# Patient Record
Sex: Male | Born: 1955
Health system: Southern US, Community
[De-identification: ages and names within clinical notes are randomized; demographics above are authoritative.]

## PROBLEM LIST (undated history)

## (undated) DIAGNOSIS — I10 Essential (primary) hypertension: Secondary | ICD-10-CM

## (undated) DIAGNOSIS — IMO0002 Reserved for concepts with insufficient information to code with codable children: Secondary | ICD-10-CM

## (undated) DIAGNOSIS — G4730 Sleep apnea, unspecified: Secondary | ICD-10-CM

## (undated) DIAGNOSIS — J449 Chronic obstructive pulmonary disease, unspecified: Secondary | ICD-10-CM

## (undated) DIAGNOSIS — Z9989 Dependence on other enabling machines and devices: Secondary | ICD-10-CM

## (undated) DIAGNOSIS — E119 Type 2 diabetes mellitus without complications: Secondary | ICD-10-CM

## (undated) DIAGNOSIS — E785 Hyperlipidemia, unspecified: Secondary | ICD-10-CM

## (undated) DIAGNOSIS — R42 Dizziness and giddiness: Secondary | ICD-10-CM

## (undated) HISTORY — DX: Hyperlipidemia, unspecified: E78.5

## (undated) HISTORY — DX: Reserved for concepts with insufficient information to code with codable children: IMO0002

## (undated) HISTORY — DX: Chronic obstructive pulmonary disease, unspecified: J44.9

---

## 2007-09-30 DIAGNOSIS — I152 Hypertension secondary to endocrine disorders: Secondary | ICD-10-CM | POA: Insufficient documentation

## 2007-10-10 DIAGNOSIS — R7301 Impaired fasting glucose: Secondary | ICD-10-CM | POA: Insufficient documentation

## 2008-01-15 ENCOUNTER — Inpatient Hospital Stay: Payer: Self-pay | Admitting: Internal Medicine

## 2008-01-30 ENCOUNTER — Ambulatory Visit: Payer: Self-pay | Admitting: Internal Medicine

## 2008-03-26 HISTORY — PX: SPINAL CORD STIMULATOR REMOVAL: SHX2423

## 2008-04-06 DIAGNOSIS — E1169 Type 2 diabetes mellitus with other specified complication: Secondary | ICD-10-CM | POA: Insufficient documentation

## 2011-09-25 ENCOUNTER — Ambulatory Visit: Payer: Self-pay | Admitting: Family Medicine

## 2011-09-25 DIAGNOSIS — E782 Mixed hyperlipidemia: Secondary | ICD-10-CM | POA: Diagnosis not present

## 2011-09-25 DIAGNOSIS — I10 Essential (primary) hypertension: Secondary | ICD-10-CM | POA: Diagnosis not present

## 2011-09-25 DIAGNOSIS — M5030 Other cervical disc degeneration, unspecified cervical region: Secondary | ICD-10-CM | POA: Diagnosis not present

## 2011-09-25 DIAGNOSIS — M542 Cervicalgia: Secondary | ICD-10-CM | POA: Diagnosis not present

## 2011-09-25 DIAGNOSIS — M47812 Spondylosis without myelopathy or radiculopathy, cervical region: Secondary | ICD-10-CM | POA: Diagnosis not present

## 2011-09-26 DIAGNOSIS — E782 Mixed hyperlipidemia: Secondary | ICD-10-CM | POA: Diagnosis not present

## 2011-09-26 DIAGNOSIS — E119 Type 2 diabetes mellitus without complications: Secondary | ICD-10-CM | POA: Diagnosis not present

## 2013-09-04 DIAGNOSIS — M6281 Muscle weakness (generalized): Secondary | ICD-10-CM | POA: Diagnosis not present

## 2013-09-04 DIAGNOSIS — I10 Essential (primary) hypertension: Secondary | ICD-10-CM | POA: Diagnosis not present

## 2013-09-04 DIAGNOSIS — G90529 Complex regional pain syndrome I of unspecified lower limb: Secondary | ICD-10-CM | POA: Diagnosis not present

## 2013-09-04 LAB — HEPATIC FUNCTION PANEL
ALT: 25 U/L (ref 10–40)
AST: 19 U/L (ref 14–40)
Alkaline Phosphatase: 109 U/L (ref 25–125)
Bilirubin, Total: 0.3 mg/dL

## 2013-09-04 LAB — TSH: TSH: 1.14 u[IU]/mL (ref 0.41–5.90)

## 2013-09-04 LAB — CBC AND DIFFERENTIAL
HCT: 49 % (ref 41–53)
Hemoglobin: 17.1 g/dL (ref 13.5–17.5)
NEUTROS ABS: 8 /uL
Platelets: 345 10*3/uL (ref 150–399)
WBC: 12.2 10*3/mL

## 2013-09-04 LAB — BASIC METABOLIC PANEL
BUN: 0 mg/dL — AB (ref 4–21)
Creatinine: 1 mg/dL (ref 0.6–1.3)
Glucose: 175 mg/dL
Potassium: 4.9 mmol/L (ref 3.4–5.3)
Sodium: 136 mmol/L — AB (ref 137–147)

## 2013-09-08 ENCOUNTER — Ambulatory Visit: Payer: Self-pay | Admitting: Family Medicine

## 2013-09-08 DIAGNOSIS — R51 Headache: Secondary | ICD-10-CM | POA: Diagnosis not present

## 2013-09-08 DIAGNOSIS — R209 Unspecified disturbances of skin sensation: Secondary | ICD-10-CM | POA: Diagnosis not present

## 2013-09-08 DIAGNOSIS — R930 Abnormal findings on diagnostic imaging of skull and head, not elsewhere classified: Secondary | ICD-10-CM | POA: Diagnosis not present

## 2013-09-08 DIAGNOSIS — R29898 Other symptoms and signs involving the musculoskeletal system: Secondary | ICD-10-CM | POA: Diagnosis not present

## 2013-09-17 DIAGNOSIS — M6281 Muscle weakness (generalized): Secondary | ICD-10-CM | POA: Diagnosis not present

## 2013-09-17 DIAGNOSIS — E119 Type 2 diabetes mellitus without complications: Secondary | ICD-10-CM | POA: Diagnosis not present

## 2013-09-17 DIAGNOSIS — I10 Essential (primary) hypertension: Secondary | ICD-10-CM | POA: Diagnosis not present

## 2013-10-02 ENCOUNTER — Ambulatory Visit: Payer: Self-pay | Admitting: Family Medicine

## 2013-10-09 DIAGNOSIS — M5412 Radiculopathy, cervical region: Secondary | ICD-10-CM | POA: Diagnosis not present

## 2013-10-09 DIAGNOSIS — E119 Type 2 diabetes mellitus without complications: Secondary | ICD-10-CM | POA: Diagnosis not present

## 2013-10-09 DIAGNOSIS — I10 Essential (primary) hypertension: Secondary | ICD-10-CM | POA: Diagnosis not present

## 2013-10-09 DIAGNOSIS — M6281 Muscle weakness (generalized): Secondary | ICD-10-CM | POA: Diagnosis not present

## 2014-01-01 DIAGNOSIS — Z23 Encounter for immunization: Secondary | ICD-10-CM | POA: Diagnosis not present

## 2014-01-01 DIAGNOSIS — G9050 Complex regional pain syndrome I, unspecified: Secondary | ICD-10-CM | POA: Diagnosis not present

## 2014-01-01 DIAGNOSIS — E782 Mixed hyperlipidemia: Secondary | ICD-10-CM | POA: Diagnosis not present

## 2014-01-01 DIAGNOSIS — M5412 Radiculopathy, cervical region: Secondary | ICD-10-CM | POA: Diagnosis not present

## 2014-01-01 DIAGNOSIS — I10 Essential (primary) hypertension: Secondary | ICD-10-CM | POA: Diagnosis not present

## 2014-01-01 DIAGNOSIS — E119 Type 2 diabetes mellitus without complications: Secondary | ICD-10-CM | POA: Diagnosis not present

## 2014-01-08 ENCOUNTER — Ambulatory Visit: Payer: Self-pay | Admitting: Family Medicine

## 2014-01-08 DIAGNOSIS — E782 Mixed hyperlipidemia: Secondary | ICD-10-CM | POA: Diagnosis not present

## 2014-01-08 DIAGNOSIS — I10 Essential (primary) hypertension: Secondary | ICD-10-CM | POA: Diagnosis not present

## 2014-01-08 DIAGNOSIS — M542 Cervicalgia: Secondary | ICD-10-CM | POA: Diagnosis not present

## 2014-01-08 DIAGNOSIS — E119 Type 2 diabetes mellitus without complications: Secondary | ICD-10-CM | POA: Diagnosis not present

## 2014-01-08 LAB — HEMOGLOBIN A1C: Hgb A1c MFr Bld: 7.7 % — AB (ref 4.0–6.0)

## 2014-01-08 LAB — LIPID PANEL
CHOLESTEROL: 206 mg/dL — AB (ref 0–200)
HDL: 33 mg/dL — AB (ref 35–70)
LDL CALC: 134 mg/dL
TRIGLYCERIDES: 194 mg/dL — AB (ref 40–160)

## 2014-09-07 ENCOUNTER — Other Ambulatory Visit: Payer: Self-pay | Admitting: Family Medicine

## 2014-09-07 DIAGNOSIS — E119 Type 2 diabetes mellitus without complications: Secondary | ICD-10-CM

## 2014-11-02 ENCOUNTER — Other Ambulatory Visit: Payer: Self-pay | Admitting: Family Medicine

## 2014-12-03 ENCOUNTER — Other Ambulatory Visit: Payer: Self-pay | Admitting: Family Medicine

## 2015-01-08 ENCOUNTER — Other Ambulatory Visit: Payer: Self-pay | Admitting: Family Medicine

## 2015-02-05 ENCOUNTER — Ambulatory Visit (INDEPENDENT_AMBULATORY_CARE_PROVIDER_SITE_OTHER): Payer: Medicare Other

## 2015-02-05 DIAGNOSIS — Z23 Encounter for immunization: Secondary | ICD-10-CM

## 2015-02-08 DIAGNOSIS — M542 Cervicalgia: Secondary | ICD-10-CM | POA: Insufficient documentation

## 2015-02-08 DIAGNOSIS — R040 Epistaxis: Secondary | ICD-10-CM | POA: Insufficient documentation

## 2015-02-08 DIAGNOSIS — E119 Type 2 diabetes mellitus without complications: Secondary | ICD-10-CM | POA: Insufficient documentation

## 2015-02-08 DIAGNOSIS — G90529 Complex regional pain syndrome I of unspecified lower limb: Secondary | ICD-10-CM | POA: Insufficient documentation

## 2015-02-08 DIAGNOSIS — M5412 Radiculopathy, cervical region: Secondary | ICD-10-CM | POA: Insufficient documentation

## 2015-02-10 ENCOUNTER — Other Ambulatory Visit: Payer: Self-pay | Admitting: Family Medicine

## 2015-02-11 ENCOUNTER — Ambulatory Visit (INDEPENDENT_AMBULATORY_CARE_PROVIDER_SITE_OTHER): Payer: Medicare Other | Admitting: Family Medicine

## 2015-02-11 ENCOUNTER — Encounter: Payer: Self-pay | Admitting: Family Medicine

## 2015-02-11 VITALS — BP 148/92 | HR 108 | Temp 97.6°F | Resp 20 | Wt 214.2 lb

## 2015-02-11 DIAGNOSIS — G9050 Complex regional pain syndrome I, unspecified: Secondary | ICD-10-CM

## 2015-02-11 DIAGNOSIS — E782 Mixed hyperlipidemia: Secondary | ICD-10-CM

## 2015-02-11 DIAGNOSIS — I10 Essential (primary) hypertension: Secondary | ICD-10-CM

## 2015-02-11 DIAGNOSIS — IMO0002 Reserved for concepts with insufficient information to code with codable children: Secondary | ICD-10-CM

## 2015-02-11 DIAGNOSIS — E1100 Type 2 diabetes mellitus with hyperosmolarity without nonketotic hyperglycemic-hyperosmolar coma (NKHHC): Secondary | ICD-10-CM | POA: Diagnosis not present

## 2015-02-11 NOTE — Progress Notes (Signed)
Patient ID: Patrick Grant, male   DOB: June 30, 1955, 59 y.o.   MRN: [id number]    Subjective:  Diabetes He presents for his follow-up diabetic visit. He has type 2 diabetes mellitus. His disease course has been stable. Pertinent negatives for hypoglycemia include no speech difficulty, sweats or tremors. Associated symptoms include visual change. Pertinent negatives for diabetes include no polydipsia, no polyuria and no weight loss. (Left eye poor vision after hospitalization for ketoacidosis. Has a history of reflex sympathetic dystrophy following an accident injuring the left foot.) Pertinent negatives for hypoglycemia complications include no blackouts. Symptoms are stable. His weight is decreasing steadily. He is following a diabetic diet. His breakfast blood glucose is taken between 7-8 am. His breakfast blood glucose range is generally 110-130 mg/dl. Eye exam is current.  Hypertension This is a chronic problem. The current episode started more than 1 year ago. The problem has been waxing and waning since onset. The problem is controlled. Pertinent negatives include no palpitations, shortness of breath or sweats. Risk factors for coronary artery disease include diabetes mellitus and male gender.     Prior to Admission medications   Medication Sig Start Date End Date Taking? Authorizing Provider  lisinopril (PRINIVIL,ZESTRIL) 10 MG tablet TAKE ONE TABLET BY MOUTH ONCE DAILY* DUE FOR BLOOD PRESSURE RECHECK IN THE NEXT 4-6 WEEKS* 02/10/15  Yes Vickki Muff Chrismon, PA  metFORMIN (GLUCOPHAGE) 500 MG tablet TAKE ONE TABLET BY MOUTH TWICE DAILY* NEEDS APPOINTMENT* 02/10/15  Yes Margo Common, PA   Past Surgical History  Procedure Laterality Date  . Spinal cord stimulator removal  2010    DUE TO LOSS OF MUSCLE USE WHEN ACTIVATED   Patient Active Problem List   Diagnosis Date Noted  . Cervical pain 02/08/2015  . Cervical nerve root disorder 02/08/2015  . Complex regional pain syndrome  02/08/2015  . Bleeding from the nose 02/08/2015  . Reflex sympathetic dystrophy of lower extremity 02/08/2015  . Type 2 diabetes mellitus ([place]) 02/08/2015  . Combined fat and carbohydrate induced hyperlipemia 04/06/2008  . Elevated fasting blood sugar 10/10/2007  . Essential (primary) hypertension 09/30/2007    History reviewed. No pertinent past medical history.  Social History   Social History  . Marital Status: Single    Spouse Name: N/A  . Number of Children: N/A  . Years of Education: N/A   Occupational History  . Not on file.   Social History Main Topics  . Smoking status: Current Every Day Smoker -- 25 years    Types: Cigars  . Smokeless tobacco: Never Used  . Alcohol Use: No  . Drug Use: No  . Sexual Activity: Not on file   Other Topics Concern  . Not on file   Social History Narrative    Allergies  Allergen Reactions  . Fentanyl     Other reaction(s): Other (See Comments) Other Reaction: Other reaction    Review of Systems  Constitutional: Negative.  Negative for weight loss.  HENT: Negative.   Eyes: Negative.   Respiratory: Negative.  Negative for shortness of breath.   Cardiovascular: Negative.  Negative for palpitations.  Gastrointestinal: Negative.   Genitourinary: Negative.   Musculoskeletal: Negative.   Skin: Negative.   Neurological: Negative for tremors and speech difficulty.       Continued chronic pain in left leg, foot and arm. Increased in pain to try to reach up to shoulder with the left arm or flex left knee. All the left side of body has hypersensitivity and  reacts to barometric changes in weather. Has been on multiple narcotic and pain medications in the past but decided to stop all of it due to side effects. Pain disturbing sleep now unless he uses a heating blanket.  Endo/Heme/Allergies: Negative.  Negative for polydipsia.  Psychiatric/Behavioral: Negative.     Immunization History  Administered Date(s) Administered  .  Influenza,inj,Quad PF,36+ Mos 02/05/2015   Objective:  BP 148/92 mmHg  Pulse 108  Temp(Src) 97.6 F (36.4 C) (Oral)  Resp 20  Wt 214 lb 3.2 oz (97.16 kg)  SpO2 96%  Physical Exam  Constitutional: He is oriented to person, place, and time and well-developed, well-nourished, and in no distress.  HENT:  Head: Normocephalic.  Eyes: Conjunctivae and EOM are normal.  Very little light and dark distinction in the vision of the left eye.  Neck:  Stiffness and decrease range of motion.  Cardiovascular: Normal rate and regular rhythm.   Pulmonary/Chest: Effort normal and breath sounds normal.  Abdominal: Bowel sounds are normal.  Musculoskeletal: He exhibits tenderness.  Hypersensitivity to skin of left side of body. Decreased ROM of the left arm and leg due to severe pain.  Neurological: He is alert and oriented to person, place, and time.  Skin:  Warm and palms sweat a great deal with history of RSD.  Psychiatric: Affect and judgment normal.    Lab Results  Component Value Date   WBC 12.2 09/04/2013   HGB 17.1 09/04/2013   HCT 49 09/04/2013   PLT 345 09/04/2013   CHOL 206* 01/08/2014   TRIG 194* 01/08/2014   HDL 33* 01/08/2014   LDLCALC 134 01/08/2014   TSH 1.14 09/04/2013   HGBA1C 7.7* 01/08/2014    CMP     Component Value Date/Time   NA 136* 09/04/2013   K 4.9 09/04/2013   BUN 0* 09/04/2013   CREATININE 1.0 09/04/2013   AST 19 09/04/2013   ALT 25 09/04/2013   ALKPHOS 109 09/04/2013    Assessment and Plan :  1. Type 2 diabetes mellitus with hyperosmolarity without coma, without long-term current use of insulin (HCC) Essentially good control on the Metformin 500 mg BID with diet. Unable to exercise any due to complex regional pain syndrome. Will get routine labs and follow up pending reports. - CBC with Differential/Platelet - COMPLETE METABOLIC PANEL WITH GFR - Hemoglobin A1c  2. Essential (primary) hypertension BP slightly elevated today with flare of  regional pain syndrome. Tolerating Lisinopril 10 mg qd without side effects. Recheck labs and follow up pending reports.  - TSH  3. Combined fat and carbohydrate induced hyperlipemia Tolerating Pravastatin without side effects. Trying to watch fats in diet. Recheck lipid panel and CMP. - Lipid panel  4. Complex regional pain syndrome Continues to have chronic pain in the left foot, leg and arm and hypersensitivity in the left side of his body that worsens with barometric changes in weather. Has been on multiple narcotic and pain medications in the past but stopped all of them due to side effect issues and feeling "drugged". Continues to use crutches for ambulation and is disabled from gainful employment.   [facility] 02/11/2015 11:08 AM

## 2015-02-13 ENCOUNTER — Other Ambulatory Visit: Payer: Self-pay | Admitting: Family Medicine

## 2015-02-21 ENCOUNTER — Other Ambulatory Visit: Payer: Self-pay | Admitting: Family Medicine

## 2015-02-21 DIAGNOSIS — I10 Essential (primary) hypertension: Secondary | ICD-10-CM | POA: Diagnosis not present

## 2015-02-21 DIAGNOSIS — E1100 Type 2 diabetes mellitus with hyperosmolarity without nonketotic hyperglycemic-hyperosmolar coma (NKHHC): Secondary | ICD-10-CM | POA: Diagnosis not present

## 2015-02-21 DIAGNOSIS — E782 Mixed hyperlipidemia: Secondary | ICD-10-CM | POA: Diagnosis not present

## 2015-02-21 NOTE — Telephone Encounter (Signed)
Patient needs refill on ONE TOUCH ULTRA TEST test strip   [facility] [address]

## 2015-02-21 NOTE — Telephone Encounter (Signed)
Requested received from [facility] requesting a new RX for One touch ultra blue test strip with ICD-10 code on it.

## 2015-02-22 LAB — LIPID PANEL
CHOL/HDL RATIO: 6.1 ratio — AB (ref 0.0–5.0)
CHOLESTEROL TOTAL: 212 mg/dL — AB (ref 100–199)
HDL: 35 mg/dL — ABNORMAL LOW (ref >39)
LDL Calculated: 148 mg/dL — ABNORMAL HIGH (ref 0–99)
TRIGLYCERIDES: 144 mg/dL (ref 0–149)
VLDL Cholesterol Cal: 29 mg/dL (ref 5–40)

## 2015-02-22 LAB — COMPREHENSIVE METABOLIC PANEL
ALT: 17 IU/L (ref 0–44)
AST: 18 IU/L (ref 0–40)
Albumin/Globulin Ratio: 1.8 (ref 1.1–2.5)
Albumin: 4.2 g/dL (ref 3.5–5.5)
Alkaline Phosphatase: 87 IU/L (ref 39–117)
BILIRUBIN TOTAL: 0.5 mg/dL (ref 0.0–1.2)
BUN/Creatinine Ratio: 12 (ref 9–20)
BUN: 12 mg/dL (ref 6–24)
CALCIUM: 9.7 mg/dL (ref 8.7–10.2)
CHLORIDE: 99 mmol/L (ref 97–106)
CO2: 24 mmol/L (ref 18–29)
Creatinine, Ser: 1 mg/dL (ref 0.76–1.27)
GFR calc non Af Amer: 83 mL/min/{1.73_m2} (ref >59)
GFR, EST AFRICAN AMERICAN: 96 mL/min/{1.73_m2} (ref >59)
GLUCOSE: 134 mg/dL — AB (ref 65–99)
Globulin, Total: 2.4 g/dL (ref 1.5–4.5)
POTASSIUM: 5.6 mmol/L — AB (ref 3.5–5.2)
Sodium: 138 mmol/L (ref 136–144)
TOTAL PROTEIN: 6.6 g/dL (ref 6.0–8.5)

## 2015-02-22 LAB — TSH: TSH: 1.31 u[IU]/mL (ref 0.450–4.500)

## 2015-02-22 LAB — CBC WITH DIFFERENTIAL/PLATELET
BASOS ABS: 0 10*3/uL (ref 0.0–0.2)
Basos: 0 %
EOS (ABSOLUTE): 0.1 10*3/uL (ref 0.0–0.4)
Eos: 1 %
Hematocrit: 46.4 % (ref 37.5–51.0)
Hemoglobin: 15.9 g/dL (ref 12.6–17.7)
Immature Grans (Abs): 0 10*3/uL (ref 0.0–0.1)
Immature Granulocytes: 0 %
LYMPHS ABS: 3 10*3/uL (ref 0.7–3.1)
Lymphs: 26 %
MCH: 31 pg (ref 26.6–33.0)
MCHC: 34.3 g/dL (ref 31.5–35.7)
MCV: 90 fL (ref 79–97)
MONOS ABS: 0.8 10*3/uL (ref 0.1–0.9)
Monocytes: 7 %
NEUTROS PCT: 66 %
Neutrophils Absolute: 7.7 10*3/uL — ABNORMAL HIGH (ref 1.4–7.0)
Platelets: 365 10*3/uL (ref 150–379)
RBC: 5.13 x10E6/uL (ref 4.14–5.80)
RDW: 13.3 % (ref 12.3–15.4)
WBC: 11.7 10*3/uL — ABNORMAL HIGH (ref 3.4–10.8)

## 2015-02-22 LAB — HEMOGLOBIN A1C
ESTIMATED AVERAGE GLUCOSE: 160 mg/dL
HEMOGLOBIN A1C: 7.2 % — AB (ref 4.8–5.6)

## 2015-02-22 MED ORDER — GLUCOSE BLOOD VI STRP: ORAL_STRIP | Status: DC

## 2015-02-22 NOTE — Telephone Encounter (Signed)
Copy printed with ICD-10 code on it. Please fax to the pharmacy.

## 2015-02-23 NOTE — Telephone Encounter (Signed)
RX faxed to [place] [facility] at [phone].

## 2015-02-25 ENCOUNTER — Telehealth: Payer: Self-pay

## 2015-02-25 DIAGNOSIS — E785 Hyperlipidemia, unspecified: Secondary | ICD-10-CM

## 2015-02-25 MED ORDER — PRAVASTATIN SODIUM 20 MG PO TABS: 20.0000 mg | ORAL_TABLET | Freq: Every day | ORAL | Status: DC

## 2015-02-25 NOTE — Telephone Encounter (Signed)
Patient advised as directed below. Patient verbalized understanding and agrees with plan of care. RX sent to [facility]. Patient will call back to schedule a follow up appointment.

## 2015-02-25 NOTE — Telephone Encounter (Signed)
-----   Message from [employer], [place] sent at 02/25/2015  9:04 AM EST ----- Hgb A1C slightly better than last year. Goal is to get below 7.0 for best control. Continue diabetic diet and Metformin. Normal liver and kidney tests. Potassium slightly up - should drink extra fluids/water. Total and LDL cholesterol high and need Pravastatin 20 mg qd #30 & 3 RF. Recent guidelines encourages use of statins in all diabetics to prevent heart attacks (even without elevation of cholesterol). Recheck for improvement in 3 months.

## 2015-03-10 ENCOUNTER — Other Ambulatory Visit: Payer: Self-pay | Admitting: Family Medicine

## 2015-07-05 ENCOUNTER — Other Ambulatory Visit: Payer: Self-pay | Admitting: Family Medicine

## 2015-08-06 ENCOUNTER — Other Ambulatory Visit: Payer: Self-pay | Admitting: Family Medicine

## 2015-09-08 ENCOUNTER — Other Ambulatory Visit: Payer: Self-pay | Admitting: Family Medicine

## 2015-11-08 ENCOUNTER — Other Ambulatory Visit: Payer: Self-pay | Admitting: Family Medicine

## 2015-12-08 ENCOUNTER — Other Ambulatory Visit: Payer: Self-pay | Admitting: Family Medicine

## 2016-01-08 ENCOUNTER — Other Ambulatory Visit: Payer: Self-pay | Admitting: Family Medicine

## 2016-01-31 ENCOUNTER — Ambulatory Visit (INDEPENDENT_AMBULATORY_CARE_PROVIDER_SITE_OTHER): Payer: Medicare Other | Admitting: Family Medicine

## 2016-01-31 ENCOUNTER — Encounter: Payer: Medicare Other | Admitting: Family Medicine

## 2016-01-31 VITALS — BP 131/86 | HR 84 | Temp 98.0°F | Ht 72.0 in | Wt 218.0 lb

## 2016-01-31 DIAGNOSIS — Z0000 Encounter for general adult medical examination without abnormal findings: Secondary | ICD-10-CM

## 2016-01-31 DIAGNOSIS — Z23 Encounter for immunization: Secondary | ICD-10-CM | POA: Diagnosis not present

## 2016-01-31 NOTE — Progress Notes (Signed)
Subjective:   Patrick Grant is a 60 y.o. male who presents for Medicare Annual/Subsequent preventive examination.  Review of Systems:  N/A Cardiac Risk Factors include: advanced age (>43men, >55 women);diabetes mellitus;dyslipidemia;hypertension;male gender;obesity (BMI >30kg/m2)     Objective:    Vitals: BP 122/84 (BP Location: Right Arm)   Pulse 84   Temp 98 F (36.7 C) (Oral)   Ht 6' (1.829 m)   Wt 218 lb (98.9 kg)   BMI 29.57 kg/m   Body mass index is 29.57 kg/m.  Tobacco History  Smoking Status  . Current Every Day Smoker  . Years: 25.00  . Types: Cigars  Smokeless Tobacco  . Never Used    Comment: 6-7 cigars/day     Ready to quit: Not Answered Counseling given: Not Answered   History reviewed. No pertinent past medical history. Past Surgical History:  Procedure Laterality Date  . SPINAL CORD STIMULATOR REMOVAL  2010   DUE TO LOSS OF MUSCLE USE WHEN ACTIVATED   Family History  Problem Relation Age of Onset  . CVA Mother   . Kidney failure Mother   . CVA Father   . Kidney failure Father   . Colon cancer Father   . Ovarian cancer Sister   . Diabetes Brother   . Healthy Sister    History  Sexual Activity  . Sexual activity: Not on file    Outpatient Encounter Prescriptions as of 01/31/2016  Medication Sig  . glucose blood (ONE TOUCH ULTRA TEST) test strip USE ONE STRIP TO CHECK FASTING BLOOD SUGAR ONCE DAILY  . lisinopril (PRINIVIL,ZESTRIL) 10 MG tablet TAKE ONE TABLET BY MOUTH ONCE DAILY  . metFORMIN (GLUCOPHAGE) 500 MG tablet TAKE ONE TABLET BY MOUTH TWICE DAILY WITH MEALS *DUE  FOR  FOLLOW  UP  APPOINTMENT*  . pravastatin (PRAVACHOL) 20 MG tablet TAKE ONE TABLET BY MOUTH ONCE DAILY   No facility-administered encounter medications on file as of 01/31/2016.     Activities of Daily Living In your present state of health, do you have any difficulty performing the following activities: 01/31/2016  Hearing? Y  Vision? Y  Difficulty  concentrating or making decisions? Y  Walking or climbing stairs? Y  Dressing or bathing? N  Doing errands, shopping? Y  Preparing Food and eating ? N  Using the Toilet? N  In the past six months, have you accidently leaked urine? N  Do you have problems with loss of bowel control? N  Managing your Medications? Y  Managing your Finances? Y  Housekeeping or managing your Housekeeping? Y  Some recent data might be hidden    Patient Care Team: Margo Common, PA as PCP - General (Family Medicine)   Assessment:     Exercise Activities and Dietary recommendations Current Exercise Habits: The patient does not participate in regular exercise at present, Exercise limited by: orthopedic condition(s)  Goals    . Increase water intake          Starting 01/31/16, I will continue to drink 6-8 glasses a day.       Fall Risk Fall Risk  01/31/2016  Falls in the past year? Yes  Number falls in past yr: 2 or more  Injury with Fall? No   Depression Screen PHQ 2/9 Scores 01/31/2016  PHQ - 2 Score 4  PHQ- 9 Score 12    Cognitive Function     6CIT Screen 01/31/2016  What Year? 0 points  What month? 3 points  What time? 0  points  Count back from 20 0 points  Months in reverse 4 points  Repeat phrase 6 points  Total Score 13    Immunization History  Administered Date(s) Administered  . Influenza,inj,Quad PF,36+ Mos 02/05/2015   Screening Tests Health Maintenance  Topic Date Due  . Hepatitis C Screening  07-May-1955  . PNEUMOCOCCAL POLYSACCHARIDE VACCINE (1) 11/30/1959  . FOOT EXAM  11/30/1967  . OPHTHALMOLOGY EXAM  11/30/1967  . HEMOGLOBIN A1C  08/21/2015  . COLONOSCOPY  01/30/2017 (Originally 11/30/2007)  . TETANUS/TDAP  01/30/2017 (Originally 11/29/1976)  . HIV Screening  01/30/2017 (Originally 11/29/1972)  . INFLUENZA VACCINE  Completed      Plan:  I have personally reviewed and addressed the Medicare Annual Wellness questionnaire and have noted the following in the  patient's chart:  A. Medical and social history B. Use of alcohol, tobacco or illicit drugs  C. Current medications and supplements D. Functional ability and status E.  Nutritional status F.  Physical activity G. Advance directives H. List of other physicians I.  Hospitalizations, surgeries, and ER visits in previous 12 months J.  [place] such as hearing and vision if needed, cognitive and depression L. Referrals and appointments - none  In addition, I have reviewed and discussed with patient certain preventive protocols, quality metrics, and best practice recommendations. A written personalized care plan for preventive services as well as general preventive health recommendations were provided to patient.  See attached scanned questionnaire for additional information.   Signed,  Fabio Neighbors, LPN Nurse Health Advisor  Dr Recommendations: follow up on depression screening, foot exam and blood work needed.  I have reviewed the health advisor's note, was available for consultation and agree with documentation and plan.

## 2016-01-31 NOTE — Patient Instructions (Signed)
Patrick Grant , Thank you for taking time to come for your Medicare Wellness Visit. I appreciate your ongoing commitment to your health goals. Please review the following plan we discussed and let me know if I can assist you in the future.   These are the goals we discussed: Goals    . Increase water intake          Starting 01/31/16, I will continue to drink 6-8 glasses a day.        This is a list of the screening recommended for you and due dates:  Health Maintenance  Topic Date Due  .  Hepatitis C: One time screening is recommended by Center for Disease Control  (CDC) for  adults born from [birth year] through 1963.   November 02, 1955  . Pneumococcal vaccine (1) 11/30/1959  . Complete foot exam   11/30/1967  . Eye exam for diabetics  11/30/1967  . Hemoglobin A1C  08/21/2015  . Colon Cancer Screening  01/30/2017*  . Tetanus Vaccine  01/30/2017*  . HIV Screening  01/30/2017*  . Flu Shot  Completed  *Topic was postponed. The date shown is not the original due date.   Preventive Care for Adults  A healthy lifestyle and preventive care can promote health and wellness. Preventive health guidelines for adults include the following key practices.  . A routine yearly physical is a good way to check with your health care provider about your health and preventive screening. It is a chance to share any concerns and updates on your health and to receive a thorough exam.  . Visit your dentist for a routine exam and preventive care every 6 months. Brush your teeth twice a day and floss once a day. Good oral hygiene prevents tooth decay and gum disease.  . The frequency of eye exams is based on your age, health, family medical history, use  of contact lenses, and other factors. Follow your health care provider's ecommendations for frequency of eye exams.  . Eat a healthy diet. Foods like vegetables, fruits, whole grains, low-fat dairy products, and lean protein foods contain the nutrients you need without  too many calories. Decrease your intake of foods high in solid fats, added sugars, and salt. Eat the right amount of calories for you. Get information about a proper diet from your health care provider, if necessary.  . Regular physical exercise is one of the most important things you can do for your health. Most adults should get at least 150 minutes of moderate-intensity exercise (any activity that increases your heart rate and causes you to sweat) each week. In addition, most adults need muscle-strengthening exercises on 2 or more days a week.  Silver Sneakers may be a benefit available to you. To determine eligibility, you may visit the website: www.silversneakers.com or contact program at [phone] Mon-Fri between 8AM-8PM.   . Maintain a healthy weight. The body mass index (BMI) is a screening tool to identify possible weight problems. It provides an estimate of body fat based on height and weight. Your health care provider can find your BMI and can help you achieve or maintain a healthy weight.   For adults 20 years and older: ? A BMI below 18.5 is considered underweight. ? A BMI of 18.5 to 24.9 is normal. ? A BMI of 25 to 29.9 is considered overweight. ? A BMI of 30 and above is considered obese.   . Maintain normal blood lipids and cholesterol levels by exercising and minimizing your intake of  saturated fat. Eat a balanced diet with plenty of fruit and vegetables. Blood tests for lipids and cholesterol should begin at age 65 and be repeated every 5 years. If your lipid or cholesterol levels are high, you are over 50, or you are at high risk for heart disease, you may need your cholesterol levels checked more frequently. Ongoing high lipid and cholesterol levels should be treated with medicines if diet and exercise are not working.  . If you smoke, find out from your health care provider how to quit. If you do not use tobacco, please do not start.  . If you choose to drink alcohol,  please do not consume more than 2 drinks per day. One drink is considered to be 12 ounces (355 mL) of beer, 5 ounces (148 mL) of wine, or 1.5 ounces (44 mL) of liquor.  . If you are 43-32 years old, ask your health care provider if you should take aspirin to prevent strokes.  . Use sunscreen. Apply sunscreen liberally and repeatedly throughout the day. You should seek shade when your shadow is shorter than you. Protect yourself by wearing long sleeves, pants, a wide-brimmed hat, and sunglasses year round, whenever you are outdoors.  . Once a month, do a whole body skin exam, using a mirror to look at the skin on your back. Tell your health care provider of new moles, moles that have irregular borders, moles that are larger than a pencil eraser, or moles that have changed in shape or color.

## 2016-01-31 NOTE — Progress Notes (Deleted)
   Patient: Patrick Grant Male    DOB: Sep 02, 1955   60 y.o.   MRN: [id number] Visit Date: 01/31/2016  Today's Provider: Vernie Murders, PA   Chief Complaint  Patient presents with  . Hyperlipidemia  . Hypertension  . Diabetes  . Follow-up   Subjective:    HPI  Diabetes Mellitus Type II, Follow-up:   Lab Results  Component Value Date   HGBA1C 7.2 (H) 02/21/2015   HGBA1C 7.7 (A) 01/08/2014   Last seen for diabetes 1 years ago.  Management since then includes continue Metformin. He reports excellent compliance with treatment. He is not having side effects.  Current symptoms include none and have been stable. Home blood sugar records: {diabetes glucometry results:16657}  Episodes of hypoglycemia? {yes***/no:17258}   Current Insulin Regimen: none Weight trend: stable Current diet: {diet habits:16563} Current exercise: {exercise types:16438}  ------------------------------------------------------------------------   Hypertension, follow-up:  BP Readings from Last 3 Encounters:  01/31/16 122/84  02/11/15 (!) 148/92  01/01/14 (!) 150/92    He was last seen for hypertension 1 years ago.  BP at that visit was 148/92. Management since that visit includes continue Lisinopril.He reports excellent compliance with treatment. He is not having side effects.  He {is/is not:9024} exercising. He {is/is not:9024} adherent to low salt diet.   Outside blood pressures are ***. He is experiencing none.  Patient denies chest pain, irregular heart beat and palpitations.   Cardiovascular risk factors include advanced age (older than 76 for men, 25 for women), diabetes mellitus, dyslipidemia, hypertension and male gender.  Use of agents associated with hypertension: none.   ------------------------------------------------------------------------    Lipid/Cholesterol, Follow-up:   Last seen for this 1 years ago.  Management since that visit includes continue  Pravastatin.  Last Lipid Panel:    Component Value Date/Time   CHOL 212 (H) 02/21/2015 1000   TRIG 144 02/21/2015 1000   HDL 35 (L) 02/21/2015 1000   CHOLHDL 6.1 (H) 02/21/2015 1000   LDLCALC 148 (H) 02/21/2015 1000    He reports excellent compliance with treatment. He is not having side effects.   Wt Readings from Last 3 Encounters:  01/31/16 218 lb (98.9 kg)  02/11/15 214 lb 3.2 oz (97.2 kg)  01/01/14 224 lb (101.6 kg)    ------------------------------------------------------------------------    Previous Medications   GLUCOSE BLOOD (ONE TOUCH ULTRA TEST) TEST STRIP    USE ONE STRIP TO CHECK FASTING BLOOD SUGAR ONCE DAILY   LISINOPRIL (PRINIVIL,ZESTRIL) 10 MG TABLET    TAKE ONE TABLET BY MOUTH ONCE DAILY   METFORMIN (GLUCOPHAGE) 500 MG TABLET    TAKE ONE TABLET BY MOUTH TWICE DAILY WITH MEALS *DUE  FOR  FOLLOW  UP  APPOINTMENT*   PRAVASTATIN (PRAVACHOL) 20 MG TABLET    TAKE ONE TABLET BY MOUTH ONCE DAILY    Review of Systems  Constitutional: Negative.   Respiratory: Negative.   Cardiovascular: Negative.   Gastrointestinal: Negative.   Endocrine: Negative.   Musculoskeletal: Negative.     Social History  Substance Use Topics  . Smoking status: Current Every Day Smoker    Years: 25.00    Types: Cigars  . Smokeless tobacco: Never Used     Comment: 6-7 cigars/day  . Alcohol use No   Objective:   There were no vitals taken for this visit.  Physical Exam      Assessment & Plan:       Follow up: No Follow-up on file.

## 2016-02-08 ENCOUNTER — Other Ambulatory Visit: Payer: Self-pay | Admitting: Family Medicine

## 2016-02-09 ENCOUNTER — Encounter: Payer: Self-pay | Admitting: Family Medicine

## 2016-02-09 ENCOUNTER — Ambulatory Visit (INDEPENDENT_AMBULATORY_CARE_PROVIDER_SITE_OTHER): Payer: Medicare Other | Admitting: Family Medicine

## 2016-02-09 VITALS — BP 132/98 | HR 94 | Temp 98.2°F | Resp 16 | Wt 218.2 lb

## 2016-02-09 DIAGNOSIS — G90522 Complex regional pain syndrome I of left lower limb: Secondary | ICD-10-CM

## 2016-02-09 DIAGNOSIS — E1100 Type 2 diabetes mellitus with hyperosmolarity without nonketotic hyperglycemic-hyperosmolar coma (NKHHC): Secondary | ICD-10-CM

## 2016-02-09 DIAGNOSIS — I10 Essential (primary) hypertension: Secondary | ICD-10-CM

## 2016-02-09 DIAGNOSIS — E782 Mixed hyperlipidemia: Secondary | ICD-10-CM | POA: Diagnosis not present

## 2016-02-09 LAB — POCT GLYCOSYLATED HEMOGLOBIN (HGB A1C): Hemoglobin A1C: 7.1

## 2016-02-09 NOTE — Progress Notes (Signed)
Patient: Patrick Grant Male    DOB: Dec 09, 1955   60 y.o.   MRN: [id number] Visit Date: 02/09/2016  Today's Provider: Vernie Murders, PA   Chief Complaint  Patient presents with  . Hyperlipidemia  . Hypertension  . Diabetes  . Follow-up   Subjective:    HPI  Diabetes Mellitus Type II, Follow-up:   Lab Results  Component Value Date   HGBA1C 7.2 (H) 02/21/2015   HGBA1C 7.7 (A) 01/08/2014   Last seen for diabetes 1 years ago.  Management since then includes continue medication and watch fats in diet. He reports good compliance with treatment. He is not having side effects.  Current symptoms include none and have been stable. Home blood sugar records: not being checked  Episodes of hypoglycemia? Unknown   Current Insulin Regimen: none Weight trend: stable Current diet: in general, a "healthy" diet   Current exercise: none  ------------------------------------------------------------------------   Hypertension, follow-up:  BP Readings from Last 3 Encounters:  02/09/16 (!) 132/98  01/31/16 131/86  02/11/15 (!) 148/92    He was last seen for hypertension 1 years ago.  BP at that visit was 148/92. Management since that visit includes continue medication.He reports good compliance with treatment. He is not having side effects.  He is not exercising. He is not adherent to low salt diet.   Outside blood pressures are not being checked. He is experiencing none.  Patient denies chest pain, irregular heart beat and palpitations.   Cardiovascular risk factors include advanced age (older than 57 for men, 59 for women), diabetes mellitus, dyslipidemia, male gender, obesity (BMI >= 30 kg/m2) and smoking/ tobacco exposure.  Use of agents associated with hypertension: none.   ------------------------------------------------------------------------    Lipid/Cholesterol, Follow-up:   Last seen for this 1 years ago.  Management since that visit includes continue  medication.  Last Lipid Panel:    Component Value Date/Time   CHOL 212 (H) 02/21/2015 1000   TRIG 144 02/21/2015 1000   HDL 35 (L) 02/21/2015 1000   CHOLHDL 6.1 (H) 02/21/2015 1000   LDLCALC 148 (H) 02/21/2015 1000    He reports good compliance with treatment. He is not having side effects.   Wt Readings from Last 3 Encounters:  02/09/16 218 lb 3.2 oz (99 kg)  01/31/16 218 lb (98.9 kg)  02/11/15 214 lb 3.2 oz (97.2 kg)    ------------------------------------------------------------------------  Patient Active Problem List   Diagnosis Date Noted  . Cervical pain 02/08/2015  . Cervical nerve root disorder 02/08/2015  . Complex regional pain syndrome 02/08/2015  . Bleeding from the nose 02/08/2015  . Reflex sympathetic dystrophy of lower extremity 02/08/2015  . Type 2 diabetes mellitus ([place]) 02/08/2015  . Combined fat and carbohydrate induced hyperlipemia 04/06/2008  . Elevated fasting blood sugar 10/10/2007  . Essential (primary) hypertension 09/30/2007   Past Surgical History:  Procedure Laterality Date  . SPINAL CORD STIMULATOR REMOVAL  2010   DUE TO LOSS OF MUSCLE USE WHEN ACTIVATED   Family History  Problem Relation Age of Onset  . CVA Mother   . Kidney failure Mother   . CVA Father   . Kidney failure Father   . Colon cancer Father   . Ovarian cancer Sister   . Diabetes Brother   . Healthy Sister    Allergies  Allergen Reactions  . Fentanyl     Other reaction(s): Other (See Comments) Other Reaction: Other reaction     Previous Medications   GLUCOSE BLOOD (ONE TOUCH  ULTRA TEST) TEST STRIP    USE ONE STRIP TO CHECK FASTING BLOOD SUGAR ONCE DAILY   LISINOPRIL (PRINIVIL,ZESTRIL) 10 MG TABLET    TAKE ONE TABLET BY MOUTH ONCE DAILY   METFORMIN (GLUCOPHAGE) 500 MG TABLET    TAKE ONE TABLET BY MOUTH TWICE DAILY WITH MEALS *DUE  FOR  FOLLOW  UP  APPOINTMENT*   PRAVASTATIN (PRAVACHOL) 20 MG TABLET    TAKE ONE TABLET BY MOUTH ONCE DAILY    Review of Systems    Constitutional: Negative.   Respiratory: Negative.   Cardiovascular: Negative.   Endocrine: Negative.   Musculoskeletal: Positive for arthralgias, gait problem and myalgias.    Social History  Substance Use Topics  . Smoking status: Current Every Day Smoker    Years: 25.00    Types: Cigars  . Smokeless tobacco: Never Used     Comment: 6-7 cigars/day  . Alcohol use No   Objective:   BP (!) 132/98 (BP Location: Right Arm, Patient Position: Sitting, Cuff Size: Normal)   Pulse 94   Temp 98.2 F (36.8 C) (Oral)   Resp 16   Wt 218 lb 3.2 oz (99 kg)   BMI 29.59 kg/m   Physical Exam  Constitutional: He is oriented to person, place, and time. He appears well-developed and well-nourished. No distress.  HENT:  Head: Normocephalic and atraumatic.  Right Ear: Hearing normal.  Left Ear: Hearing normal.  Nose: Nose normal.  Eyes: Conjunctivae and lids are normal. Right eye exhibits no discharge. Left eye exhibits no discharge. No scleral icterus.  Neck: Neck supple.  Cardiovascular: Normal rate and regular rhythm.   Pulmonary/Chest: Effort normal and breath sounds normal. No respiratory distress.  Abdominal: Bowel sounds are normal.  Musculoskeletal: Normal range of motion. He exhibits tenderness.  Severe pain in the left foot from complex regional pain syndrome for years. Foot will sweat easily and over react to minor stimuli.  Neurological: He is alert and oriented to person, place, and time.  Skin: Skin is intact. No lesion and no rash noted.  Psychiatric: He has a normal mood and affect. His speech is normal and behavior is normal. Thought content normal.      Assessment & Plan:     1. Type 2 diabetes mellitus with hyperosmolarity without coma, without long-term current use of insulin ([place]) Diagnosed in 2009 with one incident of ketoacidosis hospitalization. Presently Hgb A1C 7.1% and tolerating Metformin 500 mg BID with low fat diabetic diet. Will recheck labs and follow up  pending reports. - POCT HgB A1C - CBC with Differential/Platelet; Future - Comprehensive metabolic panel; Future - Lipid panel; Future  2. Essential (primary) hypertension Diagnosed in 2009 and fairly well controlled with Lisinopril 10 mg qd. Recheck labs and follow up pending reports. - CBC with Differential/Platelet; Future - Comprehensive metabolic panel; Future - Lipid panel; Future - TSH; Future  3. Complex regional pain syndrome type 1 of left lower extremity Diagnosed with RSD in 2008 following a fracture of the left foot. Has to use crutches to walk due to constant severe pain in the foot. Unable to wear shoes. Was on heavy duty narcotic analgesics in the past but decided to stop them due to fear of addiction.   4. Combined fat and carbohydrate induced hyperlipemia Started Pravastatin 1 year ago. Will check follow up labs and encouraged to continue Pravastatin 20 mg qd regularly. - Comprehensive metabolic panel; Future - Lipid panel; Future - TSH; Future

## 2016-03-16 ENCOUNTER — Ambulatory Visit (INDEPENDENT_AMBULATORY_CARE_PROVIDER_SITE_OTHER): Payer: Medicare Other | Admitting: Physician Assistant

## 2016-03-16 ENCOUNTER — Encounter: Payer: Self-pay | Admitting: Physician Assistant

## 2016-03-16 VITALS — BP 156/98 | HR 128 | Temp 97.8°F | Resp 16 | Wt 218.0 lb

## 2016-03-16 DIAGNOSIS — G90522 Complex regional pain syndrome I of left lower limb: Secondary | ICD-10-CM | POA: Diagnosis not present

## 2016-03-16 DIAGNOSIS — E1100 Type 2 diabetes mellitus with hyperosmolarity without nonketotic hyperglycemic-hyperosmolar coma (NKHHC): Secondary | ICD-10-CM | POA: Diagnosis not present

## 2016-03-16 DIAGNOSIS — N50819 Testicular pain, unspecified: Secondary | ICD-10-CM

## 2016-03-16 LAB — POCT URINALYSIS DIPSTICK
Bilirubin, UA: NEGATIVE
Blood, UA: NEGATIVE
Glucose, UA: NEGATIVE
Nitrite, UA: NEGATIVE
Protein, UA: NEGATIVE
Spec Grav, UA: 1.01
Urobilinogen, UA: 0.2
pH, UA: 6.5

## 2016-03-16 NOTE — Progress Notes (Addendum)
Patient: Patrick Grant Male    DOB: 1956/02/27   60 y.o.   MRN: [id number] Visit Date: 03/16/2016  Today's Provider: Trinna Post, PA-C   Chief Complaint  Patient presents with  . Abdominal Pain    Lower right side.   Subjective:    Abdominal Pain  This is a new problem. The current episode started in the past 7 days. The problem occurs every several days. The problem has been gradually worsening. The pain is located in the RLQ. The quality of the pain is dull. The abdominal pain does not radiate. Pertinent negatives include no constipation, diarrhea, fever, nausea or vomiting. The pain is aggravated by movement. The pain is relieved by nothing. He has tried nothing for the symptoms.   Patient is a 60 y/o male with diabetes Type II on metformin and complex regional pain syndrome in his left leg presenting today with multiple complaints. First, he is saying that he hasn't felt right. He relates this to his right sided groin pain, which began two months ago when his large dog ran into his groin. At that time his right testicle hurt for a few days but went away. He reports he had not experienced this pain until 5 days ago, when it resurfaced in his right testicle and right groin area. He is not experiencing any urinary frequency. Denies any bowel problems. No exposure to sexually transmitted infections.    He also reports he feels like he has a "knot that shouldn't be here" in his RLQ when he bends over. He is concerned that there is an infection going on, since he has been having hot flashes and his palms and hands have been sweaty. He also reports he has been getting dizzy lately. He reports the last time he felt like this he ended up in the hospital with a blood sugar > 900. He reports he hasn't checked his blood sugar in over two weeks. In office fingerstick blood sugar today is 157.  He denies running fevers or sudden unexpected weight loss. Denies nausea or vomiting. Denies  abdominal pain or back pain elsewhere. He has chronic issues with his left leg with CRPS. He does not take medications for this. He ambulated with the help of crutches. He says he is concerned ever since problems with his leg happened that anything off in his health would be serious.  He is also asking about labs that had been ordered when he was last seen. He has not gotten them yet and wants to know if he can get them.      Allergies  Allergen Reactions  . Fentanyl     Other reaction(s): Other (See Comments) Other Reaction: Other reaction     Current Outpatient Prescriptions:  .  glucose blood (ONE TOUCH ULTRA TEST) test strip, USE ONE STRIP TO CHECK FASTING BLOOD SUGAR ONCE DAILY, Disp: 100 each, Rfl: 3 .  lisinopril (PRINIVIL,ZESTRIL) 10 MG tablet, TAKE ONE TABLET BY MOUTH ONCE DAILY, Disp: 30 tablet, Rfl: 6 .  metFORMIN (GLUCOPHAGE) 500 MG tablet, Take 1 tablet (500 mg total) by mouth 2 (two) times daily with a meal., Disp: 60 tablet, Rfl: 6 .  pravastatin (PRAVACHOL) 20 MG tablet, TAKE ONE TABLET BY MOUTH ONCE DAILY, Disp: 30 tablet, Rfl: 6  Review of Systems  Constitutional: Positive for diaphoresis and fatigue. Negative for activity change, appetite change, chills, fever and unexpected weight change.  Gastrointestinal: Positive for abdominal pain. Negative for abdominal  distention, anal bleeding, blood in stool, constipation, diarrhea, nausea, rectal pain and vomiting.  Endocrine: Positive for heat intolerance.  Genitourinary: Positive for testicular pain (Right testicular pain.  He reports a dog jumped on him. ).  Musculoskeletal: Negative.     Social History  Substance Use Topics  . Smoking status: Current Every Day Smoker    Years: 25.00    Types: Cigars  . Smokeless tobacco: Never Used     Comment: 6-7 cigars/day  . Alcohol use No   Objective:   BP (!) 156/98 (BP Location: Right Arm, Patient Position: Sitting, Cuff Size: Large)   Pulse (!) 128   Temp 97.8 F (36.6  C) (Oral)   Resp 16   Wt 218 lb (98.9 kg)   BMI 29.57 kg/m   Physical Exam  Constitutional: He is oriented to person, place, and time. He appears well-developed and well-nourished. No distress.  Pulmonary/Chest: Effort normal.  Abdominal: Soft. Bowel sounds are normal. He exhibits no distension and no mass. There is no tenderness. There is no rebound and no guarding. Hernia confirmed negative in the right inguinal area and confirmed negative in the left inguinal area.  Genitourinary: Penis normal. Right testis shows tenderness. Right testis shows no mass and no swelling. Right testis is descended. Cremasteric reflex is not absent on the right side. Left testis shows no mass, no swelling and no tenderness. Left testis is descended. Cremasteric reflex is not absent on the left side. Circumcised. No phimosis, paraphimosis, penile erythema or penile tenderness.  Genitourinary Comments: No pain relief upon elevation of testes. No swelling or tenderness evident in epididymis.   Lymphadenopathy:       Right: No inguinal adenopathy present.       Left: No inguinal adenopathy present.  Neurological: He is alert and oriented to person, place, and time.  Skin: Skin is warm and dry. He is not diaphoretic.        Assessment & Plan:     1. Testicular pain  Unclear etiology of pain, probably traumatic. No evidence of testicular torsion today on exam, no evidence of indirect or direct hernia. Urinalysis normal. Will evaluate with ultrasound. Did not appreciate "knot" on today's exam, suspect it might be direct hernia, but does not sound like it is being strangulated. With patient's multiple list of complaints, this can be addressed at his follow up visit.  - US Scrotum; Future - [place] Art/Ven Flow Abd Pelv Doppler; Future - POCT urinalysis dipstick  2. Type 2 diabetes mellitus with hyperosmolarity without coma, without long-term current use of insulin ([place])  In office blood sugar today 157. Advised  patient that he needs to at least check his fasting sugars daily. Reinforced adherence to medications. [name] ordered labs that pt can get at [facility] when he is fasting.  3. Complex regional pain syndrome type 1 of left lower extremity  Seems stable, pt ambulating with crutches and deals with pain without medications.   I have spent 25 minutes with this patient, >50% of which was spent on counseling and coordination of care.  Return in about 2 weeks (around 03/30/2016), or With [facility] for right sided pain.  The entirety of the information documented in the History of Present Illness, Review of Systems and Physical Exam were personally obtained by me. Portions of this information were initially documented by Ashley Royalty, CMA and reviewed by me for thoroughness and accuracy.    Patient Instructions  Abdominal Pain, Adult Many things can cause belly (abdominal) pain.  Most times, belly pain is not dangerous. Many cases of belly pain can be watched and treated at home. Sometimes belly pain is serious, though. Your doctor will try to find the cause of your belly pain. Follow these instructions at home:  Take over-the-counter and prescription medicines only as told by your doctor. Do not take medicines that help you poop (laxatives) unless told to by your doctor.  Drink enough fluid to keep your pee (urine) clear or pale yellow.  Watch your belly pain for any changes.  Keep all follow-up visits as told by your doctor. This is important. Contact a doctor if:  Your belly pain changes or gets worse.  You are not hungry, or you lose weight without trying.  You are having trouble pooping (constipated) or have watery poop (diarrhea) for more than 2-3 days.  You have pain when you pee or poop.  Your belly pain wakes you up at night.  Your pain gets worse with meals, after eating, or with certain foods.  You are throwing up and cannot keep anything down.  You have a fever. Get help right  away if:  Your pain does not go away as soon as your doctor says it should.  You cannot stop throwing up.  Your pain is only in areas of your belly, such as the right side or the left lower part of the belly.  You have bloody or black poop, or poop that looks like tar.  You have very bad pain, cramping, or bloating in your belly.  You have signs of not having enough fluid or water in your body (dehydration), such as:  Dark pee, very little pee, or no pee.  Cracked lips.  Dry mouth.  Sunken eyes.  Sleepiness.  Weakness. This information is not intended to replace advice given to you by your health care provider. Make sure you discuss any questions you have with your health care provider. Document Released: 08/29/2007 Document Revised: 09/30/2015 Document Reviewed: 08/24/2015 Elsevier Interactive Patient Education  2017 [place], [facility]

## 2016-03-16 NOTE — Patient Instructions (Signed)

## 2016-03-20 ENCOUNTER — Ambulatory Visit
Admission: RE | Admit: 2016-03-20 | Discharge: 2016-03-20 | Disposition: A | Discharge status: Home / Self Care | Payer: Medicare Other | Source: Ambulatory Visit | Attending: Physician Assistant | Admitting: Physician Assistant

## 2016-03-20 DIAGNOSIS — N50819 Testicular pain, unspecified: Secondary | ICD-10-CM

## 2016-03-20 DIAGNOSIS — N50811 Right testicular pain: Secondary | ICD-10-CM | POA: Insufficient documentation

## 2016-03-21 ENCOUNTER — Telehealth: Payer: Self-pay

## 2016-03-21 NOTE — Telephone Encounter (Signed)
Patient advised as below. Patient verbalizes understanding and is in agreement with treatment plan.  

## 2016-03-21 NOTE — Telephone Encounter (Signed)
-----   Message from Trinna Post, [place] sent at 03/21/2016 11:09 AM EST ----- Scrotal ultrasound was normal. Please have patient follow up with [name].

## 2016-03-22 ENCOUNTER — Other Ambulatory Visit: Payer: Self-pay

## 2016-03-22 DIAGNOSIS — E1100 Type 2 diabetes mellitus with hyperosmolarity without nonketotic hyperglycemic-hyperosmolar coma (NKHHC): Secondary | ICD-10-CM

## 2016-03-22 DIAGNOSIS — E782 Mixed hyperlipidemia: Secondary | ICD-10-CM

## 2016-03-22 DIAGNOSIS — I10 Essential (primary) hypertension: Secondary | ICD-10-CM

## 2016-03-23 ENCOUNTER — Telehealth: Payer: Self-pay

## 2016-03-23 LAB — CBC WITH DIFFERENTIAL/PLATELET
BASOS: 0 %
Basophils Absolute: 0 10*3/uL (ref 0.0–0.2)
EOS (ABSOLUTE): 0.1 10*3/uL (ref 0.0–0.4)
EOS: 1 %
HEMATOCRIT: 45.4 % (ref 37.5–51.0)
Hemoglobin: 15.4 g/dL (ref 13.0–17.7)
Immature Grans (Abs): 0 10*3/uL (ref 0.0–0.1)
Immature Granulocytes: 0 %
Lymphocytes Absolute: 2.7 10*3/uL (ref 0.7–3.1)
Lymphs: 26 %
MCH: 29.8 pg (ref 26.6–33.0)
MCHC: 33.9 g/dL (ref 31.5–35.7)
MCV: 88 fL (ref 79–97)
MONOS ABS: 0.6 10*3/uL (ref 0.1–0.9)
Monocytes: 6 %
Neutrophils Absolute: 7 10*3/uL (ref 1.4–7.0)
Neutrophils: 67 %
Platelets: 320 10*3/uL (ref 150–379)
RBC: 5.16 x10E6/uL (ref 4.14–5.80)
RDW: 13.1 % (ref 12.3–15.4)
WBC: 10.4 10*3/uL (ref 3.4–10.8)

## 2016-03-23 LAB — COMPREHENSIVE METABOLIC PANEL
A/G RATIO: 1.8 (ref 1.2–2.2)
ALK PHOS: 92 IU/L (ref 39–117)
ALT: 24 IU/L (ref 0–44)
AST: 14 IU/L (ref 0–40)
Albumin: 4.4 g/dL (ref 3.5–5.5)
BUN/Creatinine Ratio: 20 (ref 9–20)
BUN: 18 mg/dL (ref 6–24)
Bilirubin Total: 0.6 mg/dL (ref 0.0–1.2)
CO2: 20 mmol/L (ref 18–29)
Calcium: 9.3 mg/dL (ref 8.7–10.2)
Chloride: 102 mmol/L (ref 96–106)
Creatinine, Ser: 0.88 mg/dL (ref 0.76–1.27)
GFR calc Af Amer: 109 mL/min/{1.73_m2} (ref >59)
GFR, EST NON AFRICAN AMERICAN: 95 mL/min/{1.73_m2} (ref >59)
GLOBULIN, TOTAL: 2.5 g/dL (ref 1.5–4.5)
Glucose: 148 mg/dL — ABNORMAL HIGH (ref 65–99)
POTASSIUM: 4.7 mmol/L (ref 3.5–5.2)
SODIUM: 138 mmol/L (ref 134–144)
Total Protein: 6.9 g/dL (ref 6.0–8.5)

## 2016-03-23 LAB — TSH: TSH: 0.97 u[IU]/mL (ref 0.450–4.500)

## 2016-03-23 LAB — LIPID PANEL
CHOL/HDL RATIO: 5.6 ratio — AB (ref 0.0–5.0)
Cholesterol, Total: 190 mg/dL (ref 100–199)
HDL: 34 mg/dL — ABNORMAL LOW (ref >39)
LDL Calculated: 133 mg/dL — ABNORMAL HIGH (ref 0–99)
TRIGLYCERIDES: 115 mg/dL (ref 0–149)
VLDL Cholesterol Cal: 23 mg/dL (ref 5–40)

## 2016-03-23 NOTE — Telephone Encounter (Signed)
Let them know his labs look good except for mildly elevated sugar and mildly elevated LDL cholesterol. May wish to discuss increasing dose of pravastatin with [name].

## 2016-03-23 NOTE — Telephone Encounter (Signed)
Patient's wife, [name], would like to have someone call them back about patient's results today please. They are back but under [employer], [employer]-aa

## 2016-03-23 NOTE — Telephone Encounter (Signed)
Patient advised as below. Patient verbalizes understanding and is in agreement with treatment plan.  

## 2016-03-29 ENCOUNTER — Telehealth: Payer: Self-pay | Admitting: Family Medicine

## 2016-03-29 NOTE — Telephone Encounter (Signed)
Pt has an appt tomorrow with  [name] but he is having a lot of nasal congestion.  He wants to know with his diabetes and medications what he can take to get some relief.  His call back is [phone]-  Thanks Con Memos

## 2016-03-29 NOTE — Telephone Encounter (Signed)
Advised patient per Fabio Bering he could try a humidifier and saline spray to help relieve the nasal congestion. Patient has a appointment scheduled for tomorrow.

## 2016-03-30 ENCOUNTER — Ambulatory Visit (INDEPENDENT_AMBULATORY_CARE_PROVIDER_SITE_OTHER): Payer: Medicare Other | Admitting: Family Medicine

## 2016-03-30 ENCOUNTER — Encounter: Payer: Self-pay | Admitting: Family Medicine

## 2016-03-30 VITALS — BP 142/88 | HR 100 | Temp 98.0°F | Resp 18 | Wt 212.6 lb

## 2016-03-30 DIAGNOSIS — N50811 Right testicular pain: Secondary | ICD-10-CM | POA: Diagnosis not present

## 2016-03-30 DIAGNOSIS — E1100 Type 2 diabetes mellitus with hyperosmolarity without nonketotic hyperglycemic-hyperosmolar coma (NKHHC): Secondary | ICD-10-CM | POA: Diagnosis not present

## 2016-03-30 DIAGNOSIS — G90522 Complex regional pain syndrome I of left lower limb: Secondary | ICD-10-CM

## 2016-03-30 DIAGNOSIS — I10 Essential (primary) hypertension: Secondary | ICD-10-CM | POA: Diagnosis not present

## 2016-03-30 NOTE — Progress Notes (Signed)
Patient canceled. Erroneous encounter.

## 2016-03-30 NOTE — Progress Notes (Signed)
Patient: Patrick Grant Male    DOB: June 04, 1955   61 y.o.   MRN: [id number] Visit Date: 03/30/2016  Today's Provider: Vernie Murders, PA   Chief Complaint  Patient presents with  . Follow-up   Subjective:    HPI Patient is here for a 2 week follow up. He seen Adriana on 03/16/2016 for testicular pain. Ultrasound ordered was normal. Patient also had labs done on 03/22/2016 results showed increased sugar and LDL. Patient was advised to follow up for right sided pain. Patient states pain has slight improved. Patient complains of dizziness,and mouth dryness while walking.   Patient Active Problem List   Diagnosis Date Noted  . Cervical pain 02/08/2015  . Cervical nerve root disorder 02/08/2015  . Complex regional pain syndrome 02/08/2015  . Bleeding from the nose 02/08/2015  . Reflex sympathetic dystrophy of lower extremity 02/08/2015  . Type 2 diabetes mellitus ([place]) 02/08/2015  . Combined fat and carbohydrate induced hyperlipemia 04/06/2008  . Elevated fasting blood sugar 10/10/2007  . Essential (primary) hypertension 09/30/2007   Past Surgical History:  Procedure Laterality Date  . SPINAL CORD STIMULATOR REMOVAL  2010   DUE TO LOSS OF MUSCLE USE WHEN ACTIVATED   Family History  Problem Relation Age of Onset  . CVA Mother   . Kidney failure Mother   . CVA Father   . Kidney failure Father   . Colon cancer Father   . Ovarian cancer Sister   . Diabetes Brother   . Healthy Sister    Allergies  Allergen Reactions  . Fentanyl     Other reaction(s): Other (See Comments) Other Reaction: Other reaction     Previous Medications   GLUCOSE BLOOD (ONE TOUCH ULTRA TEST) TEST STRIP    USE ONE STRIP TO CHECK FASTING BLOOD SUGAR ONCE DAILY   LISINOPRIL (PRINIVIL,ZESTRIL) 10 MG TABLET    TAKE ONE TABLET BY MOUTH ONCE DAILY   METFORMIN (GLUCOPHAGE) 500 MG TABLET    Take 1 tablet (500 mg total) by mouth 2 (two) times daily with a meal.   PRAVASTATIN (PRAVACHOL) 20 MG TABLET    TAKE  ONE TABLET BY MOUTH ONCE DAILY    Review of Systems  Constitutional: Negative.   Respiratory: Negative.   Cardiovascular: Negative.   Gastrointestinal: Positive for abdominal pain.  Neurological: Positive for dizziness.    Social History  Substance Use Topics  . Smoking status: Former Smoker    Years: 25.00    Types: Cigars    Quit date: 03/12/2016  . Smokeless tobacco: Never Used  . Alcohol use No   Objective:   BP (!) 142/88 (BP Location: Right Arm, Patient Position: Sitting, Cuff Size: Normal)   Pulse 100   Temp 98 F (36.7 C) (Oral)   Resp 18   Wt 212 lb 9.6 oz (96.4 kg)   SpO2 99%   BMI 28.83 kg/m   Physical Exam  Constitutional: He is oriented to person, place, and time. He appears well-developed and well-nourished.  HENT:  Head: Normocephalic.  Right Ear: External ear normal.  Left Ear: External ear normal.  Eyes: Conjunctivae are normal.  Neck: Neck supple.  Cardiovascular: Normal rate and regular rhythm.   Pulmonary/Chest: Effort normal and breath sounds normal.  Abdominal: Soft. Bowel sounds are normal. He exhibits no mass. There is no guarding.  Neurological: He is alert and oriented to person, place, and time.      Assessment & Plan:     1. Right testicular pain Improvement  in testicular and right lower abdomen discomfort. No diarrhea, gas, constipation, nausea, dyspepsia or melena. Ultrasound and circulation tests of scrotum were normal on 03-22-16. No sign of hydrocele, torsion, infection, etc.   2. Type 2 diabetes mellitus with hyperosmolarity without coma, without long-term current use of insulin ([facility]) Some slight light headed sensations occasionally. Eating 2 meals a day. Recheck BS was 148 on 03-22-16. May switch to  Metformin 500 mg once in the evenings and continue checking FBS each morning. Recheck labs in 3 months.  3. Complex regional pain syndrome type 1 of left lower extremity Stable. Continues to have severe pain with the least amount of  stimuli to the left foot. Walking with crutches helps.  4. Essential (primary) hypertension Still taking Lisinopril 10 mg q evening. BP slightly elevated today. Stopped smoking 19 days ago. Recommend he limit sodium intake and drink extra fluids. Recheck in 3 months.

## 2016-04-01 ENCOUNTER — Other Ambulatory Visit: Payer: Self-pay | Admitting: Family Medicine

## 2016-04-02 ENCOUNTER — Telehealth: Payer: Self-pay | Admitting: Family Medicine

## 2016-04-02 NOTE — Telephone Encounter (Signed)
[facility] pharmacy needed a diagnosis code on a new RX for test strips. Paper on Patrick Grant's desk.

## 2016-04-02 NOTE — Telephone Encounter (Signed)
Pt called saying [facility] told him that they recd the order but they needed more information from [place] regarding the test strips.  Pt asked that you contact [place].  Please advise pt [phone]  [facility]

## 2016-04-02 NOTE — Telephone Encounter (Signed)
Sent refill to [facility] at [employer] today.

## 2016-04-02 NOTE — Telephone Encounter (Signed)
Patient advised.

## 2016-04-02 NOTE — Telephone Encounter (Signed)
Pt called needing his test strips for his One Touch meter.  ThanksTeri

## 2016-04-03 MED ORDER — GLUCOSE BLOOD VI STRP: ORAL_STRIP | 4 refills | Status: DC

## 2016-04-06 ENCOUNTER — Telehealth: Payer: Self-pay | Admitting: Family Medicine

## 2016-04-06 DIAGNOSIS — N50811 Right testicular pain: Secondary | ICD-10-CM

## 2016-04-06 NOTE — Telephone Encounter (Signed)
Patient agrees to proceed with urology referral. Referral order. Patient scheduled for a OV to evaluate dizziness.

## 2016-04-06 NOTE — Telephone Encounter (Signed)
Patient advised. 6 week follow up scheduled.   Patient states he is still having dizzy spells, right testicle pain, and right side abdominal pain. Pain is unchanged since OV. Patient wanted to know what Dennis's recommendations are. Please advise.

## 2016-04-06 NOTE — Telephone Encounter (Signed)
Pt stated that he was advised at his last OV to monitor his blood glucose and call back to let [place]. Pt stated that he test his sugar before dinner and the lowest it has been was 145 but the average is between 150-156. Thanks TNP

## 2016-04-06 NOTE — Telephone Encounter (Signed)
Last Hgb A1C was 7.1% in November 2017. Continue present medications and checking blood sugar each morning. Schedule recheck appointment in 4-6 weeks to recheck Hgb A1C and see if adjustments to diabetes treatment is needed.

## 2016-04-06 NOTE — Telephone Encounter (Signed)
LMTCB

## 2016-04-06 NOTE — Telephone Encounter (Signed)
If he is continuing to have the pain into the right testicle, recommend evaluation by an urologist.

## 2016-04-08 ENCOUNTER — Emergency Department
Admission: EM | Admit: 2016-04-08 | Discharge: 2016-04-08 | Disposition: A | Discharge status: Home / Self Care | Payer: Medicare Other | Attending: Emergency Medicine | Admitting: Emergency Medicine

## 2016-04-08 ENCOUNTER — Encounter: Payer: Self-pay | Admitting: Radiology

## 2016-04-08 ENCOUNTER — Emergency Department: Payer: Medicare Other

## 2016-04-08 DIAGNOSIS — N50811 Right testicular pain: Secondary | ICD-10-CM | POA: Diagnosis not present

## 2016-04-08 DIAGNOSIS — I10 Essential (primary) hypertension: Secondary | ICD-10-CM | POA: Insufficient documentation

## 2016-04-08 DIAGNOSIS — R1031 Right lower quadrant pain: Secondary | ICD-10-CM | POA: Insufficient documentation

## 2016-04-08 DIAGNOSIS — R0602 Shortness of breath: Secondary | ICD-10-CM | POA: Diagnosis not present

## 2016-04-08 DIAGNOSIS — K5730 Diverticulosis of large intestine without perforation or abscess without bleeding: Secondary | ICD-10-CM | POA: Diagnosis not present

## 2016-04-08 DIAGNOSIS — Z79899 Other long term (current) drug therapy: Secondary | ICD-10-CM | POA: Insufficient documentation

## 2016-04-08 DIAGNOSIS — Z87891 Personal history of nicotine dependence: Secondary | ICD-10-CM | POA: Diagnosis not present

## 2016-04-08 DIAGNOSIS — R109 Unspecified abdominal pain: Secondary | ICD-10-CM | POA: Diagnosis not present

## 2016-04-08 DIAGNOSIS — E119 Type 2 diabetes mellitus without complications: Secondary | ICD-10-CM | POA: Diagnosis not present

## 2016-04-08 DIAGNOSIS — N50819 Testicular pain, unspecified: Secondary | ICD-10-CM

## 2016-04-08 DIAGNOSIS — Z7984 Long term (current) use of oral hypoglycemic drugs: Secondary | ICD-10-CM | POA: Diagnosis not present

## 2016-04-08 HISTORY — DX: Essential (primary) hypertension: I10

## 2016-04-08 HISTORY — DX: Type 2 diabetes mellitus without complications: E11.9

## 2016-04-08 LAB — COMPREHENSIVE METABOLIC PANEL
ALK PHOS: 76 U/L (ref 38–126)
ALT: 21 U/L (ref 17–63)
AST: 20 U/L (ref 15–41)
Albumin: 4.5 g/dL (ref 3.5–5.0)
Anion gap: 10 (ref 5–15)
BILIRUBIN TOTAL: 0.7 mg/dL (ref 0.3–1.2)
BUN: 16 mg/dL (ref 6–20)
CALCIUM: 9.9 mg/dL (ref 8.9–10.3)
CO2: 20 mmol/L — ABNORMAL LOW (ref 22–32)
CREATININE: 0.96 mg/dL (ref 0.61–1.24)
Chloride: 107 mmol/L (ref 101–111)
GFR calc Af Amer: >60 mL/min (ref >60)
Glucose, Bld: 138 mg/dL — ABNORMAL HIGH (ref 65–99)
Potassium: 3.5 mmol/L (ref 3.5–5.1)
Sodium: 137 mmol/L (ref 135–145)
TOTAL PROTEIN: 7.7 g/dL (ref 6.5–8.1)

## 2016-04-08 LAB — LIPASE, BLOOD: LIPASE: 28 U/L (ref 11–51)

## 2016-04-08 LAB — URINALYSIS, COMPLETE (UACMP) WITH MICROSCOPIC
Bacteria, UA: NONE SEEN
Bilirubin Urine: NEGATIVE
GLUCOSE, UA: NEGATIVE mg/dL
Hgb urine dipstick: NEGATIVE
Ketones, ur: 5 mg/dL — AB
Leukocytes, UA: NEGATIVE
Nitrite: NEGATIVE
PROTEIN: NEGATIVE mg/dL
SPECIFIC GRAVITY, URINE: 1.014 (ref 1.005–1.030)
pH: 5 (ref 5.0–8.0)

## 2016-04-08 LAB — CBC
HCT: 42.8 % (ref 40.0–52.0)
HEMOGLOBIN: 14.9 g/dL (ref 13.0–18.0)
MCH: 30.6 pg (ref 26.0–34.0)
MCHC: 34.9 g/dL (ref 32.0–36.0)
MCV: 87.7 fL (ref 80.0–100.0)
Platelets: 319 10*3/uL (ref 150–440)
RBC: 4.87 MIL/uL (ref 4.40–5.90)
RDW: 13.3 % (ref 11.5–14.5)
WBC: 11 10*3/uL — ABNORMAL HIGH (ref 3.8–10.6)

## 2016-04-08 LAB — TROPONIN I: Troponin I: <0.03 ng/mL (ref <0.03)

## 2016-04-08 MED ORDER — IOPAMIDOL (ISOVUE-300) INJECTION 61%
30.0000 mL | Freq: Once | INTRAVENOUS | Status: AC
  Administered 2016-04-08: 30 mL via ORAL
  Filled 2016-04-08: qty 30

## 2016-04-08 MED ORDER — IOPAMIDOL (ISOVUE-300) INJECTION 61%
100.0000 mL | Freq: Once | INTRAVENOUS | Status: AC | PRN
  Administered 2016-04-08: 100 mL via INTRAVENOUS
  Filled 2016-04-08: qty 100

## 2016-04-08 MED ORDER — SODIUM CHLORIDE 0.9 % IV BOLUS (SEPSIS)
1000.0000 mL | Freq: Once | INTRAVENOUS | Status: AC
  Administered 2016-04-08: 1000 mL via INTRAVENOUS

## 2016-04-08 NOTE — ED Provider Notes (Signed)
[facility] Emergency Department Provider Note  ____________________________________________   First MD Initiated Contact with Patient 04/08/16 1909     (approximate)  I have reviewed the triage vital signs and the nursing notes.   HISTORY  Chief Complaint Shortness of Breath   HPI Patrick Grant is a 61 y.o. male with a history of diabetes as well as reflex sympathetic dystrophy who is presenting to the emergency department today with several weeks of right testicular pain radiating into his right abdomen. He says that over the past for which she is also experienced shortness of breath but without any chest pain. He says that he occasionally feels that his throat is closing but does not have the sensation at this time. Through his primary care doctor he has had a testicular ultrasound which was negative. He says that he has also had urine studies. He denies any nausea vomiting or diarrhea. No known history of kidney stones. No fever.   No past medical history on file.  Patient Active Problem List   Diagnosis Date Noted  . Cervical pain 02/08/2015  . Cervical nerve root disorder 02/08/2015  . Complex regional pain syndrome 02/08/2015  . Bleeding from the nose 02/08/2015  . Reflex sympathetic dystrophy of lower extremity 02/08/2015  . Type 2 diabetes mellitus ([place]) 02/08/2015  . Combined fat and carbohydrate induced hyperlipemia 04/06/2008  . Elevated fasting blood sugar 10/10/2007  . Essential (primary) hypertension 09/30/2007    Past Surgical History:  Procedure Laterality Date  . SPINAL CORD STIMULATOR REMOVAL  2010   DUE TO LOSS OF MUSCLE USE WHEN ACTIVATED    Prior to Admission medications   Medication Sig Start Date End Date Taking? Authorizing Provider  glucose blood (ONE TOUCH ULTRA TEST) test strip USE ONE STRIP TO CHECK FASTING BLOOD SUGAR ONCE DAILY 04/03/16   Vickki Muff Chrismon, PA  lisinopril (PRINIVIL,ZESTRIL) 10 MG tablet TAKE ONE  TABLET BY MOUTH ONCE DAILY 09/08/15   Vickki Muff Chrismon, PA  metFORMIN (GLUCOPHAGE) 500 MG tablet Take 1 tablet (500 mg total) by mouth 2 (two) times daily with a meal. 02/09/16   Vickki Muff Chrismon, PA  pravastatin (PRAVACHOL) 20 MG tablet TAKE ONE TABLET BY MOUTH ONCE DAILY 09/08/15   Vickki Muff Chrismon, PA    Allergies Fentanyl  Family History  Problem Relation Age of Onset  . CVA Mother   . Kidney failure Mother   . CVA Father   . Kidney failure Father   . Colon cancer Father   . Ovarian cancer Sister   . Diabetes Brother   . Healthy Sister     Social History Social History  Substance Use Topics  . Smoking status: Former Smoker    Years: 25.00    Types: Cigars    Quit date: 03/12/2016  . Smokeless tobacco: Never Used  . Alcohol use No    Review of Systems Constitutional: No fever/chills Eyes: No visual changes. ENT: No sore throat. Cardiovascular: Denies chest pain. Respiratory: as above.   Gastrointestinal: No nausea, no vomiting.  No diarrhea.  No constipation. Genitourinary: Negative for dysuria. Musculoskeletal: Negative for back pain. Skin: Negative for rash. Neurological: Negative for headaches, focal weakness or numbness.  10-point ROS otherwise negative.  ____________________________________________   PHYSICAL EXAM:  VITAL SIGNS: ED Triage Vitals  Enc Vitals Group     BP 04/08/16 1723 (!) 176/84     Pulse Rate 04/08/16 1723 70     Resp 04/08/16 1723 20  Temp 04/08/16 1723 98.2 F (36.8 C)     Temp Source 04/08/16 1723 Oral     SpO2 04/08/16 1723 100 %     Weight 04/08/16 1724 216 lb (98 kg)     Height 04/08/16 1724 6' (1.829 m)     Head Circumference --      Peak Flow --      Pain Score 04/08/16 1727 6     Pain Loc --      Pain Edu? --      Excl. in [place]? --     Constitutional: Alert and oriented. Well appearing and in no acute distress. Eyes: Conjunctivae are normal. PERRL. EOMI. Head: Atraumatic. Nose: No  congestion/rhinnorhea. Mouth/Throat: Mucous membranes are moist.  Oropharynx non-erythematous. Neck: No stridor.   Cardiovascular: Normal rate, regular rhythm. Grossly normal heart sounds.  Respiratory: Normal respiratory effort.  No retractions. Lungs CTAB. Gastrointestinal: Soft and nontender. No distention.  Genitourinary:  Normal external exam in this circumcised male. Mild right epididymal tenderness palpation. No testicular swelling. Normal lie. Musculoskeletal: No lower extremity tenderness nor edema.  No joint effusions. Neurologic:  Normal speech and language. No gross focal neurologic deficits are appreciated. Skin:  Skin is warm, dry and intact. No rash noted. Psychiatric: Mood and affect are normal. Speech and behavior are normal.  ____________________________________________   LABS (all labs ordered are listed, but only abnormal results are displayed)  Labs Reviewed  CBC - Abnormal; Notable for the following:       Result Value   WBC 11.0 (*)    All other components within normal limits  COMPREHENSIVE METABOLIC PANEL - Abnormal; Notable for the following:    CO2 20 (*)    Glucose, Bld 138 (*)    All other components within normal limits  TROPONIN I  LIPASE, BLOOD  URINALYSIS, COMPLETE (UACMP) WITH MICROSCOPIC   ____________________________________________  EKG  ED ECG REPORT I, Doran Stabler, the attending physician, personally viewed and interpreted this ECG.   Date: 04/08/2016  EKG Time: 1721  Rate: 83  Rhythm: normal sinus rhythm  Axis: normal   Intervals: Right bundle-branch block  ST&T Change: T wave inversions in V2 and V3. No previous EKGs for comparison. No ST elevation or depression.  ____________________________________________  [id number]    DG Chest 2 View (Final result)  Result time 04/08/16 18:04:41  Final result by Madie Reno, MD (04/08/16 18:04:41)           Narrative:   CLINICAL DATA: Shortness of breath,  dizziness  EXAM: CHEST 2 VIEW  COMPARISON: None.  FINDINGS: Minimal atelectasis at the lingula. No focal consolidation or effusion. Normal cardiomediastinal silhouette. No pneumothorax. Degenerative changes of the spine.  IMPRESSION: No radiographic evidence for acute cardiopulmonary abnormality.   Electronically Signed By: Donavan Foil M.D. On: 04/08/2016 18:04         CT Abdomen Pelvis W Contrast (Final result)  Result time 04/08/16 21:10:28  Final result by Lajean Manes, MD (04/08/16 21:10:28)           Narrative:   CLINICAL DATA: Right testicle pain radiating to RLQ x 2 weeks. Constipation. Pt denies surgical hx.  EXAM: CT ABDOMEN AND PELVIS WITH CONTRAST  TECHNIQUE: Multidetector CT imaging of the abdomen and pelvis was performed using the standard protocol following bolus administration of intravenous contrast.  CONTRAST: 136mL ISOVUE-300 IOPAMIDOL (ISOVUE-300) INJECTION 61%  COMPARISON: None.  FINDINGS: Lower chest: No acute abnormality.  Hepatobiliary: No focal liver abnormality is seen. No  gallstones, gallbladder wall thickening, or biliary dilatation.  Pancreas: Unremarkable. No pancreatic ductal dilatation or surrounding inflammatory changes.  Spleen: Normal in size without focal abnormality.  Adrenals/Urinary Tract: Left adrenal gland thickening, likely mild hyperplasia. Normal right adrenal gland. 2 cm water density mass arises from the upper pole left kidney consistent with a cyst. 3 mm low-density mass arises from the midpole the left kidney, nonspecific but also likely a cyst. Two nonobstructing small stones are noted in the right kidney. No left intrarenal stones. No hydronephrosis. Normal ureters. Normal bladder.  Stomach/Bowel: Stomach and small bowel are unremarkable. There are scattered colonic diverticula. No diverticulitis. Colon otherwise unremarkable. Normal appendix visualized.  Vascular/Lymphatic: Aortic  atherosclerosis. Infrarenal abdominal aorta dilated to 2.5 cm. Atherosclerotic changes extending to the iliac and common femoral vessels. No adenopathy.  Reproductive: Unremarkable.  Other: No abdominal wall hernia or abnormality. No abdominopelvic ascites.  Musculoskeletal: No fracture or acute finding. No osteoblastic or osteolytic lesions.  IMPRESSION: 1. No acute findings. No findings to account for the patient's right lower quadrant to right testicle region pain. 2. Small nonobstructing stones in the right kidney. No ureteral stone or obstructive uropathy. 3. There scattered colonic diverticula. No diverticulitis. Normal appendix visualized. 4. Aortic atherosclerosis. Aortic ectasia with the infrarenal aorta measuring 2.5 cm. Ectatic abdominal aorta at risk for aneurysm development. Recommend followup by ultrasound in 5 years. This recommendation follows ACR consensus guidelines: White Paper of the ACR Incidental Findings Committee II on Vascular Findings. J Am Coll Radiol 2013; 10:789-794.   Electronically Signed By: Lajean Manes M.D. On: 04/08/2016 21:10            ____________________________________________   PROCEDURES  Procedure(s) performed:   Procedures  Critical Care performed:   ____________________________________________   INITIAL IMPRESSION / ASSESSMENT AND PLAN / ED COURSE  Pertinent labs & imaging results that were available during my care of the patient were reviewed by me and considered in my medical decision making (see chart for details).   Clinical Course   ----------------------------------------- 9:44 PM on 04/08/2016 -----------------------------------------  Patient resting comfortable at this time. Largely reassuring workup. He doesn't a right bundle branch block. However, he says that his shortness of breath happens when he is having pain that is radiating up from his testicle into his abdomen. I believe this is likely  an incidental finding. However, he does have an appointment with his primary care doctor tomorrow and will follow up regarding this. He says that his primary care doctor also trying to arrange urology follow-up for him.  No acute findings today on his workup. He is resting comfortably at this time. He'll be discharged home. We discussed the findings including the kidney stones in his right kidney. He is understanding of the plan and willing to comply. The cause of the testicular pain. He has had a negative workup as far as imaging and urinalysis have gone. Question sterile epididymitis. Feel that he should've further evaluation as this problem at this point has been going on for weeks.   ____________________________________________   FINAL CLINICAL IMPRESSION(S) / ED DIAGNOSES  Abdominal pain. Testicular pain. Shortness of breath.    NEW MEDICATIONS STARTED DURING THIS VISIT:  New Prescriptions   No medications on file     Note:  This document was prepared using Dragon voice recognition software and may include unintentional dictation errors.    Orbie Pyo, MD 04/08/16 2147

## 2016-04-08 NOTE — ED Notes (Signed)
Patient transported to CT 

## 2016-04-08 NOTE — ED Notes (Signed)
Patient informed to hold metformin for 48 hours per MD orders.

## 2016-04-08 NOTE — ED Triage Notes (Signed)
Pt states that his throat is drying up quickly and states that it is causing him to feel sob, pt states that he has been having ongoing pain in his rt testicle and rt flank pt is scheduled to see his dr about that tomorrow. Pt is concerned about his throat and feels like it is closing unless he keeps sipping on water

## 2016-04-09 ENCOUNTER — Ambulatory Visit (INDEPENDENT_AMBULATORY_CARE_PROVIDER_SITE_OTHER): Payer: Medicare Other | Admitting: Family Medicine

## 2016-04-09 ENCOUNTER — Encounter: Payer: Self-pay | Admitting: Family Medicine

## 2016-04-09 ENCOUNTER — Other Ambulatory Visit: Payer: Self-pay | Admitting: Family Medicine

## 2016-04-09 VITALS — BP 132/78 | HR 78 | Temp 97.8°F | Resp 16 | Wt 216.8 lb

## 2016-04-09 DIAGNOSIS — I4510 Unspecified right bundle-branch block: Secondary | ICD-10-CM | POA: Diagnosis not present

## 2016-04-09 DIAGNOSIS — I10 Essential (primary) hypertension: Secondary | ICD-10-CM | POA: Diagnosis not present

## 2016-04-09 DIAGNOSIS — N50811 Right testicular pain: Secondary | ICD-10-CM

## 2016-04-09 DIAGNOSIS — N200 Calculus of kidney: Secondary | ICD-10-CM

## 2016-04-09 DIAGNOSIS — G90522 Complex regional pain syndrome I of left lower limb: Secondary | ICD-10-CM | POA: Diagnosis not present

## 2016-04-09 NOTE — Progress Notes (Signed)
Patient: Patrick Grant Male    DOB: 10-25-1955   61 y.o.   MRN: [id number] Visit Date: 04/09/2016  Today's Provider: Vernie Murders, PA   Chief Complaint  Patient presents with  . Dizziness  . Hospitalization Follow-up   Subjective:    Dizziness  This is a new problem. The current episode started more than 1 month ago. The problem occurs 2 to 4 times per day. The problem has been unchanged. Associated symptoms include abdominal pain. Exacerbated by: activity. He has tried nothing for the symptoms.  Patient reports having the same sensation of dizziness when riding in the car looking out the window.   Past Medical History:  Diagnosis Date  . Diabetes mellitus without complication ([place])   . Hypertension    Past Surgical History:  Procedure Laterality Date  . SPINAL CORD STIMULATOR REMOVAL  2010   DUE TO LOSS OF MUSCLE USE WHEN ACTIVATED   Family History  Problem Relation Age of Onset  . CVA Mother   . Kidney failure Mother   . CVA Father   . Kidney failure Father   . Colon cancer Father   . Ovarian cancer Sister   . Diabetes Brother   . Healthy Sister    Allergies  Allergen Reactions  . Fentanyl     Other reaction(s): Other (See Comments) Other Reaction: Other reaction     Previous Medications   GLUCOSE BLOOD (ONE TOUCH ULTRA TEST) TEST STRIP    USE ONE STRIP TO CHECK FASTING BLOOD SUGAR ONCE DAILY   LISINOPRIL (PRINIVIL,ZESTRIL) 10 MG TABLET    TAKE ONE TABLET BY MOUTH ONCE DAILY   METFORMIN (GLUCOPHAGE) 500 MG TABLET    Take 1 tablet (500 mg total) by mouth 2 (two) times daily with a meal.   PRAVASTATIN (PRAVACHOL) 20 MG TABLET    TAKE ONE TABLET BY MOUTH ONCE DAILY    Review of Systems  Constitutional: Negative.   Respiratory: Negative.   Cardiovascular: Negative.   Gastrointestinal: Positive for abdominal pain.  Neurological: Positive for dizziness.    Social History  Substance Use Topics  . Smoking status: Former Smoker    Years: 25.00    Types:  Cigars    Quit date: 03/12/2016  . Smokeless tobacco: Never Used  . Alcohol use No   Objective:   BP 132/78 (BP Location: Right Arm, Patient Position: Sitting, Cuff Size: Normal)   Pulse 78   Temp 97.8 F (36.6 C) (Oral)   Resp 16   Wt 216 lb 12.8 oz (98.3 kg)   SpO2 98%   BMI 29.40 kg/m   Physical Exam  Constitutional: He is oriented to person, place, and time. He appears well-developed and well-nourished.  HENT:  Head: Normocephalic.  Eyes: Conjunctivae are normal.  Neck: Neck supple.  Cardiovascular: Normal rate, regular rhythm and normal heart sounds.   Pulmonary/Chest: Effort normal and breath sounds normal.  Abdominal: Soft. Bowel sounds are normal.  Minimal right abdomen discomfort radiating into the right testicle. No masses felt.  Neurological: He is alert and oriented to person, place, and time.      Assessment & Plan:      1. Pain in right testicle Onset approximately 3-4 weeks ago. Ultrasound of scrotum and pelvic dopplers were normal on 03-20-16. Urinalysis and lab tests have been normal as of 03-22-16 except some elevation of glucose and cholesterol. Went to the ER yesterday and CT scan of abdomen and pelvis was negative for diverticulitis, stomach or small bowel  disease. There was a couple small non-obstructing stones in the right kidney. Will schedule referral to urologist as suggested by ER.  - Ambulatory referral to Urology  2. Kidney stones Two non-obstructing stones in the right kidney and left kidney cyst. Schedule urology referral. - Ambulatory referral to Urology  3. New onset right bundle branch block (RBBB) In the ER yesterday, he was complaining of some sharp chest discomfort on the left for a very short period of time. EKG showed and new RBBB. No palpitations of dyspnea today. Has some sweats intermittently that was thought to be due to his complex region pain syndrome in the left foot. Will schedule cardiology referral. - Ambulatory referral to  Cardiology  4. Complex regional pain syndrome type 1 of left lower extremity Diagnosed after a fracture of the left foot in 2008. Continues to have severe pain with the lightest touch and walks with crutches. Can only withstand the use of flip-flops as shoes. Prefers to stay away from any narcotics.  5. Essential (primary) hypertension Presently taking Lisinopril 10 mg qd without coughing side effect. Good BP control.

## 2016-04-09 NOTE — Patient Instructions (Signed)
Check fasting blood sugar each day and call report of readings after restarting Metformin 500 mg once a day for a week. Drink extra fluids.

## 2016-04-14 ENCOUNTER — Telehealth: Payer: Self-pay

## 2016-04-14 NOTE — Telephone Encounter (Signed)
Pt called c/o SOB, presyncope, dizziness, "throat closing up". Pt denies having new medications that could cause an allergic reaction. Also reports having some left sided chest pain, right arm pain. Denies N/V. Sx started at 0300 today. Encouraged pt to go to ED. Pt reports he was recently in ED, and they found a right bundle branch block. Pt reports he is experiencing nasal congestion, and saline is no longer helping. Asked pt if the dyspnea is coming from nasal congestion, but he reports that he can not get a good breath when breathing through is mouth. Advised pt to go to ED again to R/O MI, and then advised pt to call back for OV with PCP regarding nasal congestion. Renaldo Fiddler, CMA

## 2016-04-17 ENCOUNTER — Ambulatory Visit (INDEPENDENT_AMBULATORY_CARE_PROVIDER_SITE_OTHER): Payer: Medicare Other | Admitting: Cardiology

## 2016-04-17 ENCOUNTER — Ambulatory Visit (INDEPENDENT_AMBULATORY_CARE_PROVIDER_SITE_OTHER): Payer: Medicare Other | Admitting: Family Medicine

## 2016-04-17 ENCOUNTER — Encounter: Payer: Self-pay | Admitting: Family Medicine

## 2016-04-17 ENCOUNTER — Encounter: Payer: Self-pay | Admitting: Cardiology

## 2016-04-17 VITALS — BP 126/84 | HR 84 | Ht 72.0 in | Wt 215.8 lb

## 2016-04-17 VITALS — BP 128/86 | HR 91 | Temp 98.3°F | Resp 16 | Wt 209.8 lb

## 2016-04-17 DIAGNOSIS — E1100 Type 2 diabetes mellitus with hyperosmolarity without nonketotic hyperglycemic-hyperosmolar coma (NKHHC): Secondary | ICD-10-CM | POA: Diagnosis not present

## 2016-04-17 DIAGNOSIS — I10 Essential (primary) hypertension: Secondary | ICD-10-CM | POA: Diagnosis not present

## 2016-04-17 DIAGNOSIS — R0602 Shortness of breath: Secondary | ICD-10-CM | POA: Diagnosis not present

## 2016-04-17 DIAGNOSIS — R9431 Abnormal electrocardiogram [ECG] [EKG]: Secondary | ICD-10-CM | POA: Diagnosis not present

## 2016-04-17 DIAGNOSIS — J3489 Other specified disorders of nose and nasal sinuses: Secondary | ICD-10-CM

## 2016-04-17 MED ORDER — FLUTICASONE PROPIONATE 50 MCG/ACT NA SUSP: 2.0000 | Freq: Every day | NASAL | 6 refills | Status: DC

## 2016-04-17 MED ORDER — NITROGLYCERIN 0.4 MG SL SUBL: 0.4000 mg | SUBLINGUAL_TABLET | SUBLINGUAL | 6 refills | Status: DC | PRN

## 2016-04-17 NOTE — Patient Instructions (Addendum)
Medication Instructions:  Your physician has recommended you make the following change in your medication:  1. Nitroglycerin 0.4 mg under the tongue as needed for chest pain. If it persists you can take additional dose every 5 minutes up to a total of 3 tablets. Please read attached information before taking first dose.    Testing/Procedures: Your physician has requested that you have an echocardiogram. Echocardiography is a painless test that uses sound waves to create images of your heart. It provides your doctor with information about the size and shape of your heart and how well your heart's chambers and valves are working. This procedure takes approximately one hour. There are no restrictions for this procedure.  [place]  Your caregiver has ordered a Stress Test with nuclear imaging. The purpose of this test is to evaluate the blood supply to your heart muscle. This procedure is referred to as a "Non-Invasive Stress Test." This is because other than having an IV started in your vein, nothing is inserted or "invades" your body. Cardiac stress tests are done to find areas of poor blood flow to the heart by determining the extent of coronary artery disease (CAD). Some patients exercise on a treadmill, which naturally increases the blood flow to your heart, while others who are  unable to walk on a treadmill due to physical limitations have a pharmacologic/chemical stress agent called Lexiscan . This medicine will mimic walking on a treadmill by temporarily increasing your coronary blood flow.   Please note: these test may take anywhere between 2-4 hours to complete  PLEASE REPORT TO [place] AT THE FIRST DESK WILL DIRECT YOU WHERE TO GO  Date of Procedure:_Wednesday April 27, 2016 at 09:00AM_  Arrival Time for Procedure:__Arrive at 08:45AM to register__  Instructions regarding medication:   __X__ : Hold diabetes medication Metformin (Glucophage) the  morning of procedure    PLEASE NOTIFY THE OFFICE AT LEAST 24 HOURS IN ADVANCE IF YOU ARE UNABLE TO KEEP YOUR APPOINTMENT.  [phone] AND  PLEASE NOTIFY NUCLEAR MEDICINE AT [facility] AT LEAST 24 HOURS IN ADVANCE IF YOU ARE UNABLE TO KEEP YOUR APPOINTMENT. [phone]  How to prepare for your Myoview test:  1. Do not eat or drink after midnight 2. No caffeine for 24 hours prior to test 3. No smoking 24 hours prior to test. 4. Your medication may be taken with water.  If your doctor stopped a medication because of this test, do not take that medication. 5. Ladies, please do not wear dresses.  Skirts or pants are appropriate. Please wear a short sleeve shirt. 6. No perfume, cologne or lotion. 7. Wear comfortable walking shoes. No heels!    Follow-Up: Your physician recommends that you schedule a follow-up appointment after testing with Dr. Yvone Neu.  It was a pleasure seeing you today here in the office. Please do not hesitate to give [place] a call back if you have any further questions. [place], BSN     Pharmacologic Stress Electrocardiogram A pharmacologic stress electrocardiogram is a heart (cardiac) test that uses nuclear imaging to evaluate the blood supply to your heart. This test may also be called a pharmacologic stress electrocardiography. Pharmacologic means that a medicine is used to increase your heart rate and blood pressure.  This stress test is done to find areas of poor blood flow to the heart by determining the extent of coronary artery disease (CAD). Some people exercise on a treadmill, which naturally increases  the blood flow to the heart. For those people unable to exercise on a treadmill, a medicine is used. This medicine stimulates your heart and will cause your heart to beat harder and more quickly, as if you were exercising.  Pharmacologic stress tests can help determine:  The adequacy of blood flow to your heart during increased levels of activity in  order to clear you for discharge home.  The extent of coronary artery blockage caused by CAD.  Your prognosis if you have suffered a heart attack.  The effectiveness of cardiac procedures done, such as an angioplasty, which can increase the circulation in your coronary arteries.  Causes of chest pain or pressure. LET [facility] CARE PROVIDER KNOW ABOUT:  Any allergies you have.  All medicines you are taking, including vitamins, herbs, eye drops, creams, and over-the-counter medicines.  Previous problems you or members of your family have had with the use of anesthetics.  Any blood disorders you have.  Previous surgeries you have had.  Medical conditions you have.  Possibility of pregnancy, if this applies.  If you are currently breastfeeding. RISKS AND COMPLICATIONS Generally, this is a safe procedure. However, as with any procedure, complications can occur. Possible complications include:  You develop pain or pressure in the following areas:  Chest.  Jaw or neck.  Between your shoulder blades.  Radiating down your left arm.  Headache.  Dizziness or light-headedness.  Shortness of breath.  Increased or irregular heartbeat.  Low blood pressure.  Nausea or vomiting.  Flushing.  Redness going up the arm and slight pain during injection of medicine.  Heart attack (rare). BEFORE THE PROCEDURE   Avoid all forms of caffeine for 24 hours before your test or as directed by your health care provider. This includes coffee, tea (even decaffeinated tea), caffeinated sodas, chocolate, cocoa, and certain pain medicines.  Follow your health care provider's instructions regarding eating and drinking before the test.  Take your medicines as directed at regular times with water unless instructed otherwise. Exceptions may include:  If you have diabetes, ask how you are to take your insulin or pills. It is common to adjust insulin dosing the morning of the test.  If you  are taking beta-blocker medicines, it is important to talk to your health care provider about these medicines well before the date of your test. Taking beta-blocker medicines may interfere with the test. In some cases, these medicines need to be changed or stopped 24 hours or more before the test.  If you wear a nitroglycerin patch, it may need to be removed prior to the test. Ask your health care provider if the patch should be removed before the test.  If you use an inhaler for any breathing condition, bring it with you to the test.  If you are an outpatient, bring a snack so you can eat right after the stress phase of the test.  Do not smoke for 4 hours prior to the test or as directed by your health care provider.  Do not apply lotions, powders, creams, or oils on your chest prior to the test.  Wear comfortable shoes and clothing. Let your health care provider know if you were unable to complete or follow the preparations for your test. PROCEDURE   Multiple patches (electrodes) will be put on your chest. If needed, small areas of your chest may be shaved to get better contact with the electrodes. Once the electrodes are attached to your body, multiple wires will be  attached to the electrodes, and your heart rate will be monitored.  An IV access will be started. A nuclear trace (isotope) is given. The isotope may be given intravenously, or it may be swallowed. Nuclear refers to several types of radioactive isotopes, and the nuclear isotope lights up the arteries so that the nuclear images are clear. The isotope is absorbed by your body. This results in low radiation exposure.  A resting nuclear image is taken to show how your heart functions at rest.  A medicine is given through the IV access.  A second scan is done about 1 hour after the medicine injection and determines how your heart functions under stress.  During this stress phase, you will be connected to an electrocardiogram  machine. Your blood pressure and oxygen levels will be monitored. AFTER THE PROCEDURE   Your heart rate and blood pressure will be monitored after the test.  You may return to your normal schedule, including diet,activities, and medicines, unless your health care provider tells you otherwise. This information is not intended to replace advice given to you by your health care provider. Make sure you discuss any questions you have with your health care provider. Document Released: 07/29/2008 Document Revised: 03/17/2013 Document Reviewed: 11/17/2012 Elsevier Interactive Patient Education  2017 [place]. Echocardiogram An echocardiogram, or echocardiography, uses sound waves (ultrasound) to produce an image of your heart. The echocardiogram is simple, painless, obtained within a short period of time, and offers valuable information to your health care provider. The images from an echocardiogram can provide information such as:  Evidence of coronary artery disease (CAD).  Heart size.  Heart muscle function.  Heart valve function.  Aneurysm detection.  Evidence of a past heart attack.  Fluid buildup around the heart.  Heart muscle thickening.  Assess heart valve function. Tell a health care provider about:  Any allergies you have.  All medicines you are taking, including vitamins, herbs, eye drops, creams, and over-the-counter medicines.  Any problems you or family members have had with anesthetic medicines.  Any blood disorders you have.  Any surgeries you have had.  Any medical conditions you have.  Whether you are pregnant or may be pregnant. What happens before the procedure? No special preparation is needed. Eat and drink normally. What happens during the procedure?  In order to produce an image of your heart, gel will be applied to your chest and a wand-like tool (transducer) will be moved over your chest. The gel will help transmit the sound waves from the  transducer. The sound waves will harmlessly bounce off your heart to allow the heart images to be captured in real-time motion. These images will then be recorded.  You may need an IV to receive a medicine that improves the quality of the pictures. What happens after the procedure? You may return to your normal schedule including diet, activities, and medicines, unless your health care provider tells you otherwise. This information is not intended to replace advice given to you by your health care provider. Make sure you discuss any questions you have with your health care provider. Document Released: 03/09/2000 Document Revised: 10/29/2015 Document Reviewed: 11/17/2012 Elsevier Interactive Patient Education  2017 [place]. Nitroglycerin sublingual tablets What is this medicine? NITROGLYCERIN (nye troe GLI ser in) is a type of vasodilator. It relaxes blood vessels, increasing the blood and oxygen supply to your heart. This medicine is used to relieve chest pain caused by angina. It is also used to prevent chest pain  before activities like climbing stairs, going outdoors in cold weather, or sexual activity. This medicine may be used for other purposes; ask your health care provider or pharmacist if you have questions. COMMON BRAND NAME(S): Nitroquick, Nitrostat, Nitrotab What should I tell my health care provider before I take this medicine? They need to know if you have any of these conditions: -anemia -head injury, recent stroke, or bleeding in the brain -liver disease -previous heart attack -an unusual or allergic reaction to nitroglycerin, other medicines, foods, dyes, or preservatives -pregnant or trying to get pregnant -breast-feeding How should I use this medicine? Take this medicine by mouth as needed. At the first sign of an angina attack (chest pain or tightness) place one tablet under your tongue. You can also take this medicine 5 to 10 minutes before an event likely to produce  chest pain. Follow the directions on the prescription label. Let the tablet dissolve under the tongue. Do not swallow whole. Replace the dose if you accidentally swallow it. It will help if your mouth is not dry. Saliva around the tablet will help it to dissolve more quickly. Do not eat or drink, smoke or chew tobacco while a tablet is dissolving. If you are not better within 5 minutes after taking ONE dose of nitroglycerin, call 9-1-1 immediately to seek emergency medical care. Do not take more than 3 nitroglycerin tablets over 15 minutes. If you take this medicine often to relieve symptoms of angina, your doctor or health care professional may provide you with different instructions to manage your symptoms. If symptoms do not go away after following these instructions, it is important to call 9-1-1 immediately. Do not take more than 3 nitroglycerin tablets over 15 minutes. Talk to your pediatrician regarding the use of this medicine in children. Special care may be needed. Overdosage: If you think you have taken too much of this medicine contact a poison control center or emergency room at once. NOTE: This medicine is only for you. Do not share this medicine with others. What if I miss a dose? This does not apply. This medicine is only used as needed. What may interact with this medicine? Do not take this medicine with any of the following medications: -certain migraine medicines like ergotamine and dihydroergotamine (DHE) -medicines used to treat erectile dysfunction like sildenafil, tadalafil, and vardenafil -riociguat This medicine may also interact with the following medications: -alteplase -aspirin -heparin -medicines for high blood pressure -medicines for mental depression -other medicines used to treat angina -phenothiazines like chlorpromazine, mesoridazine, prochlorperazine, thioridazine This list may not describe all possible interactions. Give your health care provider a list of all  the medicines, herbs, non-prescription drugs, or dietary supplements you use. Also tell them if you smoke, drink alcohol, or use illegal drugs. Some items may interact with your medicine. What should I watch for while using this medicine? Tell your doctor or health care professional if you feel your medicine is no longer working. Keep this medicine with you at all times. Sit or lie down when you take your medicine to prevent falling if you feel dizzy or faint after using it. Try to remain calm. This will help you to feel better faster. If you feel dizzy, take several deep breaths and lie down with your feet propped up, or bend forward with your head resting between your knees. You may get drowsy or dizzy. Do not drive, use machinery, or do anything that needs mental alertness until you know how this drug affects you.  Do not stand or sit up quickly, especially if you are an older patient. This reduces the risk of dizzy or fainting spells. Alcohol can make you more drowsy and dizzy. Avoid alcoholic drinks. Do not treat yourself for coughs, colds, or pain while you are taking this medicine without asking your doctor or health care professional for advice. Some ingredients may increase your blood pressure. What side effects may I notice from receiving this medicine? Side effects that you should report to your doctor or health care professional as soon as possible: -blurred vision -dry mouth -skin rash -sweating -the feeling of extreme pressure in the head -unusually weak or tired Side effects that usually do not require medical attention (report to your doctor or health care professional if they continue or are bothersome): -flushing of the face or neck -headache -irregular heartbeat, palpitations -nausea, vomiting This list may not describe all possible side effects. Call your doctor for medical advice about side effects. You may report side effects to FDA at 1-800-FDA-1088. Where should I keep my  medicine? Keep out of the reach of children. Store at room temperature between 20 and 25 degrees C (68 and 77 degrees F). Store in [employer]. Protect from light and moisture. Keep tightly closed. Throw away any unused medicine after the expiration date. NOTE: This sheet is a summary. It may not cover all possible information. If you have questions about this medicine, talk to your doctor, pharmacist, or health care provider.  2017 Elsevier/Gold Standard (2013-01-08 17:57:36)

## 2016-04-17 NOTE — Progress Notes (Signed)
Cardiology Office Note   Date:  04/17/2016   ID:  Macheal, Skains [birth year], MRN [id number]  Referring Doctor:  Vernie Murders, PA   Cardiologist:   Wende Bushy, MD   Reason for consultation:  Chief Complaint  Patient presents with  . New Patient (Initial Visit)    NO CP SINCE ER VISIT. SOME SOB WITH EXERTION. NOSE IS STUFFY. NO SWELLING.      History of Present Illness: Patrick Grant is a 61 y.o. male who presents for Abnormal EKG, right bundle branch block, shortness of breath.  Patient quit smoking 5 weeks ago. Since then, he has noted that he has developed a stuffy nose which was affecting his breathing. He noticed that the shortness of breath was getting worse, he wasn't sure whether it was a panic attack because of the stuffy nose. He reports the symptoms as moderate to severe in intensity, related to exertion, symptoms mainly in the chest no associated chest pain. On and off lasting minutes at a time goes away with rest. He mentions possible PND. No orthopnea or edema.  Patient denies chest pain, palpitations, abdominal pain, bleeding issues.    ROS:  Please see the history of present illness. Aside from mentioned under HPI, all other systems are reviewed and negative.     Past Medical History:  Diagnosis Date  . Diabetes mellitus without complication ([place])   . Hypertension     Past Surgical History:  Procedure Laterality Date  . SPINAL CORD STIMULATOR REMOVAL  2010   DUE TO LOSS OF MUSCLE USE WHEN ACTIVATED     reports that he quit smoking about 5 weeks ago. His smoking use included Cigars. He quit after 25.00 years of use. He has never used smokeless tobacco. He reports that he does not drink alcohol or use drugs.   family history includes CVA in his father and mother; Colon cancer in his father; Diabetes in his brother; Healthy in his sister; Kidney failure in his father and mother; Ovarian cancer in his sister.   Outpatient Medications Prior  to Visit  Medication Sig Dispense Refill  . fluticasone (FLONASE) 50 MCG/ACT nasal spray Place 2 sprays into both nostrils daily. 16 g 6  . glucose blood (ONE TOUCH ULTRA TEST) test strip USE ONE STRIP TO CHECK FASTING BLOOD SUGAR ONCE DAILY 100 each 4  . lisinopril (PRINIVIL,ZESTRIL) 10 MG tablet TAKE ONE TABLET BY MOUTH ONCE DAILY 90 tablet 3  . metFORMIN (GLUCOPHAGE) 500 MG tablet Take 1 tablet (500 mg total) by mouth 2 (two) times daily with a meal. 60 tablet 6  . pravastatin (PRAVACHOL) 20 MG tablet TAKE ONE TABLET BY MOUTH ONCE DAILY 90 tablet 3   No facility-administered medications prior to visit.      Allergies: Fentanyl    PHYSICAL EXAM: VS:  BP 126/84 (BP Location: Left Arm, Patient Position: Sitting, Cuff Size: Normal)   Pulse 84   Ht 6' (1.829 m)   Wt 215 lb 12.8 oz (97.9 kg)   BMI 29.27 kg/m  , Body mass index is 29.27 kg/m. Wt Readings from Last 3 Encounters:  04/17/16 215 lb 12.8 oz (97.9 kg)  04/17/16 209 lb 12.8 oz (95.2 kg)  04/09/16 216 lb 12.8 oz (98.3 kg)    GENERAL:  well developed, well nourished, not in acute distress HEENT: normocephalic, pink conjunctivae, anicteric sclerae, no xanthelasma, normal dentition, oropharynx clear NECK:  no neck vein engorgement, JVP normal, no hepatojugular reflux, carotid upstroke brisk  and symmetric, no bruit, no thyromegaly, no lymphadenopathy LUNGS:  good respiratory effort, clear to auscultation bilaterally CV:  PMI not displaced, no thrills, no lifts, S1 and S2 within normal limits, no palpable S3 or S4, no murmurs, no rubs, no gallops ABD:  Soft, nontender, nondistended, normoactive bowel sounds, no abdominal aortic bruit, no hepatomegaly, no splenomegaly MS: nontender back, no kyphosis, no scoliosis, no joint deformities EXT:  2+ DP/PT pulses, no edema, no varicosities, no cyanosis, no clubbing SKIN: warm, nondiaphoretic, normal turgor, no ulcers NEUROPSYCH: alert, oriented to person, place, and time, sensory/motor  grossly intact, normal mood, appropriate affect  Recent Labs: 03/22/2016: TSH 0.970 04/08/2016: ALT 21; BUN 16; Creatinine, Ser 0.96; Hemoglobin 14.9; Platelets 319; Potassium 3.5; Sodium 137   Lipid Panel    Component Value Date/Time   CHOL 190 03/22/2016 1003   TRIG 115 03/22/2016 1003   HDL 34 (L) 03/22/2016 1003   CHOLHDL 5.6 (H) 03/22/2016 1003   LDLCALC 133 (H) 03/22/2016 1003     Other studies Reviewed:  EKG:  The ekg from 04/17/2016 was personally reviewed by me and it revealed sinus rhythm, 84 BPM, right bundle-branch block. Abnormal EKG.  Additional studies/ records that were reviewed personally reviewed by me today include: None available   ASSESSMENT AND PLAN: Shortness of breath Abnormal EKG, right bundle branch block Risk factors for CAD include gender, hypertension, diabetes, previous smoking history Recommend further evaluation with echocardiogram and pharmacologic nuclear stress test. Patient is unable to walk on the treadmill for muscle weakness. Patient prescribed ASA, and NTG SL prn for chest pain. Patient instructed to call 911 for unrelenting chest pain.  Hypertension BP is well controlled. Continue monitoring BP. Continue current medical therapy and lifestyle changes.  Hyperlipidemia Ideal LDL goal is less than 70 due to diabetes. PCP following labs.   Current medicines are reviewed at length with the patient today.  The patient does not have concerns regarding medicines.  Labs/ tests ordered today include:  Orders Placed This Encounter  Procedures  . NM Myocar Multi W/Spect W/Wall Motion / EF  . EKG 12-Lead  . ECHOCARDIOGRAM COMPLETE    I had a lengthy and detailed discussion with the patient regarding diagnoses, prognosis, diagnostic options, treatment options , and side effects of medications.   I counseled the patient on importance of lifestyle modification including heart healthy diet, regular physical activity once cardiac workup is  completed.   Disposition:   FU with undersigned after tests   Thank you for this consultation. We will forwarding this consultation to referring physician.   Signed, Wende Bushy, MD  04/17/2016 4:37 PM    [place]  This note was generated in part with voice recognition software and I apologize for any typographical errors that were not detected and corrected.

## 2016-04-17 NOTE — Patient Instructions (Signed)
Diabetes Mellitus and Food It is important for you to manage your blood sugar (glucose) level. Your blood glucose level can be greatly affected by what you eat. Eating healthier foods in the appropriate amounts throughout the day at about the same time each day will help you control your blood glucose level. It can also help slow or prevent worsening of your diabetes mellitus. Healthy eating may even help you improve the level of your blood pressure and reach or maintain a healthy weight. General recommendations for healthful eating and cooking habits include:  Eating meals and snacks regularly. Avoid going long periods of time without eating to lose weight.  Eating a diet that consists mainly of plant-based foods, such as fruits, vegetables, nuts, legumes, and whole grains.  Using low-heat cooking methods, such as baking, instead of high-heat cooking methods, such as deep frying.  Work with your dietitian to make sure you understand how to use the Nutrition Facts information on food labels. How can food affect me? Carbohydrates Carbohydrates affect your blood glucose level more than any other type of food. Your dietitian will help you determine how many carbohydrates to eat at each meal and teach you how to count carbohydrates. Counting carbohydrates is important to keep your blood glucose at a healthy level, especially if you are using insulin or taking certain medicines for diabetes mellitus. Alcohol Alcohol can cause sudden decreases in blood glucose (hypoglycemia), especially if you use insulin or take certain medicines for diabetes mellitus. Hypoglycemia can be a life-threatening condition. Symptoms of hypoglycemia (sleepiness, dizziness, and disorientation) are similar to symptoms of having too much alcohol. If your health care provider has given you approval to drink alcohol, do so in moderation and use the following guidelines:  Women should not have more than one drink per day, and men  should not have more than two drinks per day. One drink is equal to: ? 12 oz of beer. ? 5 oz of wine. ? 1 oz of hard liquor.  Do not drink on an empty stomach.  Keep yourself hydrated. Have water, diet soda, or unsweetened iced tea.  Regular soda, juice, and other mixers might contain a lot of carbohydrates and should be counted.  What foods are not recommended? As you make food choices, it is important to remember that all foods are not the same. Some foods have fewer nutrients per serving than other foods, even though they might have the same number of calories or carbohydrates. It is difficult to get your body what it needs when you eat foods with fewer nutrients. Examples of foods that you should avoid that are high in calories and carbohydrates but low in nutrients include:  Trans fats (most processed foods list trans fats on the Nutrition Facts label).  Regular soda.  Juice.  Candy.  Sweets, such as cake, pie, doughnuts, and cookies.  Fried foods.  What foods can I eat? Eat nutrient-rich foods, which will nourish your body and keep you healthy. The food you should eat also will depend on several factors, including:  The calories you need.  The medicines you take.  Your weight.  Your blood glucose level.  Your blood pressure level.  Your cholesterol level.  You should eat a variety of foods, including:  Protein. ? Lean cuts of meat. ? Proteins low in saturated fats, such as fish, egg whites, and beans. Avoid processed meats.  Fruits and vegetables. ? Fruits and vegetables that may help control blood glucose levels, such as apples,   mangoes, and yams.  Dairy products. ? Choose fat-free or low-fat dairy products, such as milk, yogurt, and cheese.  Grains, bread, pasta, and rice. ? Choose whole grain products, such as multigrain bread, whole oats, and brown rice. These foods may help control blood pressure.  Fats. ? Foods containing healthful fats, such as  nuts, avocado, olive oil, canola oil, and fish.  Does everyone with diabetes mellitus have the same meal plan? Because every person with diabetes mellitus is different, there is not one meal plan that works for everyone. It is very important that you meet with a dietitian who will help you create a meal plan that is just right for you. This information is not intended to replace advice given to you by your health care provider. Make sure you discuss any questions you have with your health care provider. Document Released: 12/07/2004 Document Revised: 08/18/2015 Document Reviewed: 02/06/2013 Elsevier Interactive Patient Education  2017 Elsevier Inc.  

## 2016-04-17 NOTE — Progress Notes (Signed)
Patient: Patrick Grant Male    DOB: Dec 07, 1955   61 y.o.   MRN: [id number] Visit Date: 04/17/2016  Today's Provider: Vernie Murders, PA   Chief Complaint  Patient presents with  . Sinusitis  . Dizziness   Subjective:    Sinusitis  This is a new problem. The current episode started 1 to 4 weeks ago. The problem is unchanged. Associated symptoms include congestion and shortness of breath. (Dry mouth,SOB at times and Dizziness ) Past treatments include saline sprays. The treatment provided no relief.   Past Medical History:  Diagnosis Date  . Diabetes mellitus without complication ([place])   . Hypertension    Patient Active Problem List   Diagnosis Date Noted  . Cervical pain 02/08/2015  . Cervical nerve root disorder 02/08/2015  . Complex regional pain syndrome 02/08/2015  . Bleeding from the nose 02/08/2015  . Reflex sympathetic dystrophy of lower extremity 02/08/2015  . Type 2 diabetes mellitus ([place]) 02/08/2015  . Combined fat and carbohydrate induced hyperlipemia 04/06/2008  . Elevated fasting blood sugar 10/10/2007  . Essential (primary) hypertension 09/30/2007   Past Surgical History:  Procedure Laterality Date  . SPINAL CORD STIMULATOR REMOVAL  2010   DUE TO LOSS OF MUSCLE USE WHEN ACTIVATED   Family History  Problem Relation Age of Onset  . CVA Mother   . Kidney failure Mother   . CVA Father   . Kidney failure Father   . Colon cancer Father   . Ovarian cancer Sister   . Diabetes Brother   . Healthy Sister    Allergies  Allergen Reactions  . Fentanyl     Other reaction(s): Other (See Comments) Other Reaction: Other reaction     Previous Medications   GLUCOSE BLOOD (ONE TOUCH ULTRA TEST) TEST STRIP    USE ONE STRIP TO CHECK FASTING BLOOD SUGAR ONCE DAILY   LISINOPRIL (PRINIVIL,ZESTRIL) 10 MG TABLET    TAKE ONE TABLET BY MOUTH ONCE DAILY   METFORMIN (GLUCOPHAGE) 500 MG TABLET    Take 1 tablet (500 mg total) by mouth 2 (two) times daily with a meal.   PRAVASTATIN (PRAVACHOL) 20 MG TABLET    TAKE ONE TABLET BY MOUTH ONCE DAILY    Review of Systems  Constitutional: Negative.   HENT: Positive for congestion.   Respiratory: Positive for shortness of breath.   Cardiovascular: Negative.   Neurological: Positive for dizziness.    Social History  Substance Use Topics  . Smoking status: Former Smoker    Years: 25.00    Types: Cigars    Quit date: 03/12/2016  . Smokeless tobacco: Never Used  . Alcohol use No   Objective:   BP 128/86 (BP Location: Right Arm, Patient Position: Sitting, Cuff Size: Normal)   Pulse 91   Temp 98.3 F (36.8 C) (Oral)   Resp 16   Wt 209 lb 12.8 oz (95.2 kg)   SpO2 97%   BMI 28.45 kg/m  Wt Readings from Last 3 Encounters:  04/17/16 209 lb 12.8 oz (95.2 kg)  04/09/16 216 lb 12.8 oz (98.3 kg)  04/08/16 216 lb (98 kg)    Physical Exam  Constitutional: He is oriented to person, place, and time. He appears well-developed and well-nourished. No distress.  HENT:  Head: Normocephalic and atraumatic.  Right Ear: Hearing normal.  Left Ear: Hearing normal.  Nose: Nose normal.  Eyes: Conjunctivae and lids are normal. Right eye exhibits no discharge. Left eye exhibits no discharge. No scleral icterus.  Neck:  Neck supple.  Cardiovascular: Normal rate and regular rhythm.   Pulmonary/Chest: Effort normal and breath sounds normal. No respiratory distress.  Abdominal: Soft. Bowel sounds are normal.  Lymphadenopathy:    He has no cervical adenopathy.  Neurological: He is alert and oriented to person, place, and time.  Skin: Skin is intact. No lesion and no rash noted.  Psychiatric: His speech is normal. Thought content normal. His mood appears anxious. He is agitated.      Assessment & Plan:     1. Rhinorrhea Some stuffiness and irritation to nasal passages. Some improvement in light headed sensation when bending over now. Recommend Flonase and may use Claritin prn. Gargle with warm saltwater 3-4 times a day  as needed for throat irritation. Recheck prn. - fluticasone (FLONASE) 50 MCG/ACT nasal spray; Place 2 sprays into both nostrils daily.  Dispense: 16 g; Refill: 6  2. Type 2 diabetes mellitus with hyperosmolarity without coma, without long-term current use of insulin (HCC) Presently taking Metformin 500 mg once a day. Recommend checking FBS each morning and get retraining by diabetic nurse regarding diet. Continue present dosabe of Metformin and recheck Hgb A1C in 3 months. - Ambulatory referral to diabetic education

## 2016-04-19 DIAGNOSIS — Z125 Encounter for screening for malignant neoplasm of prostate: Secondary | ICD-10-CM | POA: Insufficient documentation

## 2016-04-19 DIAGNOSIS — N5082 Scrotal pain: Secondary | ICD-10-CM | POA: Insufficient documentation

## 2016-04-19 NOTE — Progress Notes (Signed)
04/20/2016 3:35 PM   Patrick Grant 04-22-1955 [id number]  Referring provider: Margo Grant, [address] [place], [place] [address]  CC: new Patient, right scrotal pain, prostate screening  HPI:  1 - Right Scrotal - Back Pain - Pt with Rt back back that radiates to groin that is worse with lifting / twising. Has known DKD of L spine with "crushed vertebrae" per patient.  Exam completely unremarkable. No masses, cysts, hernias. Scrotal US 02/2016 normal. CT abd/pelvis 02/2016 norma (no stones / hydro / retroperitoneal or groin masses). He has long h/o chronic pain.  2 - Prostate Screening - Pt's father with prostate cancer. 03/2016 DRE 45gm smoth / PSA normal per pt this year (no results noted in epic)  PMH sig for DM2, chronic pain (uses crutches for chronic leg pain).  Today "Patrick Grant " is seen as new patient for above.     PMH: Past Medical History:  Diagnosis Date  . Diabetes mellitus without complication ([place])   . Hypertension     Surgical History: Past Surgical History:  Procedure Laterality Date  . SPINAL CORD STIMULATOR REMOVAL  2010   DUE TO LOSS OF MUSCLE USE WHEN ACTIVATED    Home Medications:  Allergies as of 04/20/2016      Reactions   Fentanyl    Other reaction(s): Other (See Comments) Other Reaction: Other reaction      Medication List       Accurate as of 04/19/16  3:35 PM. Always use your most recent med list.          fluticasone 50 MCG/ACT nasal spray Commonly known as:  FLONASE Place 2 sprays into both nostrils daily.   glucose blood test strip Commonly known as:  ONE TOUCH ULTRA TEST USE ONE STRIP TO CHECK FASTING BLOOD SUGAR ONCE DAILY   lisinopril 10 MG tablet Commonly known as:  PRINIVIL,ZESTRIL TAKE ONE TABLET BY MOUTH ONCE DAILY   metFORMIN 500 MG tablet Commonly known as:  GLUCOPHAGE Take 1 tablet (500 mg total) by mouth 2 (two) times daily with a meal.   nitroGLYCERIN 0.4 MG SL tablet Commonly known as:   NITROSTAT Place 1 tablet (0.4 mg total) under the tongue every 5 (five) minutes as needed.   pravastatin 20 MG tablet Commonly known as:  PRAVACHOL TAKE ONE TABLET BY MOUTH ONCE DAILY       Allergies:  Allergies  Allergen Reactions  . Fentanyl     Other reaction(s): Other (See Comments) Other Reaction: Other reaction    Family History: Family History  Problem Relation Age of Onset  . CVA Mother   . Kidney failure Mother   . CVA Father   . Kidney failure Father   . Colon cancer Father   . Ovarian cancer Sister   . Diabetes Brother   . Healthy Sister     Social History:  reports that he quit smoking about 5 weeks ago. His smoking use included Cigars. He quit after 25.00 years of use. He has never used smokeless tobacco. He reports that he does not drink alcohol or use drugs.  ROS:                                        Physical Exam: There were no vitals taken for this visit.  Constitutional:  Alert and oriented, No acute distress. HEENT: [place] AT, moist mucus membranes.  Trachea midline, no masses. Cardiovascular: No clubbing, cyanosis, or edema. Respiratory: Normal respiratory effort, no increased work of breathing. GI: Abdomen is soft, nontender, nondistended, no abdominal masses GU: No CVA tenderness. Circ'd. Phallus straight. No palpable scrotal masses or localizing tenderness whatsoever.  Skin: No rashes, bruises or suspicious lesions. Lymph: No cervical or inguinal adenopathy. Neurologic: Grossly intact, no focal deficits, moving all 4 extremities. Psychiatric: Normal mood and affect.  Laboratory Data: Lab Results  Component Value Date   WBC 11.0 (H) 04/08/2016   HGB 14.9 04/08/2016   HCT 42.8 04/08/2016   MCV 87.7 04/08/2016   PLT 319 04/08/2016    Lab Results  Component Value Date   CREATININE 0.96 04/08/2016    No results found for: PSA  No results found for: TESTOSTERONE  Lab Results  Component Value Date   HGBA1C 7.1  02/09/2016    Urinalysis    Component Value Date/Time   COLORURINE YELLOW (A) 04/08/2016 2010   APPEARANCEUR CLEAR (A) 04/08/2016 2010   LABSPEC 1.014 04/08/2016 2010   [place] 5.0 04/08/2016 2010   GLUCOSEU NEGATIVE 04/08/2016 2010   [place] NEGATIVE 04/08/2016 2010   BILIRUBINUR NEGATIVE 04/08/2016 2010   BILIRUBINUR Negative 03/16/2016 1522   KETONESUR 5 (A) 04/08/2016 2010   PROTEINUR NEGATIVE 04/08/2016 2010   UROBILINOGEN 0.2 03/16/2016 1522   NITRITE NEGATIVE 04/08/2016 2010   LEUKOCYTESUR NEGATIVE 04/08/2016 2010    Pertinent Imaging: Reviewed as per HPI  Assessment & Plan:     1 - Right Scrotal Pain - no organic etiology on labs, exam, imaging. He was reassured . Suspect another manifestation of chronic pain syndrome and/or L spine DJD. Rec PRN OTC tylenol / motrin, scrotal support, PRN ice. Discussed we do NOT provide narcotics for this indication. He is reassured and in agreement.   Would consider ortho-spiner eval if becomes more botherseom.   2 - Prostate Screening - PSA today then up to date this year. Rec continue anually per PCP until age 41 or so.   RTC if symptoms progress.   Patrick Grant, [facility] [address] [place], [place] [address] [phone]

## 2016-04-20 ENCOUNTER — Ambulatory Visit (INDEPENDENT_AMBULATORY_CARE_PROVIDER_SITE_OTHER): Payer: Medicare Other | Admitting: Urology

## 2016-04-20 ENCOUNTER — Encounter: Payer: Self-pay | Admitting: Urology

## 2016-04-20 VITALS — BP 165/78 | HR 98 | Ht 72.0 in | Wt 209.9 lb

## 2016-04-20 DIAGNOSIS — Z125 Encounter for screening for malignant neoplasm of prostate: Secondary | ICD-10-CM

## 2016-04-20 DIAGNOSIS — N400 Enlarged prostate without lower urinary tract symptoms: Secondary | ICD-10-CM

## 2016-04-20 DIAGNOSIS — N50819 Testicular pain, unspecified: Secondary | ICD-10-CM | POA: Diagnosis not present

## 2016-04-20 LAB — URINALYSIS, COMPLETE
BILIRUBIN UA: NEGATIVE
Glucose, UA: NEGATIVE
Ketones, UA: NEGATIVE
Leukocytes, UA: NEGATIVE
Nitrite, UA: NEGATIVE
PH UA: 5.5 (ref 5.0–7.5)
PROTEIN UA: NEGATIVE
RBC UA: NEGATIVE
SPEC GRAV UA: 1.02 (ref 1.005–1.030)
Urobilinogen, Ur: 0.2 mg/dL (ref 0.2–1.0)

## 2016-04-20 LAB — MICROSCOPIC EXAMINATION
BACTERIA UA: NONE SEEN
Epithelial Cells (non renal): NONE SEEN /hpf (ref 0–10)

## 2016-04-20 NOTE — Addendum Note (Signed)
Addended by: Wilson Singer on: 04/20/2016 01:44 PM   Modules accepted: Orders

## 2016-04-21 ENCOUNTER — Emergency Department: Payer: Medicare Other

## 2016-04-21 ENCOUNTER — Encounter: Payer: Self-pay | Admitting: Emergency Medicine

## 2016-04-21 ENCOUNTER — Emergency Department
Admission: EM | Admit: 2016-04-21 | Discharge: 2016-04-21 | Disposition: A | Discharge status: Home / Self Care | Payer: Medicare Other | Attending: Student in an Organized Health Care Education/Training Program | Admitting: Student in an Organized Health Care Education/Training Program

## 2016-04-21 DIAGNOSIS — R0600 Dyspnea, unspecified: Secondary | ICD-10-CM | POA: Diagnosis not present

## 2016-04-21 DIAGNOSIS — Z79899 Other long term (current) drug therapy: Secondary | ICD-10-CM | POA: Insufficient documentation

## 2016-04-21 DIAGNOSIS — Z7984 Long term (current) use of oral hypoglycemic drugs: Secondary | ICD-10-CM | POA: Insufficient documentation

## 2016-04-21 DIAGNOSIS — Z87891 Personal history of nicotine dependence: Secondary | ICD-10-CM | POA: Diagnosis not present

## 2016-04-21 DIAGNOSIS — R05 Cough: Secondary | ICD-10-CM

## 2016-04-21 DIAGNOSIS — J439 Emphysema, unspecified: Secondary | ICD-10-CM | POA: Diagnosis not present

## 2016-04-21 DIAGNOSIS — E119 Type 2 diabetes mellitus without complications: Secondary | ICD-10-CM | POA: Insufficient documentation

## 2016-04-21 DIAGNOSIS — I10 Essential (primary) hypertension: Secondary | ICD-10-CM | POA: Diagnosis not present

## 2016-04-21 DIAGNOSIS — R059 Cough, unspecified: Secondary | ICD-10-CM

## 2016-04-21 DIAGNOSIS — R0602 Shortness of breath: Secondary | ICD-10-CM | POA: Insufficient documentation

## 2016-04-21 DIAGNOSIS — J438 Other emphysema: Secondary | ICD-10-CM | POA: Diagnosis not present

## 2016-04-21 LAB — COMPREHENSIVE METABOLIC PANEL
ALBUMIN: 4.4 g/dL (ref 3.5–5.0)
ALK PHOS: 69 U/L (ref 38–126)
ALT: 22 U/L (ref 17–63)
ANION GAP: 8 (ref 5–15)
AST: 27 U/L (ref 15–41)
BUN: 22 mg/dL — ABNORMAL HIGH (ref 6–20)
CALCIUM: 9.6 mg/dL (ref 8.9–10.3)
CO2: 20 mmol/L — AB (ref 22–32)
CREATININE: 1.07 mg/dL (ref 0.61–1.24)
Chloride: 105 mmol/L (ref 101–111)
GFR calc Af Amer: >60 mL/min (ref >60)
GFR calc non Af Amer: >60 mL/min (ref >60)
GLUCOSE: 210 mg/dL — AB (ref 65–99)
Potassium: 3.9 mmol/L (ref 3.5–5.1)
SODIUM: 133 mmol/L — AB (ref 135–145)
Total Bilirubin: 0.9 mg/dL (ref 0.3–1.2)
Total Protein: 7.3 g/dL (ref 6.5–8.1)

## 2016-04-21 LAB — CBC WITH DIFFERENTIAL/PLATELET
BASOS ABS: 0 10*3/uL (ref 0–0.1)
BASOS PCT: 0 %
EOS ABS: 0.1 10*3/uL (ref 0–0.7)
Eosinophils Relative: 1 %
HCT: 41.5 % (ref 40.0–52.0)
HEMOGLOBIN: 14.1 g/dL (ref 13.0–18.0)
Lymphocytes Relative: 16 %
Lymphs Abs: 1.6 10*3/uL (ref 1.0–3.6)
MCH: 30 pg (ref 26.0–34.0)
MCHC: 33.9 g/dL (ref 32.0–36.0)
MCV: 88.6 fL (ref 80.0–100.0)
Monocytes Absolute: 0.5 10*3/uL (ref 0.2–1.0)
Monocytes Relative: 5 %
NEUTROS PCT: 78 %
Neutro Abs: 7.6 10*3/uL — ABNORMAL HIGH (ref 1.4–6.5)
Platelets: 289 10*3/uL (ref 150–440)
RBC: 4.69 MIL/uL (ref 4.40–5.90)
RDW: 13 % (ref 11.5–14.5)
WBC: 9.9 10*3/uL (ref 3.8–10.6)

## 2016-04-21 LAB — BRAIN NATRIURETIC PEPTIDE: B Natriuretic Peptide: 14 pg/mL (ref 0.0–100.0)

## 2016-04-21 LAB — PSA: Prostate Specific Ag, Serum: 1 ng/mL (ref 0.0–4.0)

## 2016-04-21 LAB — TROPONIN I: Troponin I: <0.03 ng/mL (ref <0.03)

## 2016-04-21 MED ORDER — ALBUTEROL SULFATE (2.5 MG/3ML) 0.083% IN NEBU
5.0000 mg | INHALATION_SOLUTION | Freq: Once | RESPIRATORY_TRACT | Status: AC
  Administered 2016-04-21: 5 mg via RESPIRATORY_TRACT
  Filled 2016-04-21: qty 6

## 2016-04-21 MED ORDER — DEXAMETHASONE SODIUM PHOSPHATE 10 MG/ML IJ SOLN
10.0000 mg | Freq: Once | INTRAMUSCULAR | Status: AC
  Administered 2016-04-21: 10 mg via INTRAVENOUS
  Filled 2016-04-21: qty 1

## 2016-04-21 MED ORDER — IPRATROPIUM-ALBUTEROL 0.5-2.5 (3) MG/3ML IN SOLN
3.0000 mL | Freq: Once | RESPIRATORY_TRACT | Status: AC
  Administered 2016-04-21: 3 mL via RESPIRATORY_TRACT
  Filled 2016-04-21: qty 3

## 2016-04-21 MED ORDER — IOPAMIDOL (ISOVUE-370) INJECTION 76%
75.0000 mL | Freq: Once | INTRAVENOUS | Status: AC | PRN
  Administered 2016-04-21: 75 mL via INTRAVENOUS

## 2016-04-21 MED ORDER — TIOTROPIUM BROMIDE MONOHYDRATE 18 MCG IN CAPS: 18.0000 ug | ORAL_CAPSULE | Freq: Every day | RESPIRATORY_TRACT | 0 refills | Status: DC

## 2016-04-21 MED ORDER — ALBUTEROL SULFATE HFA 108 (90 BASE) MCG/ACT IN AERS: 2.0000 | INHALATION_SPRAY | Freq: Four times a day (QID) | RESPIRATORY_TRACT | 2 refills | Status: DC | PRN

## 2016-04-21 MED ORDER — BUDESONIDE-FORMOTEROL FUMARATE 80-4.5 MCG/ACT IN AERO: 2.0000 | INHALATION_SPRAY | Freq: Two times a day (BID) | RESPIRATORY_TRACT | 0 refills | Status: DC

## 2016-04-21 MED ORDER — DEXAMETHASONE SODIUM PHOSPHATE 10 MG/ML IJ SOLN: 10.0000 mg | Freq: Once | INTRAMUSCULAR | Status: DC

## 2016-04-21 NOTE — Discharge Instructions (Signed)
Please return if symptoms worsen, you have chest pain, fevers or questions.

## 2016-04-21 NOTE — ED Provider Notes (Signed)
[facility] Emergency Department Provider Note    First MD Initiated Contact with Patient 04/21/16 [phone]     (approximate)  I have reviewed the triage vital signs and the nursing notes.   HISTORY  Chief Complaint Cough    HPI Patrick Grant is a 61 y.o. male with 2-3 weeks of worsening cough and shortness of breath.  Patient is also primarily concerned that his throat is getting dry and that he is not able to clear his sinuses. Recently saw his PCP and was started on Flonase and nasal saline. Has not had any improvement. Denies any fevers. He thinks he may have had some chills. States he did have an episode of hemoptysis last week.  Denies any history of DVT or blood clot.  Not on any antiplatelet or blood thinners.   Past Medical History:  Diagnosis Date  . Diabetes mellitus without complication ([place])   . Hypertension    Family History  Problem Relation Age of Onset  . CVA Mother   . Kidney failure Mother   . CVA Father   . Kidney failure Father   . Colon cancer Father   . Ovarian cancer Sister   . Diabetes Brother   . Healthy Sister    Past Surgical History:  Procedure Laterality Date  . SPINAL CORD STIMULATOR REMOVAL  2010   DUE TO LOSS OF MUSCLE USE WHEN ACTIVATED   Patient Active Problem List   Diagnosis Date Noted  . Scrotal pain, Right 04/19/2016  . Prostate cancer screening 04/19/2016  . Cervical pain 02/08/2015  . Cervical nerve root disorder 02/08/2015  . Complex regional pain syndrome 02/08/2015  . Bleeding from the nose 02/08/2015  . Reflex sympathetic dystrophy of lower extremity 02/08/2015  . Type 2 diabetes mellitus ([place]) 02/08/2015  . Combined fat and carbohydrate induced hyperlipemia 04/06/2008  . Elevated fasting blood sugar 10/10/2007  . Essential (primary) hypertension 09/30/2007      Prior to Admission medications   Medication Sig Start Date End Date Taking? Authorizing Provider  fluticasone (FLONASE) 50  MCG/ACT nasal spray Place 2 sprays into both nostrils daily. 04/17/16   Vickki Muff Chrismon, PA  glucose blood (ONE TOUCH ULTRA TEST) test strip USE ONE STRIP TO CHECK FASTING BLOOD SUGAR ONCE DAILY 04/03/16   Vickki Muff Chrismon, PA  lisinopril (PRINIVIL,ZESTRIL) 10 MG tablet TAKE ONE TABLET BY MOUTH ONCE DAILY 04/09/16   Vickki Muff Chrismon, PA  metFORMIN (GLUCOPHAGE) 500 MG tablet Take 1 tablet (500 mg total) by mouth 2 (two) times daily with a meal. 02/09/16   Vickki Muff Chrismon, PA  nitroGLYCERIN (NITROSTAT) 0.4 MG SL tablet Place 1 tablet (0.4 mg total) under the tongue every 5 (five) minutes as needed. Patient not taking: Reported on 04/20/2016 04/17/16   Wende Bushy, MD  pravastatin (PRAVACHOL) 20 MG tablet TAKE ONE TABLET BY MOUTH ONCE DAILY 04/09/16   Vickki Muff Chrismon, PA    Allergies Fentanyl    Social History Social History  Substance Use Topics  . Smoking status: Former Smoker    Years: 25.00    Types: Cigars    Quit date: 03/12/2016  . Smokeless tobacco: Never Used  . Alcohol use No    Review of Systems Patient denies headaches, rhinorrhea, blurry vision, numbness, shortness of breath, chest pain, edema, cough, abdominal pain, nausea, vomiting, diarrhea, dysuria, fevers, rashes or hallucinations unless otherwise stated above in HPI. ____________________________________________   PHYSICAL EXAM:  VITAL SIGNS: Vitals:   04/21/16 0731  BP: (!) 146/82  Pulse: 96  Resp: 18  Temp: 98 F (36.7 C)    Constitutional: Alert and oriented. Well appearing and in no acute distress. Eyes: Conjunctivae are normal. PERRL. EOMI. Head: Atraumatic. Nose: No congestion/rhinnorhea. Mouth/Throat: Mucous membranes are moist.  Oropharynx non-erythematous. Neck: No stridor. Painless ROM. No cervical spine tenderness to palpation Hematological/Lymphatic/Immunilogical: No cervical lymphadenopathy. Cardiovascular: Normal rate, regular rhythm. Grossly normal heart sounds.  Good peripheral  circulation. Respiratory: Normal respiratory effort.  No retractions. Lungs CTAB. Gastrointestinal: Soft and nontender. No distention. No abdominal bruits. No CVA tenderness. Musculoskeletal: No lower extremity tenderness nor edema.  No joint effusions. Neurologic:  Normal speech and language. No gross focal neurologic deficits are appreciated. No gait instability. Skin:  Skin is warm, dry and intact. No rash noted. Psychiatric: Mood and affect are normal. Speech and behavior are normal.  ____________________________________________   LABS (all labs ordered are listed, but only abnormal results are displayed)  Results for orders placed or performed in visit on 04/20/16 (from the past 24 hour(s))  Urinalysis, Complete     Status: None   Collection Time: 04/20/16 10:38 AM  Result Value Ref Range   Specific Gravity, UA 1.020 1.005 - 1.030   pH, UA 5.5 5.0 - 7.5   Color, UA Yellow Yellow   Appearance Ur Clear Clear   Leukocytes, UA Negative Negative   Protein, UA Negative Negative/Trace   Glucose, UA Negative Negative   Ketones, UA Negative Negative   RBC, UA Negative Negative   Bilirubin, UA Negative Negative   Urobilinogen, Ur 0.2 0.2 - 1.0 mg/dL   Nitrite, UA Negative Negative   Microscopic Examination See below:    Narrative   Performed at:  [facility] [address] [place], [place], [place]  [id number] Lab Director: Colletta Maryland [facility], Phone:  [phone]  Microscopic Examination     Status: None   Collection Time: 04/20/16 10:38 AM  Result Value Ref Range   WBC, UA 0-5 0 - 5 /hpf   RBC, UA 0-2 0 - 2 /hpf   Epithelial Cells (non renal) None seen 0 - 10 /hpf   Bacteria, UA None seen None seen/Few   Narrative   Performed at:  [facility] [address] [place], [place], [place]  [id number] Lab Director: Colletta Maryland [facility], Phone:  [phone]  PSA     Status: None   Collection Time: 04/20/16  2:00 PM  Result Value Ref Range   Prostate  Specific Ag, Serum 1.0 0.0 - 4.0 ng/mL   Narrative   Performed at:  [address] [place], [place]  [id number] Lab Director: Lindon Romp MD, Phone:  [phone]   ____________________________________________  EKG My review and personal interpretation at Time: 7:44   Indication: cough  Rate: 90  Rhythm: sinus Axis: normal Other: rbbb, no sTEMI, otherwise normal intervals. ____________________________________________  RADIOLOGY  I personally reviewed all radiographic images ordered to evaluate for the above acute complaints and reviewed radiology reports and findings.  These findings were personally discussed with the patient.  Please see medical record for radiology report.  ____________________________________________   PROCEDURES  Procedure(s) performed:  Procedures    Critical Care performed: no ____________________________________________   INITIAL IMPRESSION / ASSESSMENT AND PLAN / ED COURSE  Pertinent labs & imaging results that were available during my care of the patient were reviewed by me and considered in my medical decision making (see chart for details).  DDX: Asthma,  copd, CHF, pna, ptx, malignancy, Pe, anemia   Patrick Grant is a 61 y.o. who presents to the ED with chief complaint of cough. Patient arrives afebrile hemodynamically stable. He has no acute hypoxia. Based on duration of symptoms with recent hemoptysis I am concerned for possible etiologies such as malignancy, pneumonia and pulmonary embolism. Seems less consistent with ACS or cardiac origin. EKG shows right bundle branch block but no evidence of acute ischemia.  The patient will be placed on continuous pulse oximetry and telemetry for monitoring.  Laboratory evaluation will be sent to evaluate for the above complaints.     Clinical Course as of Apr 22 1135  Sat Apr 21, 2016  1015 CT imaging with evidence of emphysematous changes. There is no pulmonary embolism.  I do not appreciate any evidence of pneumonia. Patient was able to ambulate with a steady gait and no hypoxia. He did have mild tachycardia during ambulation trial the patient had just received nebulizer treatment. Send for blood gas to confirm the patient does not have any significant hypercapnia but the patient is otherwise well-appearing and the symptoms have been ongoing for over a month.  [PR]  1035 I spoke with Dr. Carren Rang of pulmonology agrees to follow up with patient this week. Has recommended in addition to albuterol starting Symbicort and Spiriva.  Patient was able to tolerate PO and was able to ambulate with a steady gait.  Have discussed with the patient and available family all diagnostics and treatments performed thus far and all questions were answered to the best of my ability. The patient demonstrates understanding and agreement with plan.   [PR]    Clinical Course User Index [PR] Merlyn Lot, MD     ____________________________________________   FINAL CLINICAL IMPRESSION(S) / ED DIAGNOSES  Final diagnoses:  Cough  Pulmonary emphysema, unspecified emphysema type ([place])      NEW MEDICATIONS STARTED DURING THIS VISIT:  New Prescriptions   No medications on file     Note:  This document was prepared using Dragon voice recognition software and may include unintentional dictation errors.    Merlyn Lot, MD 04/21/16 1137

## 2016-04-21 NOTE — ED Notes (Addendum)
This RN ambulated patient in hallway. Patient maintained O2 sat of 99-100 % on room air while ambulating. Patient heart increased while ambulating to max of 122 bmp. Patient reports shortness of breath while walking.   Dr. [place] Cornwall informed of above.

## 2016-04-21 NOTE — ED Triage Notes (Addendum)
Pt reports for the past 2 weeks he has been having dry mouth but no sore throat. Pt also reports he has had labored breathing for the past 2 weeks. Pt reports he recently quit smoking just over a month ago and states sxs have worsened since then.  Pt reports cough occasionally as well. Pt is able to talk in complete sentences without running out of breath. Pt ambulated into lobby. Pt was seen at PCP's office for the same complaint on 1/23 and was started on Flonase and to do salt water gargles.

## 2016-04-22 LAB — BLOOD GAS, ARTERIAL
ACID-BASE DEFICIT: 5.2 mmol/L — AB (ref 0.0–2.0)
Bicarbonate: 17.3 mmol/L — ABNORMAL LOW (ref 20.0–28.0)
FIO2: 0.21
O2 Saturation: 97.5 %
PATIENT TEMPERATURE: 37
PH ART: 7.43 (ref 7.350–7.450)
pCO2 arterial: 26 mmHg — ABNORMAL LOW (ref 32.0–48.0)
pO2, Arterial: 94 mmHg (ref 83.0–108.0)

## 2016-04-24 ENCOUNTER — Telehealth: Payer: Self-pay | Admitting: Family Medicine

## 2016-04-24 ENCOUNTER — Ambulatory Visit: Payer: Medicare Other | Admitting: Urology

## 2016-04-24 NOTE — Telephone Encounter (Signed)
Pt stated he was seen in Er 04/21/16 and was advised to see a pulmonologist. Pt stated he wasn't able to make the appt yet but wanted to know if he should follow up with [name] before or after he sees the pulmonologist. Please advise. Thanks TNP

## 2016-04-24 NOTE — Telephone Encounter (Signed)
LMTCB ED 

## 2016-04-24 NOTE — Telephone Encounter (Signed)
May follow up here before the pulmonologist if any symptoms/problems.

## 2016-04-24 NOTE — Telephone Encounter (Signed)
Done  ED 

## 2016-04-24 NOTE — Telephone Encounter (Signed)
[name],  Please review and let me know if I need to make him an appointment. Thanks ED

## 2016-04-26 DIAGNOSIS — G4710 Hypersomnia, unspecified: Secondary | ICD-10-CM | POA: Diagnosis not present

## 2016-04-26 DIAGNOSIS — J449 Chronic obstructive pulmonary disease, unspecified: Secondary | ICD-10-CM | POA: Diagnosis not present

## 2016-04-26 DIAGNOSIS — R0602 Shortness of breath: Secondary | ICD-10-CM | POA: Diagnosis not present

## 2016-04-27 ENCOUNTER — Ambulatory Visit (INDEPENDENT_AMBULATORY_CARE_PROVIDER_SITE_OTHER): Payer: Medicare Other | Admitting: Family Medicine

## 2016-04-27 ENCOUNTER — Encounter: Payer: Self-pay | Admitting: Family Medicine

## 2016-04-27 VITALS — BP 142/68 | HR 85 | Temp 98.0°F | Resp 18 | Wt 215.0 lb

## 2016-04-27 DIAGNOSIS — J439 Emphysema, unspecified: Secondary | ICD-10-CM | POA: Diagnosis not present

## 2016-04-27 NOTE — Progress Notes (Signed)
Patient: Patrick Grant Male    DOB: [birth year]   61 y.o.   MRN: [id number] Visit Date: 04/27/2016  Today's Provider: Vernie Murders, PA   Chief Complaint  Patient presents with  . Hospitalization Follow-up   Subjective:    HPI  Follow up ER visit  Patient was seen in ER for worsening cough and SOB on 04/21/2016. He was treated for cough and pulmonary emphysema. Treatment for this included started Albuterol, Spiriva, and Symbicort. Patient seen Dr. Humphrey Rolls (pulmonologist) yesterday and he advised patient not to pay OOP for Spiriva and gave him a sample inhaler. Patient is unsure of the name of the medication.  He reports good compliance with treatment. He reports this condition is Improved.  ------------------------------------------------------------------------------------  Patient Active Problem List   Diagnosis Date Noted  . Scrotal pain, Right 04/19/2016  . Prostate cancer screening 04/19/2016  . Cervical pain 02/08/2015  . Cervical nerve root disorder 02/08/2015  . Complex regional pain syndrome 02/08/2015  . Bleeding from the nose 02/08/2015  . Reflex sympathetic dystrophy of lower extremity 02/08/2015  . Type 2 diabetes mellitus ([place]) 02/08/2015  . Combined fat and carbohydrate induced hyperlipemia 04/06/2008  . Elevated fasting blood sugar 10/10/2007  . Essential (primary) hypertension 09/30/2007   Past Surgical History:  Procedure Laterality Date  . SPINAL CORD STIMULATOR REMOVAL  2010   DUE TO LOSS OF MUSCLE USE WHEN ACTIVATED   Family History  Problem Relation Age of Onset  . CVA Mother   . Kidney failure Mother   . CVA Father   . Kidney failure Father   . Colon cancer Father   . Ovarian cancer Sister   . Diabetes Brother   . Healthy Sister    Allergies  Allergen Reactions  . Fentanyl     Other reaction(s): Other (See Comments) Other Reaction: Other reaction     Previous Medications   ALBUTEROL (PROVENTIL HFA;VENTOLIN HFA) 108 (90 BASE) MCG/ACT  INHALER    Inhale 2 puffs into the lungs every 6 (six) hours as needed for wheezing or shortness of breath.   BUDESONIDE-FORMOTEROL (SYMBICORT) 80-4.5 MCG/ACT INHALER    Inhale 2 puffs into the lungs 2 (two) times daily.   FLUTICASONE (FLONASE) 50 MCG/ACT NASAL SPRAY    Place 2 sprays into both nostrils daily.   GLUCOSE BLOOD (ONE TOUCH ULTRA TEST) TEST STRIP    USE ONE STRIP TO CHECK FASTING BLOOD SUGAR ONCE DAILY   LISINOPRIL (PRINIVIL,ZESTRIL) 10 MG TABLET    TAKE ONE TABLET BY MOUTH ONCE DAILY   METFORMIN (GLUCOPHAGE) 500 MG TABLET    Take 1 tablet (500 mg total) by mouth 2 (two) times daily with a meal.   NITROGLYCERIN (NITROSTAT) 0.4 MG SL TABLET    Place 1 tablet (0.4 mg total) under the tongue every 5 (five) minutes as needed.   PRAVASTATIN (PRAVACHOL) 20 MG TABLET    TAKE ONE TABLET BY MOUTH ONCE DAILY   TIOTROPIUM (SPIRIVA HANDIHALER) 18 MCG INHALATION CAPSULE    Place 1 capsule (18 mcg total) into inhaler and inhale daily.    Review of Systems  Constitutional: Negative.   HENT: Negative.   Respiratory: Negative.   Cardiovascular: Negative.     Social History  Substance Use Topics  . Smoking status: Former Smoker    Years: 25.00    Types: Cigars    Quit date: 03/12/2016  . Smokeless tobacco: Never Used  . Alcohol use No   Objective:   BP (!) 142/68 (BP Location:  Right Arm, Patient Position: Sitting, Cuff Size: Normal)   Pulse 85   Temp 98 F (36.7 C) (Oral)   Resp 18   Wt 215 lb (97.5 kg)   SpO2 98%   BMI 29.16 kg/m   Physical Exam  Constitutional: He is oriented to person, place, and time. He appears well-developed and well-nourished.  HENT:  Head: Normocephalic.  Eyes: Conjunctivae are normal.  Neck: Neck supple.  Cardiovascular: Normal rate and regular rhythm.   Pulmonary/Chest: Effort normal and breath sounds normal. He has no wheezes. He has no rales.  Abdominal: Soft. Bowel sounds are normal.  Lymphadenopathy:    He has no cervical adenopathy.    Neurological: He is alert and oriented to person, place, and time.  Psychiatric: He has a normal mood and affect. His behavior is normal. Thought content normal.      Assessment & Plan:     1. Pulmonary emphysema, unspecified emphysema type ([place]) Improved cough and congestion since ER visit and pulmonologist (Dr. Humphrey Rolls) treated him with the Symbicort BID and Spiriva. Has not had to use the Albuterol. Breathing better and no sputum production. No dyspnea. Will get getting follow up with cardiologist for stress test on 05-02-16 and echocardiogram on 05-09-16. Will get follow up pulmonary function test with Dr. Humphrey Rolls on 05-15-16. Increase fluid intake and follow pending test results. Keep follow up appointment to recheck diabetes in May 2018.

## 2016-04-29 ENCOUNTER — Encounter: Payer: Self-pay | Admitting: Emergency Medicine

## 2016-04-29 ENCOUNTER — Emergency Department
Admission: EM | Admit: 2016-04-29 | Discharge: 2016-04-29 | Disposition: A | Discharge status: Home / Self Care | Payer: Medicare Other | Attending: Emergency Medicine | Admitting: Emergency Medicine

## 2016-04-29 ENCOUNTER — Emergency Department: Payer: Medicare Other

## 2016-04-29 DIAGNOSIS — Z7984 Long term (current) use of oral hypoglycemic drugs: Secondary | ICD-10-CM | POA: Insufficient documentation

## 2016-04-29 DIAGNOSIS — E119 Type 2 diabetes mellitus without complications: Secondary | ICD-10-CM | POA: Diagnosis not present

## 2016-04-29 DIAGNOSIS — Z87891 Personal history of nicotine dependence: Secondary | ICD-10-CM | POA: Diagnosis not present

## 2016-04-29 DIAGNOSIS — Z79899 Other long term (current) drug therapy: Secondary | ICD-10-CM | POA: Diagnosis not present

## 2016-04-29 DIAGNOSIS — I10 Essential (primary) hypertension: Secondary | ICD-10-CM | POA: Insufficient documentation

## 2016-04-29 DIAGNOSIS — R0789 Other chest pain: Secondary | ICD-10-CM | POA: Diagnosis not present

## 2016-04-29 DIAGNOSIS — R0602 Shortness of breath: Secondary | ICD-10-CM | POA: Diagnosis present

## 2016-04-29 LAB — CBC
HCT: 40.7 % (ref 40.0–52.0)
Hemoglobin: 13.6 g/dL (ref 13.0–18.0)
MCH: 29.8 pg (ref 26.0–34.0)
MCHC: 33.5 g/dL (ref 32.0–36.0)
MCV: 89.1 fL (ref 80.0–100.0)
Platelets: 337 10*3/uL (ref 150–440)
RBC: 4.57 MIL/uL (ref 4.40–5.90)
RDW: 13 % (ref 11.5–14.5)
WBC: 14.3 10*3/uL — AB (ref 3.8–10.6)

## 2016-04-29 LAB — BASIC METABOLIC PANEL
ANION GAP: 10 (ref 5–15)
BUN: 21 mg/dL — AB (ref 6–20)
CALCIUM: 9.5 mg/dL (ref 8.9–10.3)
CO2: 20 mmol/L — ABNORMAL LOW (ref 22–32)
Chloride: 105 mmol/L (ref 101–111)
Creatinine, Ser: 0.94 mg/dL (ref 0.61–1.24)
GFR calc Af Amer: >60 mL/min (ref >60)
GLUCOSE: 138 mg/dL — AB (ref 65–99)
Potassium: 3.9 mmol/L (ref 3.5–5.1)
SODIUM: 135 mmol/L (ref 135–145)

## 2016-04-29 LAB — TROPONIN I

## 2016-04-29 MED ORDER — PREDNISONE 20 MG PO TABS: 40.0000 mg | ORAL_TABLET | Freq: Every day | ORAL | 0 refills | Status: DC

## 2016-04-29 NOTE — ED Provider Notes (Signed)
[facility] Emergency Department Provider Note  Time seen: 7:46 PM  I have reviewed the triage vital signs and the nursing notes.   HISTORY  Chief Complaint Shortness of Breath    HPI Patrick Grant is a 61 y.o. male with a past medical history of diabetes, hypertension, regional pain syndrome, who presents to the emergency department with shortness breath. According to the patient for the past 2 weeks she has been feeling short of breath. States dry cough. Denies any fever. States mild chest tightness but denies any chest pain. Denies any abdominal pain but he does state foul-smelling bowel movements. Denies any known fever.Patient states he had a bowel movement today and shortly afterwards became more short of breath.  Past Medical History:  Diagnosis Date  . Diabetes mellitus without complication ([place])   . Hypertension     Patient Active Problem List   Diagnosis Date Noted  . Scrotal pain, Right 04/19/2016  . Prostate cancer screening 04/19/2016  . Cervical pain 02/08/2015  . Cervical nerve root disorder 02/08/2015  . Complex regional pain syndrome 02/08/2015  . Bleeding from the nose 02/08/2015  . Reflex sympathetic dystrophy of lower extremity 02/08/2015  . Type 2 diabetes mellitus ([place]) 02/08/2015  . Combined fat and carbohydrate induced hyperlipemia 04/06/2008  . Elevated fasting blood sugar 10/10/2007  . Essential (primary) hypertension 09/30/2007    Past Surgical History:  Procedure Laterality Date  . SPINAL CORD STIMULATOR REMOVAL  2010   DUE TO LOSS OF MUSCLE USE WHEN ACTIVATED    Prior to Admission medications   Medication Sig Start Date End Date Taking? Authorizing Provider  albuterol (PROVENTIL HFA;VENTOLIN HFA) 108 (90 Base) MCG/ACT inhaler Inhale 2 puffs into the lungs every 6 (six) hours as needed for wheezing or shortness of breath. 04/21/16   Merlyn Lot, MD  budesonide-formoterol [facility]) 80-4.5 MCG/ACT inhaler  Inhale 2 puffs into the lungs 2 (two) times daily. 04/21/16   Merlyn Lot, MD  fluticasone (FLONASE) 50 MCG/ACT nasal spray Place 2 sprays into both nostrils daily. 04/17/16   Vickki Muff Chrismon, PA  glucose blood (ONE TOUCH ULTRA TEST) test strip USE ONE STRIP TO CHECK FASTING BLOOD SUGAR ONCE DAILY 04/03/16   Vickki Muff Chrismon, PA  lisinopril (PRINIVIL,ZESTRIL) 10 MG tablet TAKE ONE TABLET BY MOUTH ONCE DAILY 04/09/16   Vickki Muff Chrismon, PA  metFORMIN (GLUCOPHAGE) 500 MG tablet Take 1 tablet (500 mg total) by mouth 2 (two) times daily with a meal. 02/09/16   Vickki Muff Chrismon, PA  nitroGLYCERIN (NITROSTAT) 0.4 MG SL tablet Place 1 tablet (0.4 mg total) under the tongue every 5 (five) minutes as needed. 04/17/16   Wende Bushy, MD  pravastatin (PRAVACHOL) 20 MG tablet TAKE ONE TABLET BY MOUTH ONCE DAILY 04/09/16   Vickki Muff Chrismon, PA  tiotropium (SPIRIVA HANDIHALER) 18 MCG inhalation capsule Place 1 capsule (18 mcg total) into inhaler and inhale daily. Patient not taking: Reported on 04/27/2016 04/21/16 04/21/17  Merlyn Lot, MD    Allergies  Allergen Reactions  . Fentanyl     Other reaction(s): Other (See Comments) Other Reaction: Other reaction    Family History  Problem Relation Age of Onset  . CVA Mother   . Kidney failure Mother   . CVA Father   . Kidney failure Father   . Colon cancer Father   . Ovarian cancer Sister   . Diabetes Brother   . Healthy Sister     Social History Social History  Substance Use  Topics  . Smoking status: Former Smoker    Years: 25.00    Types: Cigars    Quit date: 03/12/2016  . Smokeless tobacco: Never Used  . Alcohol use No    Review of Systems Constitutional: Negative for fever Cardiovascular: Negative for chest pain.Positive for tightness Respiratory:Positive for shortness of breath 2-3 weeks. Gastrointestinal: Negative for abdominal pain, vomiting and diarrhea. Neurological: Negative for headache 10-point ROS otherwise  negative.  ____________________________________________   PHYSICAL EXAM:  VITAL SIGNS: ED Triage Vitals [04/29/16 1858]  Enc Vitals Group     BP (!) 153/78     Pulse Rate 83     Resp 18     Temp 97.7 F (36.5 C)     Temp Source Oral     SpO2 97 %     Weight 215 lb (97.5 kg)     Height 6' (1.829 m)     Head Circumference      Peak Flow      Pain Score 4     Pain Loc      Pain Edu?      Excl. in [place]?     Constitutional: Alert and oriented. Well appearing and in no distress. Eyes: Normal exam ENT   Head: Normocephalic and atraumatic   Mouth/Throat: Mucous membranes are moist. Cardiovascular: Normal rate, regular rhythm. No murmur Respiratory: Normal respiratory effort without tachypnea nor retractions. Breath sounds are clear  Gastrointestinal: Soft and nontender. No distention.  Musculoskeletal: Nontender with normal range of motion in all extremities. Neurologic:  Normal speech and language. No gross focal neurologic deficits  Psychiatric: Mood and affect are normal.   ____________________________________________    EKG  EKG reviewed and interpreted by myself shows normal sinus rhythm at 78 bpm, narrow QRS, normal axis, normal intervals, nonspecific but no concerning ST changes.  ____________________________________________    RADIOLOGY  Chest x-ray negative  ____________________________________________   INITIAL IMPRESSION / ASSESSMENT AND PLAN / ED COURSE  Pertinent labs & imaging results that were available during my care of the patient were reviewed by me and considered in my medical decision making (see chart for details).  Patient presents the emergency department 2-3 weeks of shortness of breath. Overall patient appears very well, no apparent shortness breath, speaking in complete sentences, labs are within normal limits besides a slight leukocytosis. Troponin is negative. Patient does state an occasional dry cough. Possibly bronchitis. We'll  obtain a chest x-ray to further evaluate. Overall patient appears well with well-appearing vitals and normal labs.  Patient's labs are normal besides a slight leukocytosis. Troponin is negative. Chest x-rays negative. Overall the patient appears well. Given the patient's continued shortness of breath we will discharge and a short course of steroids and have the patient follow-up with his primary care physician. Discussed return precautions for any worsening trouble breathing or any chest pain.  ____________________________________________   FINAL CLINICAL IMPRESSION(S) / ED DIAGNOSES  Shortness of breath    Harvest Dark, MD 04/29/16 2111

## 2016-04-29 NOTE — ED Triage Notes (Signed)
Pt presents to ED with c/o shortness breath. Pt states he had a bowel movement earlier today, and pt states "it smelled like somebody that had an abscess tooth", pt states he then became [facility]. Pt is able to speak in complete sentences without difficulty. Pt noted to have been seen on 1/27 with c/o labored breathing at this time.

## 2016-05-02 ENCOUNTER — Encounter
Admission: RE | Admit: 2016-05-02 | Discharge: 2016-05-02 | Disposition: A | Discharge status: Home / Self Care | Payer: Medicare Other | Source: Ambulatory Visit | Attending: Cardiology | Admitting: Cardiology

## 2016-05-02 DIAGNOSIS — R9431 Abnormal electrocardiogram [ECG] [EKG]: Secondary | ICD-10-CM | POA: Insufficient documentation

## 2016-05-02 DIAGNOSIS — R0602 Shortness of breath: Secondary | ICD-10-CM | POA: Diagnosis present

## 2016-05-02 LAB — NM MYOCAR MULTI W/SPECT W/WALL MOTION / EF
CHL CUP RESTING HR STRESS: 72 {beats}/min
CSEPHR: 77 %
CSEPPHR: 126 {beats}/min
LV dias vol: 93 mL (ref 62–150)
LV sys vol: 25 mL
TID: 0.9

## 2016-05-02 MED ORDER — TECHNETIUM TC 99M TETROFOSMIN IV KIT
30.8700 | PACK | Freq: Once | INTRAVENOUS | Status: AC | PRN
  Administered 2016-05-02: 30.87 via INTRAVENOUS

## 2016-05-02 MED ORDER — REGADENOSON 0.4 MG/5ML IV SOLN
0.4000 mg | Freq: Once | INTRAVENOUS | Status: AC
  Administered 2016-05-02: 0.4 mg via INTRAVENOUS

## 2016-05-02 MED ORDER — TECHNETIUM TC 99M TETROFOSMIN IV KIT
13.1700 | PACK | Freq: Once | INTRAVENOUS | Status: AC | PRN
  Administered 2016-05-02: 13.17 via INTRAVENOUS

## 2016-05-09 ENCOUNTER — Other Ambulatory Visit: Payer: Self-pay

## 2016-05-09 ENCOUNTER — Ambulatory Visit (INDEPENDENT_AMBULATORY_CARE_PROVIDER_SITE_OTHER): Payer: Medicare Other

## 2016-05-09 DIAGNOSIS — R9431 Abnormal electrocardiogram [ECG] [EKG]: Secondary | ICD-10-CM

## 2016-05-09 DIAGNOSIS — R0602 Shortness of breath: Secondary | ICD-10-CM | POA: Diagnosis not present

## 2016-05-10 ENCOUNTER — Telehealth: Payer: Self-pay

## 2016-05-10 NOTE — Telephone Encounter (Signed)
Patient is requesting to see a different pulmonologist. He has been seeing Dr. Humphrey Rolls (which [facility] referred him to) and he is not satisfied with care being received.Patrick Grant He wants to know if [name] can put a referral in for a different pulmonologist.   CB# [phone]

## 2016-05-11 NOTE — Telephone Encounter (Signed)
Advised patient to proceed with follow up with Dr. Humphrey Rolls next week for follow up evaluation. He agreed to go back. Was initially upset because of cost of Spiriva and Dr.Khan's office being out of Incruse or Spiriva samples. Told him I would check for any samples here and let him know.

## 2016-05-11 NOTE — Telephone Encounter (Signed)
Patient advised. Patient states he is not willing to give Dr. Humphrey Rolls anymore chances. He said he is going to call and cancel follow up appointment scheduled.  Patient states Dr. Humphrey Rolls did not refill Spiriva and would not give him any more samples. Spiriva was originally ordered at ER.

## 2016-05-11 NOTE — Telephone Encounter (Signed)
Would suggest he give Dr. Humphrey Rolls one more chance at a follow up visit. Did Dr.Khan get the Spiriva prescription approved by the insurance when he told you not to pay out of pocket for it - or- did he give you a different medication?

## 2016-05-14 ENCOUNTER — Telehealth: Payer: Self-pay | Admitting: Cardiology

## 2016-05-14 NOTE — Telephone Encounter (Signed)
Pt calling back, he missed a call about his echo results Please call

## 2016-05-14 NOTE — Telephone Encounter (Signed)
Results given to patient. Notes Recorded by Wende Bushy, MD on 05/11/2016 at 9:39 AM EST ovrall ok (Ao < 4)  He verbalized understanding. He is aware of appt on 05/16/16 at 0900 with Dr Yvone Neu.

## 2016-05-16 ENCOUNTER — Encounter: Payer: Self-pay | Admitting: Cardiology

## 2016-05-16 ENCOUNTER — Ambulatory Visit (INDEPENDENT_AMBULATORY_CARE_PROVIDER_SITE_OTHER): Payer: Medicare Other | Admitting: Cardiology

## 2016-05-16 VITALS — BP 126/70 | HR 79 | Ht 72.0 in | Wt 210.8 lb

## 2016-05-16 DIAGNOSIS — R0602 Shortness of breath: Secondary | ICD-10-CM | POA: Diagnosis not present

## 2016-05-16 DIAGNOSIS — I4510 Unspecified right bundle-branch block: Secondary | ICD-10-CM

## 2016-05-16 DIAGNOSIS — I10 Essential (primary) hypertension: Secondary | ICD-10-CM | POA: Diagnosis not present

## 2016-05-16 DIAGNOSIS — E784 Other hyperlipidemia: Secondary | ICD-10-CM | POA: Diagnosis not present

## 2016-05-16 DIAGNOSIS — E7849 Other hyperlipidemia: Secondary | ICD-10-CM

## 2016-05-16 NOTE — Patient Instructions (Signed)
Medication Instructions:  No changes  Labwork: None ordered   Testing/Procedures: None ordered  Follow-Up: Your physician recommends that you schedule a follow-up appointment as needed. Follow up with your primary care provider.   It was a pleasure seeing you today here in the office. Please do not hesitate to give [place] a call back if you have any further questions. Gu [place], BSN

## 2016-05-16 NOTE — Progress Notes (Signed)
Cardiology Office Note   Date:  05/16/2016   ID:  Patrick Grant 1955/07/13, MRN [id number]  Referring Doctor:  Vernie Murders, PA   Cardiologist:   Wende Bushy, MD   Reason for consultation:  Chief Complaint  Patient presents with  . other    f/u echo/stress. Pt states he is doing well. Reviewed meds with pt verbally.      History of Present Illness: Patrick Grant is a 61 y.o. male who presents for Follow-up after testing. He initially presented for Abnormal EKG, right bundle branch block, shortness of breath.  Since last visit, patient has been doing quite well. Shortness of breath this seems to be stable. No chest pain. He continues to work on weight loss. He will be seeing the dietitian to go over dietary recommendations for his diabetes. He is very conscious about sugar content and sodium content of foods.  Patient denies chest pain, PND, orthopnea, edema. No palpitations. No syncope.  ROS:  Please see the history of present illness. Aside from mentioned under HPI, all other systems are reviewed and negative.    Past Medical History:  Diagnosis Date  . Diabetes mellitus without complication ([place])   . Hypertension     Past Surgical History:  Procedure Laterality Date  . SPINAL CORD STIMULATOR REMOVAL  2010   DUE TO LOSS OF MUSCLE USE WHEN ACTIVATED     reports that he quit smoking about 2 months ago. His smoking use included Cigars. He quit after 25.00 years of use. He has never used smokeless tobacco. He reports that he does not drink alcohol or use drugs.   family history includes CVA in his father and mother; Colon cancer in his father; Diabetes in his brother; Healthy in his sister; Kidney failure in his father and mother; Ovarian cancer in his sister.   Outpatient Medications Prior to Visit  Medication Sig Dispense Refill  . albuterol (PROVENTIL HFA;VENTOLIN HFA) 108 (90 Base) MCG/ACT inhaler Inhale 2 puffs into the lungs every 6 (six) hours  as needed for wheezing or shortness of breath. 1 Inhaler 2  . budesonide-formoterol (SYMBICORT) 80-4.5 MCG/ACT inhaler Inhale 2 puffs into the lungs 2 (two) times daily. 1 Inhaler 0  . fluticasone (FLONASE) 50 MCG/ACT nasal spray Place 2 sprays into both nostrils daily. 16 g 6  . glucose blood (ONE TOUCH ULTRA TEST) test strip USE ONE STRIP TO CHECK FASTING BLOOD SUGAR ONCE DAILY 100 each 4  . lisinopril (PRINIVIL,ZESTRIL) 10 MG tablet TAKE ONE TABLET BY MOUTH ONCE DAILY 90 tablet 3  . metFORMIN (GLUCOPHAGE) 500 MG tablet Take 1 tablet (500 mg total) by mouth 2 (two) times daily with a meal. 60 tablet 6  . nitroGLYCERIN (NITROSTAT) 0.4 MG SL tablet Place 1 tablet (0.4 mg total) under the tongue every 5 (five) minutes as needed. 25 tablet 6  . pravastatin (PRAVACHOL) 20 MG tablet TAKE ONE TABLET BY MOUTH ONCE DAILY 90 tablet 3  . predniSONE (DELTASONE) 20 MG tablet Take 2 tablets (40 mg total) by mouth daily. 10 tablet 0  . tiotropium (SPIRIVA HANDIHALER) 18 MCG inhalation capsule Place 1 capsule (18 mcg total) into inhaler and inhale daily. 30 capsule 0   No facility-administered medications prior to visit.      Allergies: Fentanyl    PHYSICAL EXAM: VS:  BP 126/70 (BP Location: Left Arm, Patient Position: Sitting, Cuff Size: Normal)   Pulse 79   Ht 6' (1.829 m)   Wt 210 lb  12 oz (95.6 kg)   BMI 28.58 kg/m  , Body mass index is 28.58 kg/m. Wt Readings from Last 3 Encounters:  05/16/16 210 lb 12 oz (95.6 kg)  04/29/16 215 lb (97.5 kg)  04/27/16 215 lb (97.5 kg)    GENERAL:  well developed, well nourished,not in acute distress HEENT: normocephalic, pink conjunctivae, anicteric sclerae, no xanthelasma, normal dentition, oropharynx clear NECK:  no neck vein engorgement, JVP normal, no hepatojugular reflux, carotid upstroke brisk and symmetric, no bruit, no thyromegaly, no lymphadenopathy LUNGS:  good respiratory effort, clear to auscultation bilaterally CV:  PMI not displaced, no  thrills, no lifts, S1 and S2 within normal limits, no palpable S3 or S4, no murmurs, no rubs, no gallops ABD:  Soft, nontender, nondistended, normoactive bowel sounds, no abdominal aortic bruit, no hepatomegaly, no splenomegaly MS: nontender back, no kyphosis, no scoliosis, no joint deformities EXT:  2+ DP/PT pulses, no edema, no varicosities, no cyanosis, no clubbing SKIN: warm, nondiaphoretic, normal turgor, no ulcers NEUROPSYCH: alert, oriented to person, place, and time, sensory/motor grossly intact, normal mood, appropriate affect    Recent Labs: 03/22/2016: TSH 0.970 04/21/2016: ALT 22; B Natriuretic Peptide 14.0 04/29/2016: BUN 21; Creatinine, Ser 0.94; Hemoglobin 13.6; Platelets 337; Potassium 3.9; Sodium 135   Lipid Panel    Component Value Date/Time   CHOL 190 03/22/2016 1003   TRIG 115 03/22/2016 1003   HDL 34 (L) 03/22/2016 1003   CHOLHDL 5.6 (H) 03/22/2016 1003   LDLCALC 133 (H) 03/22/2016 1003     Other studies Reviewed:  EKG:  The ekg from 04/17/2016 was personally reviewed by me and it revealed sinus rhythm, 84 BPM, right bundle-branch block. Abnormal EKG.  Additional studies/ records that were reviewed personally reviewed by me today include:  Echo 05/09/2016: - Left ventricle: The cavity size was normal. Wall thickness was at   the upper limits of normal. Systolic function was vigorous. The   estimated ejection fraction was in the range of 65% to 70%.   Possible hypokinesis of the basal-midinferolateral myocardium.   Left ventricular diastolic function parameters were normal. - Aortic root: The aortic root was mildly dilated. - Right ventricle: The cavity size was normal. Systolic function   was normal.  Nuclear stress test 05/02/2016:  Low risk study without evidence of ischemia.  There is a small in size, mild in severity, fixed defect involving the apex and apical anterior segments consistent with apical thinning and attenuation artifact. Scar is felt less  likely.  The left ventricular ejection fraction is hyperdynamic (>65%).    ASSESSMENT AND PLAN: Shortness of breath Abnormal EKG, right bundle branch block As per last visit 04/17/2016, Risk factors for CAD include gender, hypertension, diabetes, previous smoking history Discussed findings of echo and stress test. No evidence of ischemia. Continue risk factor modification. Recommend yearly EKG to follow-up on the right bundle-branch block.  Hypertension Recommend aspirin 81 mg by mouth daily. Blood pressure is within normal limits. Continue blood pressure monitoring. Continue current medical therapy and lifestyle changes.   Hyperlipidemia As per last visit 04/17/2016, Ideal LDL goal is less than 70 due to diabetes. PCP following labs. May consider increasing pravastatin dose to 40 mg daily at bedtime. We'll defer to PCP.   Current medicines are reviewed at length with the patient today.  The patient does not have concerns regarding medicines.  Labs/ tests ordered today include:  No orders of the defined types were placed in this encounter.   I had a lengthy and  detailed discussion with the patient regarding diagnoses, prognosis, diagnostic options, treatment options , and side effects of medications.   I counseled the patient on importance of lifestyle modification including heart healthy diet, regular physical activity.  I spent at least 25 minutes with the patient today and more than 50% of the time was spent counseling the patient and coordinating care.    Disposition:   FU with cardiology prn   Signed, Wende Bushy, MD  05/16/2016 9:45 AM    [place]  This note was generated in part with voice recognition software and I apologize for any typographical errors that were not detected and corrected.

## 2016-05-19 DIAGNOSIS — G4733 Obstructive sleep apnea (adult) (pediatric): Secondary | ICD-10-CM | POA: Diagnosis not present

## 2016-05-21 ENCOUNTER — Ambulatory Visit: Payer: Self-pay | Admitting: Family Medicine

## 2016-05-23 ENCOUNTER — Encounter: Payer: Medicare Other | Attending: Family Medicine | Admitting: *Deleted

## 2016-05-23 ENCOUNTER — Encounter: Payer: Self-pay | Admitting: *Deleted

## 2016-05-23 VITALS — BP 122/76 | Ht 72.0 in | Wt 212.2 lb

## 2016-05-23 DIAGNOSIS — Z713 Dietary counseling and surveillance: Secondary | ICD-10-CM | POA: Diagnosis not present

## 2016-05-23 DIAGNOSIS — E1100 Type 2 diabetes mellitus with hyperosmolarity without nonketotic hyperglycemic-hyperosmolar coma (NKHHC): Secondary | ICD-10-CM | POA: Insufficient documentation

## 2016-05-23 DIAGNOSIS — E119 Type 2 diabetes mellitus without complications: Secondary | ICD-10-CM

## 2016-05-23 NOTE — Patient Instructions (Addendum)
Check blood sugars 1 x day before breakfast OR 2 hrs after supper every day  Exercise: Continue house work for  30  minutes 7 days a week  Eat 3 meals day,  1-2  snacks a day Space meals 4-6 hours apart Complete 3 Day Food Record and bring to next appt  Make an eye doctor appointment  Bring blood sugar records to the next appointment  Return for appointment on:  Wednesday June 06, 2016 at 1:00 pm with [facility] (dietitian)

## 2016-05-23 NOTE — Progress Notes (Signed)
Diabetes Self-Management Education  Visit Type: First/Initial  Appt. Start Time: 1335 Appt. End Time: 1430  05/23/2016  [name], identified by name and date of birth, is a 61 y.o. male with a diagnosis of Diabetes: Type 2.   ASSESSMENT  Blood pressure 122/76, height 6' (1.829 m), weight 212 lb 3.2 oz (96.3 kg). Body mass index is 28.78 kg/m.      Diabetes Self-Management Education - 05/23/16 1449      Visit Information   Visit Type First/Initial     Initial Visit   Diabetes Type Type 2   Are you currently following a meal plan? Yes   What type of meal plan do you follow? "watching what I eat"   Are you taking your medications as prescribed? Yes     Psychosocial Assessment   Patient Belief/Attitude about Diabetes Other (comment)  "Don't think about it"   Self-care barriers None   Self-management support Doctor's office;Friends   Other persons present Friend   Patient Concerns Nutrition/Meal planning;Medication;Problem Solving;Monitoring;Healthy Lifestyle;Weight Control;Glycemic Control   Special Needs None   Preferred Learning Style Auditory;Visual;Hands on   [place] in progress   How often do you need to have someone help you when you read instructions, pamphlets, or other written materials from your doctor or pharmacy? 1 - Never   What is the last grade level you completed in school? 9 years     Pre-Education Assessment   Patient understands the diabetes disease and treatment process. Needs Review   Patient understands incorporating nutritional management into lifestyle. Needs Review   Patient undertands incorporating physical activity into lifestyle. Needs Review   Patient understands using medications safely. Needs Instruction   Patient understands monitoring blood glucose, interpreting and using results Needs Review   Patient understands prevention, detection, and treatment of acute complications. Needs Instruction   Patient  understands prevention, detection, and treatment of chronic complications. Needs Review   Patient understands how to develop strategies to address psychosocial issues. Needs Instruction   Patient understands how to develop strategies to promote health/change behavior. Needs Instruction     Complications   Last HgB A1C per patient/outside source 7.1 %  02/09/16   How often do you check your blood sugar? 1-2 times/day   Fasting Blood glucose range (mg/dL) 70-129  Pt reports FBG's 120's mg/dL.    Have you had a dilated eye exam in the past 12 months? No   Have you had a dental exam in the past 12 months? No   Are you checking your feet? Yes   How many days per week are you checking your feet? 2     Dietary Intake   Breakfast  eggs, wheat toast, occasional hashbrown; cereal and milk   Lunch soup and sandwich   Snack (afternoon) crackers; peanut butter   Dinner baked chicken or fish or pork, occasional beef with corn, green beans, peas, salad   Beverage(s) water, coffee with stevia     Exercise   Exercise Type ADL's  "Housework"   How many days per week to you exercise? 7   How many minutes per day do you exercise? 30   Total minutes per week of exercise 210     Patient Education   Previous Diabetes Education Yes (please comment)  [place] when first diagnosed   Disease state  Explored patient's options for treatment of their diabetes   Nutrition management  Role of diet in the treatment of diabetes and the relationship between  the three main macronutrients and blood glucose level;Reviewed blood glucose goals for pre and post meals and how to evaluate the patients' food intake on their blood glucose level.;Meal timing in regards to the patients' current diabetes medication.   Physical activity and exercise  Role of exercise on diabetes management, blood pressure control and cardiac health.   Medications Reviewed patients medication for diabetes, action, purpose, timing of dose and side  effects.   Monitoring Purpose and frequency of SMBG.;Taught/discussed recording of test results and interpretation of SMBG.;Identified appropriate SMBG and/or A1C goals.   Chronic complications Relationship between chronic complications and blood glucose control;Retinopathy and reason for yearly dilated eye exams   Psychosocial adjustment Identified and addressed patients feelings and concerns about diabetes     Individualized Goals (developed by patient)   Reducing Risk Improve blood sugars Decrease medications Prevent diabetes complications Lose weight Lead a healthier lifestyle Become more fit     Outcomes   Expected Outcomes Demonstrated interest in learning. Expect positive outcomes   Future DMSE 2 wks      Individualized Plan for Diabetes Self-Management Training:   Learning Objective:  Patient will have a greater understanding of diabetes self-management. Patient education plan is to attend individual and/or group sessions per assessed needs and concerns.   Plan:   Patient Instructions  Check blood sugars 1 x day before breakfast OR 2 hrs after supper every day Exercise: Continue house work for  30  minutes 7 days a week Eat 3 meals day,  1-2  snacks a day Space meals 4-6 hours apart Complete 3 Day Food Record and bring to next appt Make an eye doctor appointment Bring blood sugar records to the next appointment Return for appointment on:  Wednesday June 06, 2016 at 1:00 pm with [facility] (dietitian)   Expected Outcomes:  Demonstrated interest in learning. Expect positive outcomes  Education material provided:  General Meal Planning Guidelines Simple Meal Plan  If problems or questions, patient to contact team via:  Johny Drilling, RN, [place], CDE [phone]  Future DSME appointment: 2 wks  June 06, 2016 for appointment with the dietitian

## 2016-05-25 DIAGNOSIS — G4710 Hypersomnia, unspecified: Secondary | ICD-10-CM | POA: Diagnosis not present

## 2016-05-25 DIAGNOSIS — G4733 Obstructive sleep apnea (adult) (pediatric): Secondary | ICD-10-CM | POA: Diagnosis not present

## 2016-05-25 DIAGNOSIS — J449 Chronic obstructive pulmonary disease, unspecified: Secondary | ICD-10-CM | POA: Diagnosis not present

## 2016-05-29 ENCOUNTER — Encounter: Payer: Self-pay | Admitting: Family Medicine

## 2016-06-01 ENCOUNTER — Other Ambulatory Visit: Payer: Self-pay | Admitting: Family Medicine

## 2016-06-01 ENCOUNTER — Telehealth: Payer: Self-pay

## 2016-06-01 DIAGNOSIS — J439 Emphysema, unspecified: Secondary | ICD-10-CM

## 2016-06-01 MED ORDER — BUDESONIDE-FORMOTEROL FUMARATE 80-4.5 MCG/ACT IN AERO: 2.0000 | INHALATION_SPRAY | Freq: Two times a day (BID) | RESPIRATORY_TRACT | 3 refills | Status: DC

## 2016-06-01 NOTE — Telephone Encounter (Signed)
Patient called and states he was at ER on 05/01/16 with SOB issues and he was given Symbicort 80/4.5 to use 2 puffs twice daily and he is not use to this so he did not realize that the dial was telling him how much is left in the inhaler so he ran out last night. He got panicky because this has been helping his breathing a lot and needs a refill or samples something to get this inhaler. Please let him know as soon as possible when this gets sent in. [facility] [address] [facility]

## 2016-06-01 NOTE — Telephone Encounter (Signed)
Pt advised-aa 

## 2016-06-01 NOTE — Telephone Encounter (Signed)
Done

## 2016-06-06 ENCOUNTER — Encounter: Payer: Medicare Other | Attending: Family Medicine | Admitting: Dietician

## 2016-06-06 ENCOUNTER — Encounter: Payer: Self-pay | Admitting: Dietician

## 2016-06-06 VITALS — BP 118/80 | Ht 72.0 in | Wt 212.2 lb

## 2016-06-06 DIAGNOSIS — E119 Type 2 diabetes mellitus without complications: Secondary | ICD-10-CM

## 2016-06-06 DIAGNOSIS — E1100 Type 2 diabetes mellitus with hyperosmolarity without nonketotic hyperglycemic-hyperosmolar coma (NKHHC): Secondary | ICD-10-CM | POA: Insufficient documentation

## 2016-06-06 DIAGNOSIS — Z713 Dietary counseling and surveillance: Secondary | ICD-10-CM | POA: Diagnosis not present

## 2016-06-06 NOTE — Patient Instructions (Signed)
   Check food labels for Total Carbohydrate grams. Anything close to 15grams = 1 carb serving. Goal is 45-60grams for each meal.   Ideal sodium level for a meal (average) is about 500mg , 1500mg  for a day.   OK to use Splenda or sucralose to sweeten foods. Pay careful attention...sucrose is not the same as sucralose.  Keep up your healthy food choices, you are doing a great job!

## 2016-06-06 NOTE — Progress Notes (Signed)
Diabetes Self-Management Education  Visit Type:  Follow-up  Appt. Start Time: 1315 Appt. End Time: 2263  06/06/2016  [name], identified by name and date of birth, is a 61 y.o. male with a diagnosis of Diabetes:  .   ASSESSMENT  Blood pressure 118/80, height 6' (1.829 m), weight 212 lb 3.2 oz (96.3 kg). Body mass index is 28.78 kg/m.       Diabetes Self-Management Education - 33/54/56 2563      Complications   How often do you check your blood sugar? 1-2 times/day  1 time a day, alternating times   Fasting Blood glucose range (mg/dL) 70-129  104-151   Postprandial Blood glucose range (mg/dL) 130-179  112-174   Number of hypoglycemic episodes per month 0   Have you had a dilated eye exam in the past 12 months? No   Have you had a dental exam in the past 12 months? No   Are you checking your feet? Yes   How many days per week are you checking your feet? 1     Dietary Intake   Breakfast 3 meals and 0 snacks daily   Beverage(s) water 5-6 bottles daily; coffee     Exercise   Exercise Type ADL's  unable to go outside in cold weather; does house chores daily   How many days per week to you exercise? 7   How many minutes per day do you exercise? 30   Total minutes per week of exercise 210     Patient Education   Nutrition management  Role of diet in the treatment of diabetes and the relationship between the three main macronutrients and blood glucose level;Food label reading, portion sizes and measuring food.;Meal timing in regards to the patients' current diabetes medication.;Meal options for control of blood glucose level and chronic complications.;Other (comment)  basic meal planning   Monitoring Taught/discussed recording of test results and interpretation of SMBG.   Chronic complications Assessed and discussed foot care and prevention of foot problems     Post-Education Assessment   Patient understands the diabetes disease and treatment process. Demonstrates  understanding / competency   Patient understands incorporating nutritional management into lifestyle. Demonstrates understanding / competency   Patient undertands incorporating physical activity into lifestyle. Demonstrates understanding / competency   Patient understands using medications safely. Demonstrates understanding / competency   Patient understands monitoring blood glucose, interpreting and using results Demonstrates understanding / competency   Patient understands prevention, detection, and treatment of acute complications. Needs Review   Patient understands prevention, detection, and treatment of chronic complications. Needs Review   Patient understands how to develop strategies to address psychosocial issues. Needs Review   Patient understands how to develop strategies to promote health/change behavior. Demonstrates understanding / competency     Outcomes   Program Status Completed      Learning Objective:  Patient will have a greater understanding of diabetes self-management. Patient education plan is to attend individual and/or group sessions per assessed needs and concerns.  Patient has been making significant diet changes in recent months to reduce intake of processed starches and sweets. He reports having lost about 50lbs.     Plan:   Patient Instructions   Check food labels for Total Carbohydrate grams. Anything close to 15grams = 1 carb serving. Goal is 45-60grams for each meal.   Ideal sodium level for a meal (average) is about 500mg , 1500mg  for a day.   OK to use Splenda or sucralose to sweeten  foods. Pay careful attention...sucrose is not the same as sucralose.  Keep up your healthy food choices, you are doing a great job!    Expected Outcomes:  Demonstrated interest in learning. Expect positive outcomes  Education material provided: Plate planner with food lists, Planning a Balanced Meal; Quick and Healthy Meal Ideas  If problems or questions, patient  to contact team via:  Phone

## 2016-06-08 ENCOUNTER — Telehealth: Payer: Self-pay

## 2016-06-08 NOTE — Telephone Encounter (Signed)
Was on hold for several minutes. Will need to try again on Monday. Renaldo Fiddler, CMA

## 2016-06-08 NOTE — Telephone Encounter (Signed)
Pt called stating that his Spiriva prescription that was faxed to [place] is "wrong". Contacted [facility], who transferred me to the pharmacy to verify prescription. I realized that Dr. Merlyn Lot, who is an ER doctor, prescribed the medication. Is it okay to give verbal order to pharmacy as written in chart? [facility] # [phone], and fax is [phone]. Renaldo Fiddler, CMA

## 2016-06-08 NOTE — Telephone Encounter (Signed)
Yes, with 11 refills.

## 2016-06-11 NOTE — Telephone Encounter (Signed)
Prescription called in. Per Joellen Jersey at [facility], medication will be sent out later this week after pt's application is approved. Renaldo Fiddler, CMA

## 2016-06-12 DIAGNOSIS — G4733 Obstructive sleep apnea (adult) (pediatric): Secondary | ICD-10-CM | POA: Diagnosis not present

## 2016-06-13 ENCOUNTER — Encounter: Payer: Self-pay | Admitting: Family Medicine

## 2016-06-21 DIAGNOSIS — R0602 Shortness of breath: Secondary | ICD-10-CM | POA: Diagnosis not present

## 2016-06-21 DIAGNOSIS — G4733 Obstructive sleep apnea (adult) (pediatric): Secondary | ICD-10-CM | POA: Diagnosis not present

## 2016-06-21 DIAGNOSIS — J449 Chronic obstructive pulmonary disease, unspecified: Secondary | ICD-10-CM | POA: Diagnosis not present

## 2016-07-06 ENCOUNTER — Other Ambulatory Visit: Payer: Self-pay

## 2016-07-11 DIAGNOSIS — G4733 Obstructive sleep apnea (adult) (pediatric): Secondary | ICD-10-CM | POA: Diagnosis not present

## 2016-07-24 ENCOUNTER — Encounter: Payer: Self-pay | Admitting: Family Medicine

## 2016-07-24 ENCOUNTER — Ambulatory Visit (INDEPENDENT_AMBULATORY_CARE_PROVIDER_SITE_OTHER): Payer: Medicare Other | Admitting: Family Medicine

## 2016-07-24 VITALS — BP 120/78 | HR 78 | Temp 97.7°F | Wt 230.4 lb

## 2016-07-24 DIAGNOSIS — E782 Mixed hyperlipidemia: Secondary | ICD-10-CM | POA: Diagnosis not present

## 2016-07-24 DIAGNOSIS — I10 Essential (primary) hypertension: Secondary | ICD-10-CM

## 2016-07-24 DIAGNOSIS — E1100 Type 2 diabetes mellitus with hyperosmolarity without nonketotic hyperglycemic-hyperosmolar coma (NKHHC): Secondary | ICD-10-CM | POA: Diagnosis not present

## 2016-07-24 DIAGNOSIS — G4733 Obstructive sleep apnea (adult) (pediatric): Secondary | ICD-10-CM | POA: Diagnosis not present

## 2016-07-24 DIAGNOSIS — Z1159 Encounter for screening for other viral diseases: Secondary | ICD-10-CM

## 2016-07-24 DIAGNOSIS — G90522 Complex regional pain syndrome I of left lower limb: Secondary | ICD-10-CM | POA: Diagnosis not present

## 2016-07-24 LAB — POCT GLYCOSYLATED HEMOGLOBIN (HGB A1C): HEMOGLOBIN A1C: 7

## 2016-07-24 NOTE — Patient Instructions (Signed)

## 2016-07-24 NOTE — Progress Notes (Signed)
Patient: Patrick Grant Male    DOB: 03-14-1956   61 y.o.   MRN: [id number] Visit Date: 07/24/2016  Today's Provider: Vernie Murders, PA   Chief Complaint  Patient presents with  . Diabetes  . Hyperlipidemia  . Hypertension  . Follow-up   Subjective:    HPI  Diabetes Mellitus Type II, Follow-up:   Lab Results  Component Value Date   HGBA1C 7.0 07/24/2016   HGBA1C 7.1 02/09/2016   HGBA1C 7.2 (H) 02/21/2015   Last seen for diabetes 6 months ago.  Management since then includes continue medications. He reports good compliance with treatment. He is not having side effects.  Current symptoms include none  Home blood sugar records: fasting range: 130's   Episodes of hypoglycemia? no   Current Insulin Regimen: none Weight trend: stable Current diet: in general, a "healthy" diet   Current exercise: yard work  ------------------------------------------------------------------------   Hypertension, follow-up:  BP Readings from Last 3 Encounters:  07/24/16 120/78  06/06/16 118/80  05/23/16 122/76    He was last seen for hypertension 6 months ago.  BP at that visit was 132/98. Management since that visit includes continue medication.He reports good compliance with treatment. He is not having side effects.  He is exercising. He is adherent to low salt diet.   Outside blood pressures are not being checked. He is experiencing none.  Patient denies chest pain, chest pressure/discomfort, irregular heart beat and palpitations.   Cardiovascular risk factors include advanced age (older than 15 for men, 41 for women), diabetes mellitus, dyslipidemia, hypertension and male gender.  Use of agents associated with hypertension: none.   ------------------------------------------------------------------------    Lipid/Cholesterol, Follow-up:   Last seen for this 6 months ago.  Management since that visit includes continue medication.  Last Lipid Panel:    Component  Value Date/Time   CHOL 190 03/22/2016 1003   TRIG 115 03/22/2016 1003   HDL 34 (L) 03/22/2016 1003   CHOLHDL 5.6 (H) 03/22/2016 1003   LDLCALC 133 (H) 03/22/2016 1003    He reports good compliance with treatment. He is not having side effects.   Wt Readings from Last 3 Encounters:  07/24/16 230 lb 6.4 oz (104.5 kg)  06/06/16 212 lb 3.2 oz (96.3 kg)  05/23/16 212 lb 3.2 oz (96.3 kg)    ------------------------------------------------------------------------ Past Medical History:  Diagnosis Date  . COPD (chronic obstructive pulmonary disease) ([place])   . CRPS (complex regional pain syndrome)   . Diabetes mellitus without complication ([place])   . Hyperlipidemia   . Hypertension    Past Surgical History:  Procedure Laterality Date  . SPINAL CORD STIMULATOR REMOVAL  2010   DUE TO LOSS OF MUSCLE USE WHEN ACTIVATED   Family History  Problem Relation Age of Onset  . CVA Mother   . Kidney failure Mother   . Diabetes Mother   . CVA Father   . Kidney failure Father   . Colon cancer Father   . Ovarian cancer Sister   . Diabetes Brother   . Healthy Sister    Allergies  Allergen Reactions  . Almond (Diagnostic) Swelling    throat  . Fentanyl Other (See Comments)    Other reaction(s): Other (See Comments) Jittery Other Reaction: Other reaction  . Clove Flavor Other (See Comments)    Loss of vision for 1-2 hrs     Previous Medications   ALBUTEROL (PROVENTIL HFA;VENTOLIN HFA) 108 (90 BASE) MCG/ACT INHALER    Inhale 2 puffs into the  lungs every 6 (six) hours as needed for wheezing or shortness of breath.   BUDESONIDE-FORMOTEROL (SYMBICORT) 80-4.5 MCG/ACT INHALER    Inhale 2 puffs into the lungs 2 (two) times daily.   GLUCOSE BLOOD (ONE TOUCH ULTRA TEST) TEST STRIP    USE ONE STRIP TO CHECK FASTING BLOOD SUGAR ONCE DAILY   LISINOPRIL (PRINIVIL,ZESTRIL) 10 MG TABLET    TAKE ONE TABLET BY MOUTH ONCE DAILY   METFORMIN (GLUCOPHAGE) 500 MG TABLET    Take 1 tablet (500 mg total) by  mouth 2 (two) times daily with a meal.   NITROGLYCERIN (NITROSTAT) 0.4 MG SL TABLET    Place 1 tablet (0.4 mg total) under the tongue every 5 (five) minutes as needed.   PRAVASTATIN (PRAVACHOL) 20 MG TABLET    TAKE ONE TABLET BY MOUTH ONCE DAILY   TIOTROPIUM (SPIRIVA HANDIHALER) 18 MCG INHALATION CAPSULE    Place 1 capsule (18 mcg total) into inhaler and inhale daily.    Review of Systems  Constitutional: Negative.   Respiratory: Negative.   Cardiovascular: Negative.   Endocrine: Negative.   Musculoskeletal: Negative.     Social History  Substance Use Topics  . Smoking status: Former Smoker    Years: 25.00    Types: Cigars    Quit date: 03/12/2016  . Smokeless tobacco: Never Used  . Alcohol use No   Objective:   BP 120/78 (BP Location: Right Arm, Patient Position: Sitting, Cuff Size: Normal)   Pulse 78   Temp 97.7 F (36.5 C) (Oral)   Wt 230 lb 6.4 oz (104.5 kg)   SpO2 98%   BMI 31.25 kg/m   Physical Exam  Constitutional: He is oriented to person, place, and time. He appears well-developed and well-nourished. No distress.  HENT:  Head: Normocephalic and atraumatic.  Right Ear: Hearing and external ear normal.  Left Ear: Hearing and external ear normal.  Nose: Nose normal.  Mouth/Throat: Oropharynx is clear and moist.  Eyes: Conjunctivae and lids are normal. Right eye exhibits no discharge. Left eye exhibits no discharge. No scleral icterus.  Neck: Neck supple.  Cardiovascular: Normal rate and regular rhythm.   Pulmonary/Chest: Effort normal and breath sounds normal. No respiratory distress.  Abdominal: Soft. Bowel sounds are normal.  Musculoskeletal: Normal range of motion. He exhibits tenderness.  Severe pain in the left foot from complex regional pain syndrome for years. Foot will sweat easily and over react to minor stimuli. Normal sensation in the right foot. No ulcerations or calluses.  Neurological: He is alert and oriented to person, place, and time.  Skin:  Skin is intact. No lesion and no rash noted.  Psychiatric: He has a normal mood and affect. His speech is normal and behavior is normal. Thought content normal.      Assessment & Plan:     1. Type 2 diabetes mellitus with hyperosmolarity without coma, without long-term current use of insulin ([place]) Diagnosed in 2009. Tolerating Metformin 500 mg qd and Hgb A1C down to 7.0% and FBS in the 130 range at home. Feeling well and following diet better (counseling with nutritionist). Recheck labs and follow up in 6 months for CPE. Will get eye exam this fall and foot exam completed today (right foot normal - unable to examine left foot due to complex region pain syndrome). - POCT HgB A1C - CBC with Differential/Platelet - Comprehensive metabolic panel - Lipid panel  2. Essential (primary) hypertension Stable and well controlled with Lisinopril 10 mg qd. Recheck labs and follow  up prn. - CBC with Differential/Platelet - Comprehensive metabolic panel - Lipid panel - TSH  3. Combined fat and carbohydrate induced hyperlipemia Tolerating Pravastatin 20 mg qd without side effects. Continue low fat diabetic diet and recheck labs. - Comprehensive metabolic panel - Lipid panel - TSH  4. Complex regional pain syndrome type 1 of left lower extremity Unchanged severe pain in the left foot. Diagnosed in 2008 following a fracture in the foot. Continues to use a crutch for ambulation. Unable to wear shoes due to pain. Recheck routine labs and follow up pending reports. - CBC with Differential/Platelet  5. Obstructive sleep apnea syndrome Continues to follow up with pulmonologist since starting CPAP 1 month ago. Sleeping well and good energy level. Breathing better and not having to use inhalers as much.   6. Need for hepatitis C screening test - Hepatitis C Antibody

## 2016-08-08 DIAGNOSIS — G4733 Obstructive sleep apnea (adult) (pediatric): Secondary | ICD-10-CM | POA: Diagnosis not present

## 2016-08-27 DIAGNOSIS — R0602 Shortness of breath: Secondary | ICD-10-CM | POA: Diagnosis not present

## 2016-08-27 DIAGNOSIS — G4733 Obstructive sleep apnea (adult) (pediatric): Secondary | ICD-10-CM | POA: Diagnosis not present

## 2016-08-27 DIAGNOSIS — J449 Chronic obstructive pulmonary disease, unspecified: Secondary | ICD-10-CM | POA: Diagnosis not present

## 2016-09-03 ENCOUNTER — Other Ambulatory Visit: Payer: Self-pay | Admitting: Family Medicine

## 2016-09-03 MED ORDER — METFORMIN HCL 500 MG PO TABS: 500.0000 mg | ORAL_TABLET | Freq: Two times a day (BID) | ORAL | 3 refills | Status: DC

## 2016-09-03 NOTE — Telephone Encounter (Signed)
[facility] faxed a refill request on the following medications:  metFORMIN (GLUCOPHAGE) 500 MG tablet.  Take 1 tablet by mouth twice daily with meals.  90 day supply.    [address]/[facility]

## 2016-09-08 ENCOUNTER — Encounter: Payer: Self-pay | Admitting: Family Medicine

## 2016-11-16 ENCOUNTER — Ambulatory Visit (INDEPENDENT_AMBULATORY_CARE_PROVIDER_SITE_OTHER): Payer: Medicare Other | Admitting: Family Medicine

## 2016-11-16 ENCOUNTER — Encounter: Payer: Self-pay | Admitting: Family Medicine

## 2016-11-16 VITALS — BP 112/70 | HR 78 | Temp 98.2°F | Resp 16 | Wt 223.4 lb

## 2016-11-16 DIAGNOSIS — R0981 Nasal congestion: Secondary | ICD-10-CM

## 2016-11-16 DIAGNOSIS — E1100 Type 2 diabetes mellitus with hyperosmolarity without nonketotic hyperglycemic-hyperosmolar coma (NKHHC): Secondary | ICD-10-CM

## 2016-11-16 LAB — POCT GLYCOSYLATED HEMOGLOBIN (HGB A1C): HEMOGLOBIN A1C: 10.8

## 2016-11-16 MED ORDER — GLIPIZIDE 5 MG PO TABS: 5.0000 mg | ORAL_TABLET | Freq: Two times a day (BID) | ORAL | 3 refills | Status: DC

## 2016-11-16 NOTE — Patient Instructions (Signed)
We will call you with the referral information. Let Patrick Grant know if glipizide not helping.

## 2016-11-16 NOTE — Progress Notes (Signed)
Subjective:     Patient ID: CASON LUFFMAN, male   DOB: Feb 07, 1956, 61 y.o.   MRN: [id number]  HPI  Chief Complaint  Patient presents with  . Diabetes    Patient returns to office today to follow up for his diabetes, patient states for the past 3+weeks he has had high blood sugar reads at home >200 fasting. Patient reports that he has episodes of dizziness on a daily basis, patient denies any changes to medications or diet. Last HgbA1C was 07/24/16 at 7.0%.   Accompanied by his friend, [name]. Reports compliance with metformin. Also wishes referral to  ENT due to chronic sinus congestion. States has tried several topical nasal preparations without improvement.   Review of Systems     Objective:   Physical Exam  Constitutional: He appears well-developed and well-nourished. No distress.       Assessment:    1. Type 2 diabetes mellitus with hyperosmolarity without coma, without long-term current use of insulin ([place]): Add second medication for improved control. - POCT glycosylated hemoglobin (Hb A1C) - glipiZIDE (GLUCOTROL) 5 MG tablet; Take 1 tablet (5 mg total) by mouth 2 (two) times daily before a meal. Take 30 minutes before your two biggest meals of the day.  Dispense: 60 tablet; Refill: 3  2. Nasal sinus congestion; chronic - Ambulatory referral to ENT    Plan:    F/u with his primary provider if sugar not improving and as scheduled.

## 2016-11-30 DIAGNOSIS — R42 Dizziness and giddiness: Secondary | ICD-10-CM | POA: Diagnosis not present

## 2016-11-30 DIAGNOSIS — J342 Deviated nasal septum: Secondary | ICD-10-CM | POA: Diagnosis not present

## 2016-11-30 DIAGNOSIS — J301 Allergic rhinitis due to pollen: Secondary | ICD-10-CM | POA: Diagnosis not present

## 2016-12-03 ENCOUNTER — Encounter: Payer: Self-pay | Admitting: Family Medicine

## 2016-12-03 ENCOUNTER — Other Ambulatory Visit: Payer: Self-pay | Admitting: Family Medicine

## 2016-12-03 MED ORDER — GLUCOSE BLOOD VI STRP: ORAL_STRIP | 4 refills | Status: DC

## 2016-12-11 ENCOUNTER — Encounter: Payer: Self-pay | Admitting: Family Medicine

## 2016-12-14 ENCOUNTER — Other Ambulatory Visit: Payer: Self-pay | Admitting: Family Medicine

## 2016-12-14 NOTE — Telephone Encounter (Signed)
Check with [facility] regarding refill of Metformin sent on 09-03-16 for 500 mg BID #180 and THREE refills. Chart indicates they receive it but patient being told he has no refills available. If they don't have it on fill, we will resend it.

## 2016-12-24 ENCOUNTER — Encounter: Payer: Self-pay | Admitting: Family Medicine

## 2016-12-25 ENCOUNTER — Encounter: Payer: Self-pay | Admitting: Family Medicine

## 2016-12-25 ENCOUNTER — Other Ambulatory Visit: Payer: Self-pay | Admitting: Family Medicine

## 2016-12-25 DIAGNOSIS — J449 Chronic obstructive pulmonary disease, unspecified: Secondary | ICD-10-CM

## 2016-12-25 MED ORDER — TIOTROPIUM BROMIDE MONOHYDRATE 18 MCG IN CAPS: 18.0000 ug | ORAL_CAPSULE | Freq: Every day | RESPIRATORY_TRACT | 11 refills | Status: DC

## 2017-01-02 DIAGNOSIS — J342 Deviated nasal septum: Secondary | ICD-10-CM | POA: Diagnosis not present

## 2017-01-02 DIAGNOSIS — J301 Allergic rhinitis due to pollen: Secondary | ICD-10-CM | POA: Diagnosis not present

## 2017-01-09 ENCOUNTER — Ambulatory Visit (INDEPENDENT_AMBULATORY_CARE_PROVIDER_SITE_OTHER): Payer: Medicare Other

## 2017-01-09 DIAGNOSIS — Z23 Encounter for immunization: Secondary | ICD-10-CM | POA: Diagnosis not present

## 2017-01-10 DIAGNOSIS — G4733 Obstructive sleep apnea (adult) (pediatric): Secondary | ICD-10-CM | POA: Diagnosis not present

## 2017-01-10 DIAGNOSIS — J449 Chronic obstructive pulmonary disease, unspecified: Secondary | ICD-10-CM | POA: Diagnosis not present

## 2017-01-10 DIAGNOSIS — R0602 Shortness of breath: Secondary | ICD-10-CM | POA: Diagnosis not present

## 2017-01-14 ENCOUNTER — Telehealth: Payer: Self-pay | Admitting: Family Medicine

## 2017-01-25 ENCOUNTER — Ambulatory Visit (INDEPENDENT_AMBULATORY_CARE_PROVIDER_SITE_OTHER): Payer: Medicare Other | Admitting: Family Medicine

## 2017-01-25 ENCOUNTER — Encounter: Payer: Self-pay | Admitting: Family Medicine

## 2017-01-25 VITALS — BP 134/78 | HR 96 | Temp 97.7°F | Resp 16 | Wt 215.0 lb

## 2017-01-25 DIAGNOSIS — I10 Essential (primary) hypertension: Secondary | ICD-10-CM | POA: Diagnosis not present

## 2017-01-25 DIAGNOSIS — E782 Mixed hyperlipidemia: Secondary | ICD-10-CM | POA: Diagnosis not present

## 2017-01-25 DIAGNOSIS — Z114 Encounter for screening for human immunodeficiency virus [HIV]: Secondary | ICD-10-CM | POA: Diagnosis not present

## 2017-01-25 DIAGNOSIS — M25511 Pain in right shoulder: Secondary | ICD-10-CM

## 2017-01-25 DIAGNOSIS — G90522 Complex regional pain syndrome I of left lower limb: Secondary | ICD-10-CM | POA: Diagnosis not present

## 2017-01-25 DIAGNOSIS — Z1159 Encounter for screening for other viral diseases: Secondary | ICD-10-CM | POA: Diagnosis not present

## 2017-01-25 DIAGNOSIS — E1100 Type 2 diabetes mellitus with hyperosmolarity without nonketotic hyperglycemic-hyperosmolar coma (NKHHC): Secondary | ICD-10-CM | POA: Diagnosis not present

## 2017-01-25 DIAGNOSIS — Z1211 Encounter for screening for malignant neoplasm of colon: Secondary | ICD-10-CM

## 2017-01-25 LAB — POCT UA - MICROALBUMIN: MICROALBUMIN (UR) POC: 20 mg/L

## 2017-01-25 NOTE — Progress Notes (Signed)
Patient: Patrick Grant Male    DOB: 07/16/1955   61 y.o.   MRN: [id number] Visit Date: 01/25/2017  Today's Provider: Vernie Murders, PA   Chief Complaint  Patient presents with  . Diabetes  . Hypertension  . Hyperlipidemia   Subjective:    HPI     Diabetes Mellitus Type II, Follow-up:   Lab Results  Component Value Date   HGBA1C 10.8 11/16/2016   HGBA1C 7.0 07/24/2016   HGBA1C 7.1 02/09/2016   Last seen for diabetes 2 months ago.  Management since then includes adding Glipizide 5 mg BID. He reports fair compliance with treatment. He states his stopped taking the Glipizide because it caused his BS to drop in the 60's. Current symptoms include weight loss and have been stable. Home blood sugar records: fasting range: 130's  Episodes of hypoglycemia? None since stopping Glipizide.   Current Insulin Regimen: N/A Most Recent Eye Exam: pt is due Weight trend: decreasing steadily Prior visit with dietician: yes - [facility] in early 2018. Current diet: in general, a "healthy" diet   Current exercise: none. Pt states it's hard to exercise on the winter due to joint stiffness from weather.  ------------------------------------------------------------------------   Hypertension, follow-up:  BP Readings from Last 3 Encounters:  01/25/17 134/78  11/16/16 112/70  07/24/16 120/78    He was last seen for hypertension 6 months ago.  BP at that visit was 120/78. Management since that visit includes none. Marland KitchenHe reports good compliance with treatment. He is not having side effects.  He is not exercising. He is adherent to low salt diet.   Outside blood pressures are stable per pt. He is experiencing dyspnea and fatigue. (Pt has COPD.) Patient denies chest pain, chest pressure/discomfort, claudication, exertional chest pressure/discomfort, irregular heart beat, lower extremity edema, near-syncope, orthopnea, palpitations and syncope.   Cardiovascular risk  factors include advanced age (older than 5 for men, 15 for women), diabetes mellitus, dyslipidemia, hypertension and male gender.     ------------------------------------------------------------------------    Lipid/Cholesterol, Follow-up:   Last seen for this 6 months ago.  Management since that visit includes encouraging diet and exercise, and ordering labs (not done; pt states he was not given lab requisition).  Last Lipid Panel:    Component Value Date/Time   CHOL 190 03/22/2016 1003   TRIG 115 03/22/2016 1003   HDL 34 (L) 03/22/2016 1003   CHOLHDL 5.6 (H) 03/22/2016 1003   LDLCALC 133 (H) 03/22/2016 1003    He reports good compliance with treatment. He is not having side effects.   Wt Readings from Last 3 Encounters:  01/25/17 215 lb (97.5 kg)  11/16/16 223 lb 6.4 oz (101.3 kg)  07/24/16 230 lb 6.4 oz (104.5 kg)    ------------------------------------------------------------------------  Past Medical History:  Diagnosis Date  . COPD (chronic obstructive pulmonary disease) ([place])   . CRPS (complex regional pain syndrome)   . Diabetes mellitus without complication ([place])   . Hyperlipidemia   . Hypertension    Past Surgical History:  Procedure Laterality Date  . SPINAL CORD STIMULATOR REMOVAL  2010   DUE TO LOSS OF MUSCLE USE WHEN ACTIVATED   Family History  Problem Relation Age of Onset  . CVA Mother   . Kidney failure Mother   . Diabetes Mother   . CVA Father   . Kidney failure Father   . Colon cancer Father   . Ovarian cancer Sister   . Diabetes Brother   .  Healthy Sister    Allergies  Allergen Reactions  . Almond (Diagnostic) Swelling    throat  . Fentanyl Other (See Comments)    Other reaction(s): Other (See Comments) Jittery Other Reaction: Other reaction  . Clove Flavor Other (See Comments)    Loss of vision for 1-2 hrs    Current Outpatient Prescriptions:  .  albuterol (PROVENTIL HFA;VENTOLIN HFA) 108 (90 Base) MCG/ACT inhaler, Inhale 2  puffs into the lungs every 6 (six) hours as needed for wheezing or shortness of breath., Disp: 1 Inhaler, Rfl: 2 .  budesonide-formoterol (SYMBICORT) 80-4.5 MCG/ACT inhaler, Inhale 2 puffs into the lungs 2 (two) times daily., Disp: 1 Inhaler, Rfl: 3 .  glucose blood (ONE TOUCH ULTRA TEST) test strip, CHECK FASTING BLOOD SUGAR TWICE DAILY (before breakfast and supper)., Disp: 200 each, Rfl: 4 .  lisinopril (PRINIVIL,ZESTRIL) 10 MG tablet, TAKE ONE TABLET BY MOUTH ONCE DAILY, Disp: 90 tablet, Rfl: 3 .  metFORMIN (GLUCOPHAGE) 500 MG tablet, Take 1 tablet (500 mg total) by mouth 2 (two) times daily with a meal., Disp: 180 tablet, Rfl: 3 .  nitroGLYCERIN (NITROSTAT) 0.4 MG SL tablet, Place 1 tablet (0.4 mg total) under the tongue every 5 (five) minutes as needed., Disp: 25 tablet, Rfl: 6 .  pravastatin (PRAVACHOL) 20 MG tablet, TAKE ONE TABLET BY MOUTH ONCE DAILY, Disp: 90 tablet, Rfl: 3 .  tiotropium (SPIRIVA HANDIHALER) 18 MCG inhalation capsule, Place 1 capsule (18 mcg total) into inhaler and inhale daily., Disp: 30 capsule, Rfl: 11  Review of Systems  Constitutional: Positive for activity change and fatigue. Negative for appetite change, chills, diaphoresis, fever and unexpected weight change.  Respiratory: Positive for shortness of breath. Negative for cough.   Cardiovascular: Positive for leg swelling. Negative for chest pain and palpitations.  Endocrine: Negative for polydipsia, polyphagia and polyuria.  Musculoskeletal: Positive for arthralgias.  Neurological: Positive for light-headedness.    Social History  Substance Use Topics  . Smoking status: Former Smoker    Years: 25.00    Types: Cigars    Quit date: 03/12/2016  . Smokeless tobacco: Never Used  . Alcohol use No   Objective:   BP 134/78 (BP Location: Right Arm, Patient Position: Sitting, Cuff Size: Large)   Pulse 96   Temp 97.7 F (36.5 C) (Oral)   Resp 16   Wt 215 lb (97.5 kg)   BMI 29.16 kg/m  Vitals:   01/25/17 1340    BP: 134/78  Pulse: 96  Resp: 16  Temp: 97.7 F (36.5 C)  TempSrc: Oral  Weight: 215 lb (97.5 kg)     Physical Exam  Constitutional: He is oriented to person, place, and time. He appears well-developed and well-nourished.  HENT:  Head: Normocephalic and atraumatic.  Right Ear: External ear normal.  Left Ear: External ear normal.  Nose: Nose normal.  Mouth/Throat: Oropharynx is clear and moist.  Eyes: Pupils are equal, round, and reactive to light. Conjunctivae and EOM are normal. Right eye exhibits no discharge.  Neck: Normal range of motion. Neck supple. No tracheal deviation present. No thyromegaly present.  Cardiovascular: Normal rate, regular rhythm, normal heart sounds and intact distal pulses.   No murmur heard. Pulmonary/Chest: Effort normal and breath sounds normal. No respiratory distress. He has no wheezes. He has no rales. He exhibits no tenderness.  Abdominal: Soft. He exhibits no distension and no mass. There is no tenderness. There is no rebound and no guarding.  Musculoskeletal: He exhibits no edema or tenderness.  Severe  pain and sensitivity in the left foot from past injury causing complex regional pain syndrome. Decreased ROM in the right shoulder and inability to reach above shoulder level. Good UE strength. Walks with crutches due to foot pain.  Lymphadenopathy:    He has no cervical adenopathy.  Neurological: He is alert and oriented to person, place, and time. He has normal reflexes. No cranial nerve deficit. He exhibits normal muscle tone. Coordination normal.  Skin: Skin is warm and dry. No rash noted. No erythema.  Faint pink rash on face and chin from use of a new CPAP mask.  Psychiatric: He has a normal mood and affect. His behavior is normal. Judgment and thought content normal.      Assessment & Plan:     1. Type 2 diabetes mellitus with hyperosmolarity without coma, without long-term current use of insulin (HCC) Last Hgb A1C on 11-16-16 was 10.8%.  Still on Metformin 500 mg BID without side effects and trying to control diet. Unable to exercise due to chronic left foot pain syndrome. Microalbumen was 20 mg/L and still on the Lisinopril 10 mg qd. Decided not to take the Glipizide when recommended to take the 5 mg BID on 11-16-16 because of hypoglycemic symptoms (weakness and sweats). Will recheck labs. May need additional medications pending results. - POCT UA - Microalbumin - Hemoglobin A1c - CBC with Differential/Platelet - Comprehensive metabolic panel - Lipid panel  2. Essential (primary) hypertension Stable on the Lisinopril. Diagnosed in 2009. Recheck routine labs and follow up pending reports. - CBC with Differential/Platelet - Comprehensive metabolic panel - Lipid panel - TSH  3. Complex regional pain syndrome type 1 of left lower extremity Unchanged pain syndrome in the left foot diagnosed in 2008 following a fracture in the left foot. Has to use crutches to walk and unable to wear shoes due to constant severe pain. Was on heavy duty narcotic analgesics in the past but decided to stop them due to fear of addiction.   4. Combined fat and carbohydrate induced hyperlipemia Tolerating Pravastatin 20 mg qd. Continue low fat diet and recheck labs. - Comprehensive metabolic panel - Lipid panel - TSH  5. Colon cancer screening - Ambulatory referral to Gastroenterology  6. Need for hepatitis C screening test - Hepatitis C antibody  7. Screening for HIV (human immunodeficiency virus) - HIV antibody  8. Right shoulder pain, unspecified chronicity Having right shoulder pain that is an acute pain to reach over head and throbbing ache at rest the past several weeks. No specific injury known. No help from Advil or Aleve. Heating pad helps. Recommended x-ray evaluation but wants to do it when he return for his AWE. Place a future order.      Vernie Murders, PA  [facility]

## 2017-02-01 ENCOUNTER — Other Ambulatory Visit: Payer: Self-pay | Admitting: Family Medicine

## 2017-02-01 MED ORDER — ALBUTEROL SULFATE HFA 108 (90 BASE) MCG/ACT IN AERS: 2.0000 | INHALATION_SPRAY | Freq: Four times a day (QID) | RESPIRATORY_TRACT | 2 refills | Status: DC | PRN

## 2017-02-01 NOTE — Telephone Encounter (Signed)
[facility] faxed a request for the following prescription. Thanks [facility]  albuterol (PROVENTIL HFA;VENTOLIN HFA) 108 (90 Base) MCG/ACT inhaler

## 2017-02-06 ENCOUNTER — Ambulatory Visit (INDEPENDENT_AMBULATORY_CARE_PROVIDER_SITE_OTHER): Payer: Medicare Other

## 2017-02-06 VITALS — BP 158/84 | HR 100 | Temp 98.0°F | Ht 72.0 in | Wt 227.8 lb

## 2017-02-06 DIAGNOSIS — E1100 Type 2 diabetes mellitus with hyperosmolarity without nonketotic hyperglycemic-hyperosmolar coma (NKHHC): Secondary | ICD-10-CM | POA: Diagnosis not present

## 2017-02-06 DIAGNOSIS — Z114 Encounter for screening for human immunodeficiency virus [HIV]: Secondary | ICD-10-CM | POA: Diagnosis not present

## 2017-02-06 DIAGNOSIS — Z0000 Encounter for general adult medical examination without abnormal findings: Secondary | ICD-10-CM

## 2017-02-06 DIAGNOSIS — I10 Essential (primary) hypertension: Secondary | ICD-10-CM | POA: Diagnosis not present

## 2017-02-06 DIAGNOSIS — Z1159 Encounter for screening for other viral diseases: Secondary | ICD-10-CM | POA: Diagnosis not present

## 2017-02-06 DIAGNOSIS — E782 Mixed hyperlipidemia: Secondary | ICD-10-CM | POA: Diagnosis not present

## 2017-02-06 NOTE — Patient Instructions (Signed)
Patrick Grant , Thank you for taking time to come for your Medicare Wellness Visit. I appreciate your ongoing commitment to your health goals. Please review the following plan we discussed and let me know if I can assist you in the future.   Screening recommendations/referrals: Colonoscopy: declined Recommended yearly ophthalmology/optometry visit for glaucoma screening and checkup Recommended yearly dental visit for hygiene and checkup  Vaccinations: Influenza vaccine: completed Pneumococcal vaccine: N/A Tdap vaccine: declined Shingles vaccine: N/A  Advanced directives: Advance directive discussed with you today. I have provided a copy for you to complete at home and have notarized. Once this is complete please bring a copy in to our office so we can scan it into your chart.  Conditions/risks identified: Fall risk prevention- recommend placing hand rails and grab bars in and outside home to prevent further falls.   Next appointment: Pt awaiting CB from clinic before setting up next apt.  Preventive Care 40-64 Years, Male Preventive care refers to lifestyle choices and visits with your health care provider that can promote health and wellness. What does preventive care include?  A yearly physical exam. This is also called an annual well check.  Dental exams once or twice a year.  Routine eye exams. Ask your health care provider how often you should have your eyes checked.  Personal lifestyle choices, including:  Daily care of your teeth and gums.  Regular physical activity.  Eating a healthy diet.  Avoiding tobacco and drug use.  Limiting alcohol use.  Practicing safe sex.  Taking low-dose aspirin every day starting at age 84. What happens during an annual well check? The services and screenings done by your health care provider during your annual well check will depend on your age, overall health, lifestyle risk factors, and family history of disease. Counseling  Your  health care provider may ask you questions about your:  Alcohol use.  Tobacco use.  Drug use.  Emotional well-being.  Home and relationship well-being.  Sexual activity.  Eating habits.  Work and work Statistician. Screening  You may have the following tests or measurements:  Height, weight, and BMI.  Blood pressure.  Lipid and cholesterol levels. These may be checked every 5 years, or more frequently if you are over 81 years old.  Skin check.  Lung cancer screening. You may have this screening every year starting at age 8 if you have a 30-pack-year history of smoking and currently smoke or have quit within the past 15 years.  Fecal occult blood test (FOBT) of the stool. You may have this test every year starting at age 46.  Flexible sigmoidoscopy or colonoscopy. You may have a sigmoidoscopy every 5 years or a colonoscopy every 10 years starting at age 4.  Prostate cancer screening. Recommendations will vary depending on your family history and other risks.  Hepatitis C blood test.  Hepatitis B blood test.  Sexually transmitted disease (STD) testing.  Diabetes screening. This is done by checking your blood sugar (glucose) after you have not eaten for a while (fasting). You may have this done every 1-3 years. Discuss your test results, treatment options, and if necessary, the need for more tests with your health care provider. Vaccines  Your health care provider may recommend certain vaccines, such as:  Influenza vaccine. This is recommended every year.  Tetanus, diphtheria, and acellular pertussis (Tdap, Td) vaccine. You may need a Td booster every 10 years.  Zoster vaccine. You may need this after age 47.  Pneumococcal 13-valent  conjugate (PCV13) vaccine. You may need this if you have certain conditions and have not been vaccinated.  Pneumococcal polysaccharide (PPSV23) vaccine. You may need one or two doses if you smoke cigarettes or if you have certain  conditions. Talk to your health care provider about which screenings and vaccines you need and how often you need them. This information is not intended to replace advice given to you by your health care provider. Make sure you discuss any questions you have with your health care provider. Document Released: 04/08/2015 Document Revised: 11/30/2015 Document Reviewed: 01/11/2015 Elsevier Interactive Patient Education  2017 [place] Prevention in the Home Falls can cause injuries. They can happen to people of all ages. There are many things you can do to make your home safe and to help prevent falls. What can I do on the outside of my home?  Regularly fix the edges of walkways and driveways and fix any cracks.  Remove anything that might make you trip as you walk through a door, such as a raised step or threshold.  Trim any bushes or trees on the path to your home.  Use bright outdoor lighting.  Clear any walking paths of anything that might make someone trip, such as rocks or tools.  Regularly check to see if handrails are loose or broken. Make sure that both sides of any steps have handrails.  Any raised decks and porches should have guardrails on the edges.  Have any leaves, snow, or ice cleared regularly.  Use sand or salt on walking paths during winter.  Clean up any spills in your garage right away. This includes oil or grease spills. What can I do in the bathroom?  Use night lights.  Install grab bars by the toilet and in the tub and shower. Do not use towel bars as grab bars.  Use non-skid mats or decals in the tub or shower.  If you need to sit down in the shower, use a plastic, non-slip stool.  Keep the floor dry. Clean up any water that spills on the floor as soon as it happens.  Remove soap buildup in the tub or shower regularly.  Attach bath mats securely with double-sided non-slip rug tape.  Do not have throw rugs and other things on the floor that  can make you trip. What can I do in the bedroom?  Use night lights.  Make sure that you have a light by your bed that is easy to reach.  Do not use any sheets or blankets that are too big for your bed. They should not hang down onto the floor.  Have a firm chair that has side arms. You can use this for support while you get dressed.  Do not have throw rugs and other things on the floor that can make you trip. What can I do in the kitchen?  Clean up any spills right away.  Avoid walking on wet floors.  Keep items that you use a lot in easy-to-reach places.  If you need to reach something above you, use a strong step stool that has a grab bar.  Keep electrical cords out of the way.  Do not use floor polish or wax that makes floors slippery. If you must use wax, use non-skid floor wax.  Do not have throw rugs and other things on the floor that can make you trip. What can I do with my stairs?  Do not leave any items on the stairs.  Make sure that there are handrails on both sides of the stairs and use them. Fix handrails that are broken or loose. Make sure that handrails are as long as the stairways.  Check any carpeting to make sure that it is firmly attached to the stairs. Fix any carpet that is loose or worn.  Avoid having throw rugs at the top or bottom of the stairs. If you do have throw rugs, attach them to the floor with carpet tape.  Make sure that you have a light switch at the top of the stairs and the bottom of the stairs. If you do not have them, ask someone to add them for you. What else can I do to help prevent falls?  Wear shoes that:  Do not have high heels.  Have rubber bottoms.  Are comfortable and fit you well.  Are closed at the toe. Do not wear sandals.  If you use a stepladder:  Make sure that it is fully opened. Do not climb a closed stepladder.  Make sure that both sides of the stepladder are locked into place.  Ask someone to hold it for  you, if possible.  Clearly mark and make sure that you can see:  Any grab bars or handrails.  First and last steps.  Where the edge of each step is.  Use tools that help you move around (mobility aids) if they are needed. These include:  Canes.  Walkers.  Scooters.  Crutches.  Turn on the lights when you go into a dark area. Replace any light bulbs as soon as they burn out.  Set up your furniture so you have a clear path. Avoid moving your furniture around.  If any of your floors are uneven, fix them.  If there are any pets around you, be aware of where they are.  Review your medicines with your doctor. Some medicines can make you feel dizzy. This can increase your chance of falling. Ask your doctor what other things that you can do to help prevent falls. This information is not intended to replace advice given to you by your health care provider. Make sure you discuss any questions you have with your health care provider. Document Released: 01/06/2009 Document Revised: 08/18/2015 Document Reviewed: 04/16/2014 Elsevier Interactive Patient Education  2017 [employer].

## 2017-02-06 NOTE — Progress Notes (Signed)
Subjective:   Patrick Grant is a 61 y.o. male who presents for Medicare Annual/Subsequent preventive examination.  Review of Systems:  N/A  Cardiac Risk Factors include: advanced age (>54men, >69 women);diabetes mellitus;dyslipidemia;hypertension;male gender;obesity (BMI >30kg/m2)     Objective:    Vitals: BP (!) 158/84 (BP Location: Left Arm)   Pulse 100   Temp 98 F (36.7 C) (Oral)   Ht 6' (1.829 m)   Wt 227 lb 12.8 oz (103.3 kg)   BMI 30.90 kg/m   Body mass index is 30.9 kg/m.  Tobacco Social History   Tobacco Use  Smoking Status Former Smoker  . Years: 25.00  . Types: Cigars  . Last attempt to quit: 03/12/2016  . Years since quitting: 0.9  Smokeless Tobacco Never Used     Counseling given: Not Answered   Past Medical History:  Diagnosis Date  . COPD (chronic obstructive pulmonary disease) ([place])   . CRPS (complex regional pain syndrome)   . Diabetes mellitus without complication ([place])   . Hyperlipidemia   . Hypertension    Past Surgical History:  Procedure Laterality Date  . SPINAL CORD STIMULATOR REMOVAL  2010   DUE TO LOSS OF MUSCLE USE WHEN ACTIVATED   Family History  Problem Relation Age of Onset  . CVA Mother   . Kidney failure Mother   . Diabetes Mother   . CVA Father   . Kidney failure Father   . Colon cancer Father   . Ovarian cancer Sister   . Diabetes Brother   . Healthy Sister    Social History   Substance and Sexual Activity  Sexual Activity Not on file    Outpatient Encounter Medications as of 02/06/2017  Medication Sig  . albuterol (PROVENTIL HFA;VENTOLIN HFA) 108 (90 Base) MCG/ACT inhaler Inhale 2 puffs every 6 (six) hours as needed into the lungs for wheezing or shortness of breath.  Marland Kitchen azelastine (ASTELIN) 0.1 % nasal spray Place 2 (two) times daily into both nostrils.   . budesonide-formoterol (SYMBICORT) 80-4.5 MCG/ACT inhaler Inhale 2 puffs into the lungs 2 (two) times daily.  . fluticasone (FLONASE) 50 MCG/ACT  nasal spray Place daily into both nostrils.   Marland Kitchen glucose blood (ONE TOUCH ULTRA TEST) test strip CHECK FASTING BLOOD SUGAR TWICE DAILY (before breakfast and supper).  Marland Kitchen lisinopril (PRINIVIL,ZESTRIL) 10 MG tablet TAKE ONE TABLET BY MOUTH ONCE DAILY  . metFORMIN (GLUCOPHAGE) 500 MG tablet Take 1 tablet (500 mg total) by mouth 2 (two) times daily with a meal.  . nitroGLYCERIN (NITROSTAT) 0.4 MG SL tablet Place 1 tablet (0.4 mg total) under the tongue every 5 (five) minutes as needed.  . pravastatin (PRAVACHOL) 20 MG tablet TAKE ONE TABLET BY MOUTH ONCE DAILY  . tiotropium (SPIRIVA HANDIHALER) 18 MCG inhalation capsule Place 1 capsule (18 mcg total) into inhaler and inhale daily.   No facility-administered encounter medications on file as of 02/06/2017.     Activities of Daily Living In your present state of health, do you have any difficulty performing the following activities: 02/06/2017  Hearing? N  Vision? Y  Comment loss of sight in left eye  Difficulty concentrating or making decisions? Y  Walking or climbing stairs? Y  Comment due to knee pain  Dressing or bathing? Y  Comment friend assists with this  Doing errands, shopping? Y  Comment does not Physiological scientist and eating ? Y  Using the Toilet? N  In the past six months, have you accidently leaked urine?  N  Do you have problems with loss of bowel control? N  Managing your Medications? N  Managing your Finances? N  Housekeeping or managing your Housekeeping? Y  Some recent data might be hidden    Patient Care Team: Chrismon, Vickki Muff, PA as PCP - General (Family Medicine) Allyne Gee, MD as Consulting Physician (Internal Medicine)   Assessment:     Exercise Activities and Dietary recommendations Current Exercise Habits: The patient does not participate in regular exercise at present, Exercise limited by: orthopedic condition(s);respiratory conditions(s)(due to osteoarthritis and COPD)  Goals    None     Fall  Risk Fall Risk  02/06/2017 06/06/2016 05/23/2016 01/31/2016  Falls in the past year? Yes No No Yes  Number falls in past yr: 2 or more - - 2 or more  Injury with Fall? Yes - - No  Comment - - - bruising  Risk Factor Category  High Fall Risk - - -  Risk for fall due to : History of fall(s) Impaired balance/gait Impaired mobility;Impaired balance/gait -  Risk for fall due to: Comment - - uses crutches -  Follow up Falls prevention discussed - - -   Depression Screen PHQ 2/9 Scores 02/06/2017 05/23/2016 01/31/2016  PHQ - 2 Score 6 0 4  PHQ- 9 Score 19 - 12    Cognitive Function: Pt declined screening today.     6CIT Screen 01/31/2016  What Year? 0 points  What month? 3 points  What time? 0 points  Count back from 20 0 points  Months in reverse 4 points  Repeat phrase 6 points  Total Score 13    Immunization History  Administered Date(s) Administered  . Influenza,inj,Quad PF,6+ Mos 01/01/2014, 02/05/2015, 01/31/2016, 01/09/2017  . Pneumococcal Polysaccharide-23 01/09/2017   Screening Tests Health Maintenance  Topic Date Due  . Hepatitis C Screening  1955/05/28  . HIV Screening  11/29/1972  . TETANUS/TDAP  11/29/1976  . COLONOSCOPY  11/30/2007  . OPHTHALMOLOGY EXAM  07/24/2017 (Originally 11/30/1967)  . HEMOGLOBIN A1C  05/19/2017  . FOOT EXAM  07/24/2017  . PNEUMOCOCCAL POLYSACCHARIDE VACCINE (2) 01/09/2022  . INFLUENZA VACCINE  Completed      Plan:  I have personally reviewed and addressed the Medicare Annual Wellness questionnaire and have noted the following in the patient's chart:  A. Medical and social history B. Use of alcohol, tobacco or illicit drugs  C. Current medications and supplements D. Functional ability and status E.  Nutritional status F.  Physical activity G. Advance directives H. List of other physicians I.  Hospitalizations, surgeries, and ER visits in previous 12 months J.  [place] such as hearing and vision if needed, cognitive and  depression L. Referrals and appointments - none  In addition, I have reviewed and discussed with patient certain preventive protocols, quality metrics, and best practice recommendations. A written personalized care plan for preventive services as well as general preventive health recommendations were provided to patient.  See attached scanned questionnaire for additional information.   Signed,  Fabio Neighbors, LPN Nurse Health Advisor   MD Recommendations: Pt states he is currently in the process of setting up a colonoscopy. Pt declined the tdap vaccine, Hep C lab and HIV lab. Pt would like to have the blood work done at the next visit (had other blood work done this AM). Extra walker was given to pt today to assist with ambulation at home.   Reviewed the Nurse Health Advisor's note and was available for consultation.  Agree with documentation and plan. Vickki Muff Chrismon, PA-C

## 2017-02-07 ENCOUNTER — Encounter: Payer: Self-pay | Admitting: Family Medicine

## 2017-02-07 ENCOUNTER — Other Ambulatory Visit: Payer: Self-pay

## 2017-02-07 LAB — CBC WITH DIFFERENTIAL/PLATELET
BASOS PCT: 0.6 %
Basophils Absolute: 50 cells/uL (ref 0–200)
EOS PCT: 2.4 %
Eosinophils Absolute: 199 cells/uL (ref 15–500)
HCT: 42.7 % (ref 38.5–50.0)
Hemoglobin: 14.5 g/dL (ref 13.2–17.1)
Lymphs Abs: 2357 cells/uL (ref 850–3900)
MCH: 29.8 pg (ref 27.0–33.0)
MCHC: 34 g/dL (ref 32.0–36.0)
MCV: 87.9 fL (ref 80.0–100.0)
MONOS PCT: 5.9 %
MPV: 11.2 fL (ref 7.5–12.5)
NEUTROS PCT: 62.7 %
Neutro Abs: 5204 cells/uL (ref 1500–7800)
PLATELETS: 296 10*3/uL (ref 140–400)
RBC: 4.86 10*6/uL (ref 4.20–5.80)
RDW: 11.9 % (ref 11.0–15.0)
TOTAL LYMPHOCYTE: 28.4 %
WBC: 8.3 10*3/uL (ref 3.8–10.8)
WBCMIX: 490 {cells}/uL (ref 200–950)

## 2017-02-07 LAB — COMPLETE METABOLIC PANEL WITH GFR
AG RATIO: 1.8 (calc) (ref 1.0–2.5)
ALBUMIN MSPROF: 4.4 g/dL (ref 3.6–5.1)
ALKALINE PHOSPHATASE (APISO): 84 U/L (ref 40–115)
ALT: 28 U/L (ref 9–46)
AST: 16 U/L (ref 10–35)
BILIRUBIN TOTAL: 0.6 mg/dL (ref 0.2–1.2)
BUN: 14 mg/dL (ref 7–25)
CHLORIDE: 103 mmol/L (ref 98–110)
CO2: 23 mmol/L (ref 20–32)
CREATININE: 0.92 mg/dL (ref 0.70–1.33)
Calcium: 9.1 mg/dL (ref 8.6–10.3)
GFR, Est African American: 105 mL/min/{1.73_m2} (ref >=60)
GFR, Est Non African American: 91 mL/min/{1.73_m2} (ref >=60)
GLOBULIN: 2.4 g/dL (ref 1.9–3.7)
Glucose, Bld: 218 mg/dL — ABNORMAL HIGH (ref 65–99)
POTASSIUM: 4.5 mmol/L (ref 3.5–5.3)
SODIUM: 135 mmol/L (ref 135–146)
Total Protein: 6.8 g/dL (ref 6.1–8.1)

## 2017-02-07 LAB — LIPID PANEL
CHOLESTEROL: 155 mg/dL (ref <200)
HDL: 48 mg/dL (ref >40)
LDL CHOLESTEROL (CALC): 86 mg/dL
Non-HDL Cholesterol (Calc): 107 mg/dL (calc) (ref <130)
TRIGLYCERIDES: 116 mg/dL (ref <150)
Total CHOL/HDL Ratio: 3.2 (calc) (ref <5.0)

## 2017-02-07 LAB — HEPATITIS C ANTIBODY
Hepatitis C Ab: NONREACTIVE
SIGNAL TO CUT-OFF: 0.01 (ref <1.00)

## 2017-02-07 LAB — HEMOGLOBIN A1C
HEMOGLOBIN A1C: 9.3 %{Hb} — AB (ref <5.7)
Mean Plasma Glucose: 220 (calc)
eAG (mmol/L): 12.2 (calc)

## 2017-02-07 LAB — TSH: TSH: 1 m[IU]/L (ref 0.40–4.50)

## 2017-02-07 LAB — HIV ANTIBODY (ROUTINE TESTING W REFLEX): HIV 1&2 Ab, 4th Generation: NONREACTIVE

## 2017-02-07 MED ORDER — CANAGLIFLOZIN 100 MG PO TABS: 100.0000 mg | ORAL_TABLET | Freq: Every day | ORAL | 0 refills | Status: DC

## 2017-02-07 NOTE — Progress Notes (Signed)
Invokana 100 mg sent to [facility]. No samples available. Okay per [name] to sent RX.

## 2017-02-13 DIAGNOSIS — G4733 Obstructive sleep apnea (adult) (pediatric): Secondary | ICD-10-CM | POA: Diagnosis not present

## 2017-02-22 ENCOUNTER — Ambulatory Visit (INDEPENDENT_AMBULATORY_CARE_PROVIDER_SITE_OTHER): Payer: Medicare Other | Admitting: Family Medicine

## 2017-02-22 ENCOUNTER — Encounter: Payer: Self-pay | Admitting: Family Medicine

## 2017-02-22 VITALS — BP 144/88 | HR 84 | Temp 98.3°F | Wt 227.0 lb

## 2017-02-22 DIAGNOSIS — E1100 Type 2 diabetes mellitus with hyperosmolarity without nonketotic hyperglycemic-hyperosmolar coma (NKHHC): Secondary | ICD-10-CM | POA: Diagnosis not present

## 2017-02-22 DIAGNOSIS — G90522 Complex regional pain syndrome I of left lower limb: Secondary | ICD-10-CM | POA: Diagnosis not present

## 2017-02-22 MED ORDER — METFORMIN HCL 1000 MG PO TABS: 1000.0000 mg | ORAL_TABLET | Freq: Two times a day (BID) | ORAL | 3 refills | Status: DC

## 2017-02-22 NOTE — Progress Notes (Signed)
Patient: Patrick Grant Male    DOB: [birth year]   61 y.o.   MRN: [id number] Visit Date: 02/22/2017  Today's Provider: Vernie Murders, PA   Chief Complaint  Patient presents with  . Diabetes  . Follow-up   Subjective:    HPI  Diabetes Mellitus Type II, Follow-up:   Lab Results  Component Value Date   HGBA1C 9.3 (H) 02/06/2017   HGBA1C 10.8 11/16/2016   HGBA1C 7.0 07/24/2016    Last seen for diabetes 2 weeks ago.  Management since then includes advised patient to stay on Metformin 500 mg BID and on 02/07/17 added Invokana 100 mg daily. He reports poor compliance with treatment. He reports that he did not start Invokana.  He is not having side effects.  Current symptoms include none Home blood sugar records: fasting range: 200's  Episodes of hypoglycemia? no   Current Insulin Regimen: none Most Recent Eye Exam: due Weight trend: decreasing steadily Prior visit with dietician: yes - 2018 [place]  Current diet: in general, a "healthy" diet   Current exercise: none due to cold weather and joint stiffness  Pertinent Labs:    Component Value Date/Time   CHOL 155 02/06/2017 0940   CHOL 190 03/22/2016 1003   TRIG 116 02/06/2017 0940   HDL 48 02/06/2017 0940   HDL 34 (L) 03/22/2016 1003   LDLCALC 133 (H) 03/22/2016 1003   CREATININE 0.92 02/06/2017 0940    Wt Readings from Last 3 Encounters:  02/22/17 227 lb (103 kg)  02/06/17 227 lb 12.8 oz (103.3 kg)  01/25/17 215 lb (97.5 kg)    ------------------------------------------------------------------------  Past Medical History:  Diagnosis Date  . COPD (chronic obstructive pulmonary disease) ([place])   . CRPS (complex regional pain syndrome)   . Diabetes mellitus without complication ([place])   . Hyperlipidemia   . Hypertension    Past Surgical History:  Procedure Laterality Date  . SPINAL CORD STIMULATOR REMOVAL  2010   DUE TO LOSS OF MUSCLE USE WHEN ACTIVATED   Family History  Problem  Relation Age of Onset  . CVA Mother   . Kidney failure Mother   . Diabetes Mother   . CVA Father   . Kidney failure Father   . Colon cancer Father   . Ovarian cancer Sister   . Diabetes Brother   . Healthy Sister    Allergies  Allergen Reactions  . Almond (Diagnostic) Swelling    throat  . Fentanyl Other (See Comments)    Other reaction(s): Other (See Comments) Jittery Other Reaction: Other reaction  . Clove Flavor Other (See Comments)    Loss of vision for 1-2 hrs    Current Outpatient Medications:  .  albuterol (PROVENTIL HFA;VENTOLIN HFA) 108 (90 Base) MCG/ACT inhaler, Inhale 2 puffs every 6 (six) hours as needed into the lungs for wheezing or shortness of breath., Disp: 1 Inhaler, Rfl: 2 .  azelastine (ASTELIN) 0.1 % nasal spray, Place 2 (two) times daily into both nostrils. , Disp: , Rfl:  .  budesonide-formoterol (SYMBICORT) 80-4.5 MCG/ACT inhaler, Inhale 2 puffs into the lungs 2 (two) times daily., Disp: 1 Inhaler, Rfl: 3 .  fluticasone (FLONASE) 50 MCG/ACT nasal spray, Place daily into both nostrils. , Disp: , Rfl:  .  glucose blood (ONE TOUCH ULTRA TEST) test strip, CHECK FASTING BLOOD SUGAR TWICE DAILY (before breakfast and supper)., Disp: 200 each, Rfl: 4 .  lisinopril (PRINIVIL,ZESTRIL) 10 MG tablet, TAKE ONE TABLET BY MOUTH ONCE DAILY,  Disp: 90 tablet, Rfl: 3 .  metFORMIN (GLUCOPHAGE) 500 MG tablet, Take 1 tablet (500 mg total) by mouth 2 (two) times daily with a meal., Disp: 180 tablet, Rfl: 3 .  nitroGLYCERIN (NITROSTAT) 0.4 MG SL tablet, Place 1 tablet (0.4 mg total) under the tongue every 5 (five) minutes as needed., Disp: 25 tablet, Rfl: 6 .  pravastatin (PRAVACHOL) 20 MG tablet, TAKE ONE TABLET BY MOUTH ONCE DAILY, Disp: 90 tablet, Rfl: 3 .  tiotropium (SPIRIVA HANDIHALER) 18 MCG inhalation capsule, Place 1 capsule (18 mcg total) into inhaler and inhale daily., Disp: 30 capsule, Rfl: 11  Review of Systems  Constitutional: Negative.   Respiratory: Negative.     Cardiovascular: Negative.   Musculoskeletal:       Left foot and leg pain from CRPS.  Psychiatric/Behavioral: Positive for sleep disturbance.    Social History   Tobacco Use  . Smoking status: Former Smoker    Years: 25.00    Types: Cigars    Last attempt to quit: 03/12/2016    Years since quitting: 0.9  . Smokeless tobacco: Never Used  Substance Use Topics  . Alcohol use: No    Alcohol/week: 0.0 oz   Objective:   BP (!) 144/88 (BP Location: Right Arm, Patient Position: Sitting, Cuff Size: Normal)   Pulse 84   Temp 98.3 F (36.8 C) (Oral)   Wt 227 lb (103 kg)   SpO2 98%   BMI 30.79 kg/m    Physical Exam  Constitutional: He is oriented to person, place, and time. He appears well-developed and well-nourished. No distress.  HENT:  Head: Normocephalic and atraumatic.  Right Ear: Hearing normal.  Left Ear: Hearing normal.  Nose: Nose normal.  Eyes: Conjunctivae and lids are normal. Right eye exhibits no discharge. Left eye exhibits no discharge. No scleral icterus.  Neck: Neck supple.  Cardiovascular: Regular rhythm.  Pulmonary/Chest: Effort normal and breath sounds normal. No respiratory distress.  Abdominal: Soft. Bowel sounds are normal.  Musculoskeletal: He exhibits tenderness.  Extreme pain in the left foot and lower leg secondary to CRPS. Good pulses on the right foot/lower leg.   Neurological: He is alert and oriented to person, place, and time.  Skin: Skin is intact. No lesion and no rash noted.  Psychiatric: He has a normal mood and affect. His speech is normal and behavior is normal. Thought content normal.      Assessment & Plan:     1. Type 2 diabetes mellitus with hyperosmolarity without coma, without long-term current use of insulin (HCC) Hgb A1C on 02-06-17 was 9.3% and glucose 218. Still using Metformin 500 mg BID and offered Invokana 100 mg qd. Decided not to take the [place] because girlfriend did not like the side effect profile. May increase  Metformin to 1000 mg BID and continue diabetic diet. Recheck blood levels in 3 months. - metFORMIN (GLUCOPHAGE) 1000 MG tablet; Take 1 tablet (1,000 mg total) by mouth 2 (two) times daily with a meal.  Dispense: 180 tablet; Refill: 3  2. Complex regional pain syndrome type 1 of left lower extremity Still having severe pain that is worse than minor stimulus used to elicit pain. Even a light breeze across the left foot and lower leg creates severe pains. Refuses pain medications or gabapentin/Lyrica. A heating pad over a box around the left foot provides some relief. Refuses to go back to any pain management clinic for fear of getting back on narcotics.       Vernie Murders, PA  [facility]

## 2017-02-22 NOTE — Patient Instructions (Signed)
Complex Regional Pain Syndrome Complex regional pain syndrome (CRPS) is a nerve disorder that causes long-lasting (chronic) pain, usually in a hand, arm, leg, or foot. CRPS usually follows an injury or trauma, such as a fracture or sprain. There are two types of CRPS:  Type 1. This type occurs after an injury or trauma with no known damage to a nerve.  Type 2. This type occurs after injury or trauma damages a nerve.  There are three stages of the condition:  Stage 1. This stage, called the acute stage, may last for three months.  Stage 2. This stage, called the dystrophic stage, may last for three to 12 months.  Stage 3. This stage, called the atrophic stage, may start after one year.  CRPS ranges from mild to severe. For most people CRPS is mild and recovery happens over time. For others, CRPS lasts a very long time and is debilitating. What are the causes? The exact cause of CRPS is not known. What increases the risk? You may be at increased risk if:  You are a woman.  You are approximately 61 years of age.  You have any of the following: ? A family history of CRPS. ? An injury or surgery. ? An infection. ? Cancer. ? Neck problems. ? A stroke. ? A heart attack. ? Asthma.  What are the signs or symptoms? Signs and symptoms in the affected limb are different for each stage. Signs and symptoms of stage 1 include:  Burning pain.  A pins and needles sensation.  Extremely sensitive skin.  Swelling.  Joint stiffness.  Warmth and redness.  Excessive sweating.  Hair and nail growth that is faster than normal.  Signs and symptoms of stage 2 include:  Spreading of pain to the whole limb.  Increased skin sensitivity.  Increased swelling and stiffness.  Coolness of the skin.  Blue discoloration of skin.  Loss of skin wrinkles.  Brittle fingernails.  Signs and symptoms of stage 3 include:  Pain that spreads to other areas of the body but becomes less  severe.  More stiffness, leading to loss of motion.  Skin that is pale, dry, shiny, and tightly stretched.  How is this diagnosed? There is no test to diagnose CRPS. Your health care provider will make a diagnosis based on your signs and symptoms and a physical exam. The exam may include tests to rule out other possible causes of your symptoms. Sometimes imaging tests are done, such as an MRI or bone scan. These tests check for bone changes that might indicate CRPS. How is this treated? Early treatment may prevent CRPS from advancing past stage 1. There is no one treatment that works for everyone. Treatment options may include:  Medicines, such as: ? Nonsteroidal-anti-inflammatory drugs (NSAIDS). ? Steroids. ? Blood pressure drugs. ? Antidepressants. ? Anti-seizure drugs. ? Pain relievers.  Exercise.  Occupational and physical therapy.  Biofeedback.  Mental health counseling.  Numbing injections.  Spinal surgery to implant a spinal cord stimulator or a pain pump.  Follow these instructions at home:  Take medicines only as directed by your health care provider.  Follow an exercise program as directed by your health care provider.  Maintain a healthy weight.  Keep all follow-up visits as directed by your health care provider. This is important. Contact a health care provider if:  Your symptoms change.  Your symptoms get worse.  You develop anxiety or depression. This information is not intended to replace advice given to you by your   health care provider. Make sure you discuss any questions you have with your health care provider. Document Released: 03/02/2002 Document Revised: 08/18/2015 Document Reviewed: 12/07/2013 Elsevier Interactive Patient Education  2018 [employer]. Diabetes Mellitus and Food It is important for you to manage your blood sugar (glucose) level. Your blood glucose level can be greatly affected by what you eat. Eating healthier foods in the  appropriate amounts throughout the day at about the same time each day will help you control your blood glucose level. It can also help slow or prevent worsening of your diabetes mellitus. Healthy eating may even help you improve the level of your blood pressure and reach or maintain a healthy weight. General recommendations for healthful eating and cooking habits include:  Eating meals and snacks regularly. Avoid going long periods of time without eating to lose weight.  Eating a diet that consists mainly of plant-based foods, such as fruits, vegetables, nuts, legumes, and whole grains.  Using low-heat cooking methods, such as baking, instead of high-heat cooking methods, such as deep frying.  Work with your dietitian to make sure you understand how to use the Nutrition Facts information on food labels. How can food affect me? Carbohydrates Carbohydrates affect your blood glucose level more than any other type of food. Your dietitian will help you determine how many carbohydrates to eat at each meal and teach you how to count carbohydrates. Counting carbohydrates is important to keep your blood glucose at a healthy level, especially if you are using insulin or taking certain medicines for diabetes mellitus. Alcohol Alcohol can cause sudden decreases in blood glucose (hypoglycemia), especially if you use insulin or take certain medicines for diabetes mellitus. Hypoglycemia can be a life-threatening condition. Symptoms of hypoglycemia (sleepiness, dizziness, and disorientation) are similar to symptoms of having too much alcohol. If your health care provider has given you approval to drink alcohol, do so in moderation and use the following guidelines:  Women should not have more than one drink per day, and men should not have more than two drinks per day. One drink is equal to: ? 12 oz of beer. ? 5 oz of wine. ? 1 oz of hard liquor.  Do not drink on an empty stomach.  Keep yourself hydrated.  Have water, diet soda, or unsweetened iced tea.  Regular soda, juice, and other mixers might contain a lot of carbohydrates and should be counted.  What foods are not recommended? As you make food choices, it is important to remember that all foods are not the same. Some foods have fewer nutrients per serving than other foods, even though they might have the same number of calories or carbohydrates. It is difficult to get your body what it needs when you eat foods with fewer nutrients. Examples of foods that you should avoid that are high in calories and carbohydrates but low in nutrients include:  Trans fats (most processed foods list trans fats on the Nutrition Facts label).  Regular soda.  Juice.  Candy.  Sweets, such as cake, pie, doughnuts, and cookies.  Fried foods.  What foods can I eat? Eat nutrient-rich foods, which will nourish your body and keep you healthy. The food you should eat also will depend on several factors, including:  The calories you need.  The medicines you take.  Your weight.  Your blood glucose level.  Your blood pressure level.  Your cholesterol level.  You should eat a variety of foods, including:  Protein. ? Lean cuts of meat. ?  Proteins low in saturated fats, such as fish, egg whites, and beans. Avoid processed meats.  Fruits and vegetables. ? Fruits and vegetables that may help control blood glucose levels, such as apples, mangoes, and yams.  Dairy products. ? Choose fat-free or low-fat dairy products, such as milk, yogurt, and cheese.  Grains, bread, pasta, and rice. ? Choose whole grain products, such as multigrain bread, whole oats, and brown rice. These foods may help control blood pressure.  Fats. ? Foods containing healthful fats, such as nuts, avocado, olive oil, canola oil, and fish.  Does everyone with diabetes mellitus have the same meal plan? Because every person with diabetes mellitus is different, there is not one meal  plan that works for everyone. It is very important that you meet with a dietitian who will help you create a meal plan that is just right for you. This information is not intended to replace advice given to you by your health care provider. Make sure you discuss any questions you have with your health care provider. Document Released: 12/07/2004 Document Revised: 08/18/2015 Document Reviewed: 02/06/2013 Elsevier Interactive Patient Education  2017 [employer].

## 2017-03-13 NOTE — Telephone Encounter (Signed)
Visit complete.

## 2017-03-29 ENCOUNTER — Other Ambulatory Visit: Payer: Self-pay

## 2017-03-29 ENCOUNTER — Telehealth: Payer: Self-pay | Admitting: Gastroenterology

## 2017-03-29 DIAGNOSIS — Z1211 Encounter for screening for malignant neoplasm of colon: Secondary | ICD-10-CM

## 2017-03-29 NOTE — Telephone Encounter (Signed)
Gastroenterology Pre-Procedure Review  Request Date:  Requesting Physician: Dr.   PATIENT REVIEW QUESTIONS: The patient responded to the following health history questions as indicated:    1. Are you having any GI issues? no 2. Do you have a personal history of Polyps? no 3. Do you have a family history of Colon Cancer or Polyps? yes (Dad) 4. Diabetes Mellitus? yes (Metformin, Type II) 5. Joint replacements in the past 12 months?no 6. Major health problems in the past 3 months?yes (breathing issues) 7. Any artificial heart valves, MVP, or defibrillator?no    MEDICATIONS & ALLERGIES:    Patient reports the following regarding taking any anticoagulation/antiplatelet therapy:   Plavix, Coumadin, Eliquis, Xarelto, Lovenox, Pradaxa, Brilinta, or Effient? no Aspirin? yes (ASA 81 mg)  Patient confirms/reports the following medications:  Current Outpatient Medications  Medication Sig Dispense Refill   albuterol (PROVENTIL HFA;VENTOLIN HFA) 108 (90 Base) MCG/ACT inhaler Inhale 2 puffs every 6 (six) hours as needed into the lungs for wheezing or shortness of breath. 1 Inhaler 2   azelastine (ASTELIN) 0.1 % nasal spray Place 2 (two) times daily into both nostrils.      budesonide-formoterol (SYMBICORT) 80-4.5 MCG/ACT inhaler Inhale 2 puffs into the lungs 2 (two) times daily. 1 Inhaler 3   canagliflozin (INVOKANA) 100 MG TABS tablet Take 1 tablet (100 mg total) daily before breakfast by mouth. (Patient not taking: Reported on 02/22/2017) 30 tablet 0   fluticasone (FLONASE) 50 MCG/ACT nasal spray Place daily into both nostrils.      glucose blood (ONE TOUCH ULTRA TEST) test strip CHECK FASTING BLOOD SUGAR TWICE DAILY (before breakfast and supper). 200 each 4   lisinopril (PRINIVIL,ZESTRIL) 10 MG tablet TAKE ONE TABLET BY MOUTH ONCE DAILY 90 tablet 3   metFORMIN (GLUCOPHAGE) 1000 MG tablet Take 1 tablet (1,000 mg total) by mouth 2 (two) times daily with a meal. 180 tablet 3   nitroGLYCERIN  (NITROSTAT) 0.4 MG SL tablet Place 1 tablet (0.4 mg total) under the tongue every 5 (five) minutes as needed. 25 tablet 6   pravastatin (PRAVACHOL) 20 MG tablet TAKE ONE TABLET BY MOUTH ONCE DAILY 90 tablet 3   tiotropium (SPIRIVA HANDIHALER) 18 MCG inhalation capsule Place 1 capsule (18 mcg total) into inhaler and inhale daily. 30 capsule 11   No current facility-administered medications for this visit.     Patient confirms/reports the following allergies:  Allergies  Allergen Reactions   Almond (Diagnostic) Swelling    throat   Fentanyl Other (See Comments)    Other reaction(s): Other (See Comments) Jittery Other Reaction: Other reaction   Clove Flavor Other (See Comments)    Loss of vision for 1-2 hrs    No orders of the defined types were placed in this encounter.   AUTHORIZATION INFORMATION Primary Insurance: 1D#: Group #:  Secondary Insurance: 1D#: Group #:  SCHEDULE INFORMATION: Date:  Time: Location:

## 2017-04-01 ENCOUNTER — Encounter: Payer: Self-pay | Admitting: Family Medicine

## 2017-04-02 ENCOUNTER — Other Ambulatory Visit: Payer: Self-pay | Admitting: Family Medicine

## 2017-04-17 ENCOUNTER — Other Ambulatory Visit: Payer: Self-pay

## 2017-04-17 NOTE — Addendum Note (Signed)
Addended by: Kelly Splinter on: 04/17/2017 12:38 PM   Modules accepted: Orders

## 2017-04-17 NOTE — Patient Outreach (Signed)
[facility]) Care Management  04/17/2017  Patrick Grant 1955-10-06 [id number]   Telephone Screen  Referral Date: 04/16/17 Referral Source: MD office Referral Reason: "general transportation med and financial needs" Insurance: Medicare   Outreach attempt # 1  to patient. Spoke with patient and screening call completed.   Social: Patient resides in his home along with a  "roommate." He voices that he is fairly independent but does require assistance with ADLs/IADLs at times. Patient voices multiple falls in the home within the last year. Most recent fall was two weeks ago which he reports he sustained some bruising. Patient voices that he primarily gets around the house by "holding on to the walls." DME in the home include: crutches, walker, cbg meter, CPAP, BP monitor and electric wheelchair. He reports that battery is not working on wheelchair and he does not know how nor can he afford a replacement. He states that he is primarily using crutches to get around.  He has no reliable transportation. At times, his sister or a friend is able to take him to appts but patient reports that they have busy lives and are not always available. He lives in [place] but sees MDs in [place].   Conditions: Per chart review, patient has PMH of COPD, CRPS(complex regional pain syndrome), DM, HLD and HTN. Patient reports some SOB with exertion and talking at times. Patient states that his lung doctor has told him that his "lung capacity is at 40%." He voices ongoing pain mgmt issues related to his condition. He is checking cbgs once a day. He states diabetic med was increased at last PCP visit. Prior to that cbgs had been running in the 400s. Per patient report cbgs running in the 200s. He has had some issues with hypoglycemia as well. Last A1C was 9.3(Nov 2018). Patient not consistently checking and recording BP in the home. RN CM encouraged patient to start doing so especially  since he takes BP meds daily. He voices ongoing issues with dizziness and balance issues. Patient also admits to some issues with depression. Score this call was 14.    Medications: Patient reports he is taking about nine meds.He is unable to afford any of his respiratory meds. He had been getting samples from the MD office but states since he has no transportation to MD he has been unable to get meds. Per referral patient unable to afford Spiriva, Breo, Symbicort and Albuterol and over Extra Help program by less than $100/month." Patient states that he fills med planner weekly by himself. However, he does admit to having times where he has forgotten to take meds.    Appointments: Patient saw PCP on 02/06/17. He has an appointment with  pulmonologist coming up but unsure of date. He also voices he is due for his annual sleep study test next month as well.   Advance Directives: None. Patient interested in completing and would like to delegate his friend-[name] as surrogate decision maker.   Consent: [facility] services reviewed and discussed with patient. Patient gave verbal consent for [facility] services. Per patient request he would not like immediate home visits by [facility] staff at this time. He voices that his dogs outside fence was destroyed and dogs are currently having to stay inside until repairs made and he does not want staff around dogs.    Plan: RN CM will notify [facility] administrative assistant of case status. RN CM will send [facility] SW referral for transportation and financial assistance, advance directives  assistance and score of 14 on depression screening.  RN CM will send [place] referral for possible med assistance and med mgmt.  RN CM will send [facility] community referral for continued support/education on chronic conditions and symptom management and further eval/assessment of care needs.  Enzo Montgomery, RN,BSN,CCM [place] Management Telephonic Care Management Coordinator Direct Phone:  [phone] Toll Free: [phone] Fax: [phone]

## 2017-04-23 ENCOUNTER — Encounter: Payer: Self-pay | Admitting: *Deleted

## 2017-04-23 ENCOUNTER — Other Ambulatory Visit: Payer: Self-pay | Admitting: *Deleted

## 2017-04-23 NOTE — Patient Outreach (Signed)
[place] [facility]) Care Management [place] Telephone Outreach  04/23/2017  Patrick Grant June 30, 1955 [id number]  Unsuccessful telephone outreach to [facility], 63 y/o male referred to [place] by telephonic [facility] RN CM on PCP request.  Patient has history including, but not limited to, COPD, HTN/ HLD, DM- Type II, chronic pain syndrome, frequent falls, and lower extremity reflex sympathetic dystrophy.  HIPAA compliant voice mail message left for patient, requesting return call back.  Plan:  Will re-attempt [place] telephone outreach later this week if I do not hear back from patient first.  Oneta Rack, RN, BSN, [place] Coordinator [facility] Care Management  [phone]

## 2017-04-24 ENCOUNTER — Other Ambulatory Visit: Payer: Self-pay | Admitting: *Deleted

## 2017-04-24 NOTE — Patient Outreach (Signed)
[facility]) Care Management  04/24/2017  Patrick Grant January 09, 1956 [id number]   Patient referred to this social worker by the [facility] telephonic nurse to review transportation resources.   Patient did not answer the phone voicemail message left requesting a return call.   [name] [facility] Care Management [phone]

## 2017-04-26 ENCOUNTER — Telehealth: Payer: Self-pay | Admitting: Pharmacist

## 2017-04-26 ENCOUNTER — Encounter: Payer: Self-pay | Admitting: *Deleted

## 2017-04-26 ENCOUNTER — Other Ambulatory Visit: Payer: Self-pay | Admitting: *Deleted

## 2017-04-26 NOTE — Patient Outreach (Addendum)
[place] [facility]) Care Management [place] Telephone Outreach 04/26/2017  Patrick Grant Aug 05, 1955 [id number]  Successful telephone outreach to [facility], 62 y/o male referred to [place] by telephonic [facility] RN CM on PCP request.  Patient has history including, but not limited to, COPD, HTN/ HLD, DM- Type II, chronic pain syndrome, frequent falls, and lower extremity reflex sympathetic dystrophy.  HIPAA/ identity verified with patient during phone call today.  [facility] Community CM services/ role discussed with patient and patient provides verbal consent for [facility] CM involvement in his care.  Patient again states that he does not wish to have [facility] CM staff visit him at home, again explaining that he has "4 unfriendly dogs," and no fenced in yard; patient confirms that his dogs are a safety risk and "may bite anyone that comes over to visit."  Explained that I would put [facility] CM packet in the mail to him next week along with [facility] CM written consent for him to return in self-addressed stamped envelope, and patient requests that I add his room mate Patrick Grant to the consent prior to mailing to him, and he agrees to promptly return consent once he receives it.  Today, patient reports he is "hanging in there," and states that he "is about the same" as he "always is."  Patient reports generalized pain, "mainly in my (L) leg into my foot," which he states is a result of his "nerve and muscle disease," "Reflex sympathetic dystrophy," which he reports he has had "for about 7 years."  Patient rates his pain at "6/10" and states that he uses warmth/ heat and rest to treat/ manage pain, stating he is "scared to take" any medicines for pain relief based on past "bad reactions" he has had from using pain medication.  Patient sounds to be in no obvious/ apparent distress throughout today's 70 minute phone call.    Patient reports that he "is doing the same as always," and states that he "does  not know" how [place] RN CM can assist him in self-health management of his chronic conditions.  Patient verbalizes that he believes his "biggest health issue" is currently COPD, and he describes that all he does "is lay around watching TV or messing with his I-pad," because of pain, ongoing dizzy feelings, and difficulty with breathing" does not allow him to "do much else."  Patient stated that there have been several days over the last few weeks where he has "decided not to wear my CPAP at night," and stated that on the mornings he awakes after NOT using CPAP, he "feels better and is not dizzy."  States he is not sure that the use of CPAP "is causing more problems than it is helping."  Patient can not currently quantify the number of times he is not using his CPAP, stating that he "just decides" before going to bed whether or not he will wear, or not wear, his CPAP.  Patient further reports:  Medications: -- Has all medicationsand takes as prescribed;denies questions about current medications.  -- verbalizes a good general understanding of the purpose, dosing, and scheduling of his medications -- self-manage medications without assistance -- denies issues with swallowing medications -- all medications were thoroughly reviewed with patient today -- again endorses that he could use assistance with affording/ obtaining his medications, and I re-iterated to patient that [facility] pharmacist referral had been placed, and that the pharmacist would be contacting him soon to discuss his concerns around  his medications; encouraged patient to actively listen for [place] phone call and to return the pharmacists call if he should receive a voice mail message, which he agreed to do  Provider appointments: -- All upcoming provider appointments were reviewed with patient today -- pulmonary appointment scheduled for May 13, 2017; verbalizes plans to attend stating that either one of his sisters or room  mate "Patrick Grant" will transport him  Safety/ Mobility/ Falls: -- admits multiple falls, with most recent being "a couple of weeks ago."  Denies "serious injury" with any recent falls, states has not had to go to hospital for falls -- assistive devices: has motorized wheelchair, "but battery is bad, it's not currently working;" states he usually uses crutches for ambulation, due to his ongoing frequent dizziness and LE pain -- general fall risks/ prevention education discussed with patient today; encouraged patient to continue using assistive devices and to move slowly and to sit/ lie down when he is dizzy -- reports "doesn't see well at all."  Discussed need to schedule prompt eye doctor appointment, as he reports he has not been to see eye doctor "in about 5 years;" discussed need for good vision around self-health management of DM, as well as fall prevention  Social/ [employer] needs: -- endorses community resource needs, "possibly for transportation," stating supportive family members that assist with care needs as indicated, but are limited in availability to assist around their own family responsibilities -- family, or room mate Patrick Grant currently provide transportation for patient to all provider appointments, errands, etc -- explained that [facility] CSW referral had been placed and that CSW would be contacting him soon to discuss community resource needs; encouraged patient to actively listen for [facility] CSW phone call and to return the call promptly if he should receive a voice mail message, which he agreed to do; patient stated that he attempted to contact CSW after receiving a voice mail from her this week, and said that he would continue listening out for return call.  Self-health management of chronic disease states of COPD, DM, and chronic pain: -- patient clearly stated that he is NOT interested in pain management referral/ taking medications for pain due to "previous bad experiences" around same;  states today that he has had pain for so long, he "is used to it." -- monitors and records daily fasting blood glucose levels every morning; reports general range of fasting blood sugars 200-300, with fasting blood sugar this morning of "286."  Patient unsure what is last A1-C is; verified through review of EMR last documented A1-C of 9.3 (02/06/17) -- patient reports "easily short of breath with any activity," stating that he uses his rescue inhaler "about 3-6 times per week." -- denies use of home O2, nebulizer -- as above, reports "has CPAP, but thinks it makes" him dizzy in the morning; reports he has independently tried leaving CPAP off at night, and when he does so, he "feels better in the morning, and has no dizziness."  Patient states that he "uses it about half of the time, and half of the time I don't."  Denies that he has discussed this self-directed use of CPAP with care providers, and I encouraged him to do so promptly, explaining that his care team needs to know his current adherence to overall plan of care.  Patient stated that he will discuss with pulmonary provider during upcoming office visit, and that he is considering reaching out to his PCP via my-chart to inform him, which I  encouraged patient to do.  I also discussed with him need to quantify his belief that CPAP makes him have increased morning dizziness with it's prescribed use; discussed need to monitor and record his dizziness around whether he choses to use CPAP or not, and patient verbalizes understanding and agreement with this general plan. -- briefly discussed episodes of shortness of breath, which he reports happens "several times" every week, "usually around periods of activity like walking around the house;" patient states that his current action plan for management is to stop, sit down, and rest, and if necessary "use rescue inhaler."  Discussed signs/ symptoms to report immediately to care providers, and patient verbalizes  good understanding of same.  Patient denies further issues, concerns, or problems today.  I provided/ confirmed that patient has my direct phone number, the main [facility] CM office phone number, and the [facility] CM 24-hour nurse advice phone number should issues arise prior to next scheduled [place] outreach, with scheduled telephone call post- completion of next scheduled pulmonary provider office visit.   Plan:  Patient will take medications as prescribed and will attend all scheduled provider appointments  Patient will promptly notify his care providers for any new concerns, issues, or problems that arise  Patient will actively engage/ communicate with [facility] Pharmacist and CSW for stated medication and community resource needs  Patient will monitor and record his use of CPAP and dizzy spells, and will share his data with pulmonary provider at upcoming office visit  I will make patient's PCP aware of [facility] involvement in patient's care and patient's current self-directed use of CPAP  [facility] CM involvement in patient's care to continue with scheduled telephone outreach post-pulmonary provider office visit   [facility] CM Care Plan Problem One     Most Recent Value  Care Plan Problem One  Need for self-health management of chronic disease state of COPD, as evidenced by patient reporting   Role Documenting the Problem One  Care Management Coordinator  Care Plan for Problem One  Active  [facility] Long Term Goal   Over the next 60 days, patient will be able to verbalize 3 strategies for self-health management of chronic disease state of COPD, as evidenced by patient reporting of same during [place] outreach  [place] Term Goal Start Date  04/26/17  Interventions for Problem One Long Term Goal  Discussed with patient his current understanding of COPD,  use of CPAP at home,  challenges with breathing around other chronic conditions,  introduced and discussed concept of self-health management action  planning for COPD  [facility] CM Short Term Goal #1   Over the next 18 days, patient will monitor and record frequency of morning dizzy episodes around his current use of CPAP, and will take this recorded log to next upcoming pulmonary provider office visit on 05/13/17, as evidenced by patient reporting and review of EMR provider office notes during [facility] RN CCM outreach  [facility] CM Short Term Goal #1 Start Date  04/26/17  Interventions for Short Term Goal #1  Discussed with patient his current/ established use of CPAP and his belief that CPAP use makes him more dizzy in the mornings,  discussed value of monitoring and recording dizzy spells around use of CPAP for purposes of quantification to share with pulmonary provider,  encouraged patient to begin monitoring and recording morning dizzy spells and to take this information to provider with him for review     I appreciate the opportunity to  participate in Doug's care,  Oneta Rack, RN, BSN, [employer] Coordinator [facility] Care Management  [phone]

## 2017-04-26 NOTE — Patient Outreach (Signed)
[facility]) Care Management  04/26/2017  DRAYK HUMBARGER 1955/05/24 [id number]  62 y.o. year old male referred to [place] for Medication Assistance (Pharmacy telephone outreach)  PMH s/f: HTN, T2DM, complex regional pain syndrome of lower limb, hyperlipidemia, COPD (per patient report)  Was unable to reach patient via telephone today and no voicemail available (unsuccessful outreach #1).  Plan: Will followup in 2 business days via telephone   [name], [place].D., BCPS PGY2 Ambulatory Care Pharmacy Resident Phone: [phone]

## 2017-04-30 ENCOUNTER — Other Ambulatory Visit: Payer: Self-pay | Admitting: Pharmacist

## 2017-04-30 ENCOUNTER — Other Ambulatory Visit: Payer: Self-pay | Admitting: *Deleted

## 2017-04-30 ENCOUNTER — Telehealth: Payer: Self-pay | Admitting: Pharmacist

## 2017-04-30 NOTE — Patient Outreach (Signed)
[place] [facility]) Care Management  04/30/2017  MOKSH LOOMER January 29, 1956 [id number]    Phone call to patient to address community resource needs. Per patient, his 2 sisters and girlfriend [name] usually provides the transportation to his doctor's appointments.Per patient,he resides with his girlfriend who takes care of most of the household needs. Patient discussed having multiple medical bills, however states that he believes that [name] has made payment arrangements. This Education officer, museum suggested that he contact the business office for the hospital to confirm payment arrangements or the possibility of applying for hardship.  made or   It is very rare that he misses an appointment due to lack of transportation. Patient admits to depression related to his multiple medical conditions, however denies thoughts of self harm or desire to participate in mental health therapy. Per patient, he does not get around a lot due to his medical issues, however he has his 4 dogs and 3 birds that he enjoys taking care of. "If I did not have them, I don't know what I would". Per patient, he stays in a lot and usually watches TV, or his IPAD. Supportive counseling provided to patient.   Plan: Advanced Directive Packet to be mailed to patient's home. This Education officer, museum to follow up to ensure packet is received and to answer any questions within 2 weeks.    [name] [facility] Care Management [phone]

## 2017-04-30 NOTE — Patient Outreach (Signed)
[place] [facility]) Care Management  [place]   04/30/2017  PARRY PO 09-Mar-1956 [id number]  62 y.o. year old male referred to [place] for Medication Assistance (Pharmacy telephone outreach #2)  PMH s/f: HTN, T2DM, complex regional pain syndrome of lower limb, hyperlipidemia, COPD/emphysema (per patient report).   Patient with [facility] medicare advantage plan.    Patient confirms identity with HIPAA-identifiers x2 and gives verbal consent to speak over the phone about medications.   Subjective: Patient reports he has prescription drug coverage through [facility] but states that medications are too expensive while he is still working towards his deductible. He reports that Symbicort, Spiriva, and Invokana are all unaffordable at this time. Reports he has not previously qualified for Extra Help through Medicare. He states that his doctor's office has been working on patient assistance applications for his inhalers but there have been some issues in the process. He reports that they need him to come back to the office and pick up the application but he is unable to get to the office as he relies on others for transportation. Patient reports some reservation around taking Invokana as he read it causes cancer, however after discussion of risks of adverse effects, he states he is willing to try it. He is currently NOT on any inhalers besides his PRN albuterol. Also not taking Invokana.   Called Vernie Murders, PA-C's office and spoke with [place].  Per Antony Madura - Pt assistance form for Spiriva was filled out, but they needed financial documentation. There is a scanned copy of this form in the "media tab" of the electronic medical record.   Objective:  Spiriva, invokana, and Symbicort are all on patient's formulary per my search.   Encounter Medications: Outpatient Encounter Medications as of 04/30/2017  Medication Sig Note  . albuterol (PROVENTIL HFA;VENTOLIN HFA) 108 (90 Base)  MCG/ACT inhaler Inhale 2 puffs every 6 (six) hours as needed into the lungs for wheezing or shortness of breath. 04/26/2017: Has been using "several times a week"; reports last used "a couple of days ago"  . azelastine (ASTELIN) 0.1 % nasal spray Place 2 (two) times daily into both nostrils.    . fluticasone (FLONASE) 50 MCG/ACT nasal spray Place daily into both nostrils.    Marland Kitchen glucose blood (ONE TOUCH ULTRA TEST) test strip CHECK FASTING BLOOD SUGAR TWICE DAILY (before breakfast and supper).   Marland Kitchen lisinopril (PRINIVIL,ZESTRIL) 10 MG tablet TAKE ONE TABLET BY MOUTH ONCE DAILY   . metFORMIN (GLUCOPHAGE) 1000 MG tablet Take 1 tablet (1,000 mg total) by mouth 2 (two) times daily with a meal.   . nitroGLYCERIN (NITROSTAT) 0.4 MG SL tablet Place 1 tablet (0.4 mg total) under the tongue every 5 (five) minutes as needed. 04/26/2017: Reports has not needed recently  . pravastatin (PRAVACHOL) 20 MG tablet TAKE ONE TABLET BY MOUTH ONCE DAILY   . tiotropium (SPIRIVA HANDIHALER) 18 MCG inhalation capsule Place 1 capsule (18 mcg total) into inhaler and inhale daily.   . budesonide-formoterol (SYMBICORT) 80-4.5 MCG/ACT inhaler Inhale 2 puffs into the lungs 2 (two) times daily. (Patient not taking: Reported on 04/30/2017) 04/26/2017: Reports using 1 puff BID  . canagliflozin (INVOKANA) 100 MG TABS tablet Take 1 tablet (100 mg total) daily before breakfast by mouth. (Patient not taking: Reported on 04/30/2017)    No facility-administered encounter medications on file as of 04/30/2017.     Assessment: #Medication Assistance - 62 year old patient is not in donut hole and is interested in applying  for medication assistance through manufacturer programs for the following medications: Symbicort, Spiriva, Invokana - Made f/u appointment to go to patient's home and doctor's office to collect the below information:   Below still required to complete applications: PATIENT:  [ ]  Completed and signed application [ ]  Financial  information [ ]  Insurance card information   PROVIDER::  Rx -  [ ]  Symbicort; [ ]  Spiriva, [ ]  Invokana Signed application - [ ]  Symbicort; [ ]  Spiriva, [ ]  Invokana   Follow up appointment: 2/12 via home visit  [name], Pharm.D., BCPS PGY2 Ambulatory Care Pharmacy Resident Phone: [phone]

## 2017-05-01 NOTE — Patient Outreach (Signed)
Request received from [facility], LCSW          to mail patient personal care resources.  Information mailed today.

## 2017-05-07 ENCOUNTER — Telehealth: Payer: Self-pay | Admitting: Pharmacist

## 2017-05-07 ENCOUNTER — Ambulatory Visit: Payer: Self-pay | Admitting: Pharmacist

## 2017-05-07 NOTE — Patient Outreach (Signed)
[place] [facility]) Care Management  05/07/2017  BERNICE MULLIN [birth year] [id number]   62 y.o. year old male referred to [place] for Medication Assistance (Pharmacy home visit ) and Care Coordination  PMH s/f: HTN, T2DM, complex regional pain syndrome of lower limb, hyperlipidemia, COPD/emphysema (per patient report).   Patient with [facility] medicare advantage plan.   Went to patient's home as scheduled to obtain signatures, patient information, and financial documentation for patient assistance programs for inhalers and SGLT2 inhibitor. No answer at door after several minutes. Called patient and no answer, left HIPAA-compliant VM both before home visit and while in patient's driveway.   Went to patient's doctor's office to drop off patient assistance applications.   1. SGLT2 inhibitor - patient is on Invokana 300 mg daily presently. Patient assistance via [place] requires patient to have spent 4% of income on prescription drug costs year-to-date. Instead, recommend that patient is switched to Jardiance 25 mg daily. Renal function appropriate per 02/06/2017 labs. This medication can be obtained from [employer] patient assistance foundation. Dropped off this application at [facility], PA-C office to sign if clinically appropriate.   2. LABA/ICS - patient is on Symbicort presently. Patient assistance via [employer]  requires patient to have spent 3% of income on prescription drug costs year-to-date. Instead, recommend that patient is switched to [facility] 100/5 mg. This medication can be obtained from [employer] patient assistance foundation. Patient is on low-dose steroid via current therapy. Conversion to Symbicort would either be 100/5 mg - 1 puff twice daily (low dose steroid, but incomplete dose of LABA component) or 100/5 mg - 2 puffs twice daily (moderate dose steroid, complete dose of LABA component). Unfortunately, other ICS/LABAs including Breo,  Advair, and Trelegy are all available through [place] and patient would be required to spend $600 out of pocket year-to-date to qualify. Dropped off this application at [facility], PA-C office to complete and sign if clinically appropriate.   3. LAMA - patient is on Spiriva Handihaler presently. This medication can be obtained from [employer] patient assistance foundation. Dropped off this application at [employer], PA-C office to sign.    Left materials with front desk staff, Arbie Cookey, as Antony Madura was out of the office. She states she will give to [employer], PA-C.  Will follow up with his office on Friday to determine when I can return to collect application materials.  Will follow up with patient within 3 business days.   [name], Pharm.D., BCPS PGY2 Ambulatory Care Pharmacy Resident Phone: [phone]

## 2017-05-09 ENCOUNTER — Other Ambulatory Visit: Payer: Self-pay | Admitting: Family Medicine

## 2017-05-09 MED ORDER — TIOTROPIUM BROMIDE MONOHYDRATE 18 MCG IN CAPS: 18.0000 ug | ORAL_CAPSULE | Freq: Every day | RESPIRATORY_TRACT | 3 refills | Status: DC

## 2017-05-09 MED ORDER — MOMETASONE FURO-FORMOTEROL FUM 100-5 MCG/ACT IN AERO: 2.0000 | INHALATION_SPRAY | Freq: Two times a day (BID) | RESPIRATORY_TRACT | 3 refills | Status: DC

## 2017-05-09 MED ORDER — EMPAGLIFLOZIN 25 MG PO TABS: 25.0000 mg | ORAL_TABLET | Freq: Every day | ORAL | 3 refills | Status: DC

## 2017-05-10 ENCOUNTER — Ambulatory Visit: Payer: Self-pay | Admitting: *Deleted

## 2017-05-10 ENCOUNTER — Other Ambulatory Visit: Payer: Self-pay | Admitting: *Deleted

## 2017-05-10 ENCOUNTER — Ambulatory Visit: Payer: Self-pay | Admitting: Pharmacist

## 2017-05-10 ENCOUNTER — Telehealth: Payer: Self-pay | Admitting: Gastroenterology

## 2017-05-10 ENCOUNTER — Other Ambulatory Visit: Payer: Self-pay | Admitting: Pharmacist

## 2017-05-10 ENCOUNTER — Encounter: Payer: Self-pay | Admitting: *Deleted

## 2017-05-10 NOTE — Patient Outreach (Signed)
[facility]) Care Management  05/10/2017  Patrick Grant 01/30/1956 [id number]   62 y.o. year old male referred to [place] for Medication Assistance (Pharmacy follow up)  PMH s/f:HTN, T2DM, complex regional pain syndrome of lower limb, hyperlipidemia, COPD/emphysema(per patient report).  Patient with[employer] advantage plan.   Met patient at his doctor's office to obtain patient assistance application materials. Patient provides financial documents and Medicare insurance card. Does not have a copy of his [employer] card. States he never got a copy of this. Requests pill box for adherence.   Patient states he has NOT spent $600 or 3% of income on medication costs yet this year. States he is OK with planned switches from invokana>jardiance, symbicort >dulera.   Per Vernie Murders, PA-C - approved switches above. Changed Dulera to 2 puffs BID understanding this is higher intensity inhaled steroid than patient was previously on.   A/P #Medication Assistance - patient is not in donut hole and is interested in applying for medication assistance through manufacturer programs for the following medications: Jardiance, Spiriva; [name] -Mailed completed application for [name] to [facility] patient assistance foundation 05/10/2017 -Faxed completed application for Jardiance and Spiriva.   Will follow up next week.   [name], Pharm.D., BCPS PGY2 Ambulatory Care Pharmacy Resident Phone: [phone]

## 2017-05-10 NOTE — Telephone Encounter (Signed)
This encounter was created in error - please disregard.

## 2017-05-10 NOTE — Patient Outreach (Signed)
[place] [facility]) Care Management  05/10/2017  Patrick Grant Apr 29, 1955 [id number]   Return phone call from patient who states that he did not receive the Advanced Directive documents mailed to his home. Arrangements made to meet this social worker on 05/13/17 at 10:00am at patient's Pulmonologist office.  Advanced Directive to be reviewed and completed at that time.   [name] [facility] Care Management [phone]

## 2017-05-10 NOTE — Patient Outreach (Signed)
[place] [facility]) Care Management  05/10/2017  Patrick Grant 11/04/1955 [id number]   Phone call to patient to ensure that he received the Advanced Directive material mailed to his home. There was no answer. HIPPA compliant voicemail message left for a return call.   [name] [facility] Care Management [phone]

## 2017-05-10 NOTE — Telephone Encounter (Signed)
Patient want to cancel his procedure on Fri 05/17/17 w/ Dr. Vicente Males. He will call back later to r/s.

## 2017-05-10 NOTE — Telephone Encounter (Signed)
Patients colonoscopy has been canceled for 05/17/17.  Reason not provided, however stated that he would reschedule.

## 2017-05-13 ENCOUNTER — Encounter: Payer: Self-pay | Admitting: Internal Medicine

## 2017-05-13 ENCOUNTER — Ambulatory Visit: Payer: Medicare Other | Admitting: Internal Medicine

## 2017-05-13 ENCOUNTER — Ambulatory Visit: Payer: Self-pay | Admitting: *Deleted

## 2017-05-13 ENCOUNTER — Other Ambulatory Visit: Payer: Self-pay | Admitting: *Deleted

## 2017-05-13 VITALS — BP 138/80 | HR 95 | Resp 16 | Ht 73.0 in | Wt 226.2 lb

## 2017-05-13 DIAGNOSIS — Z9989 Dependence on other enabling machines and devices: Secondary | ICD-10-CM | POA: Diagnosis not present

## 2017-05-13 DIAGNOSIS — G4733 Obstructive sleep apnea (adult) (pediatric): Secondary | ICD-10-CM

## 2017-05-13 DIAGNOSIS — J069 Acute upper respiratory infection, unspecified: Secondary | ICD-10-CM

## 2017-05-13 DIAGNOSIS — J449 Chronic obstructive pulmonary disease, unspecified: Secondary | ICD-10-CM | POA: Diagnosis not present

## 2017-05-13 DIAGNOSIS — J439 Emphysema, unspecified: Secondary | ICD-10-CM

## 2017-05-13 NOTE — Progress Notes (Signed)
[facility], [place] [address]  Pulmonary Sleep Medicine  Office Visit Note  Patient Name: Patrick Grant DOB: 1955/10/26 MRN [id number]  Date of Service: 05/13/2017  Complaints/HPI: He states that he has been slwoly getting over a cold. He states that he has some cough noted. He states that he has had sick contacts from hi shome. NO fever at this time noted. He states that he has a cough is improving now. Patient states it was worse now better. He staets he is not smoking. Denies chills at this time  ROS  General: (-) fever, (-) chills, (-) night sweats, (-) weakness Skin: (-) rashes, (-) itching,. Eyes: (-) visual changes, (-) redness, (-) itching. Nose and Sinuses: (-) nasal stuffiness or itchiness, (-) postnasal drip, (-) nosebleeds, (-) sinus trouble. Mouth and Throat: (-) sore throat, (-) hoarseness. Neck: (-) swollen glands, (-) enlarged thyroid, (-) neck pain. Respiratory: + cough, (-) bloody sputum, + shortness of breath, + wheezing. Cardiovascular: - ankle swelling, (-) chest pain. Lymphatic: (-) lymph node enlargement. Neurologic: (-) numbness, (-) tingling. Psychiatric: (-) anxiety, (-) depression   Current Medication: Outpatient Encounter Medications as of 05/13/2017  Medication Sig Note  . albuterol (PROVENTIL HFA;VENTOLIN HFA) 108 (90 Base) MCG/ACT inhaler Inhale 2 puffs every 6 (six) hours as needed into the lungs for wheezing or shortness of breath. 04/26/2017: Has been using "several times a week"; reports last used "a couple of days ago"  . azelastine (ASTELIN) 0.1 % nasal spray Place 2 (two) times daily into both nostrils.    . empagliflozin (JARDIANCE) 25 MG TABS tablet Take 25 mg by mouth daily.   . fluticasone (FLONASE) 50 MCG/ACT nasal spray Place daily into both nostrils.    Marland Kitchen glucose blood (ONE TOUCH ULTRA TEST) test strip CHECK FASTING BLOOD SUGAR TWICE DAILY (before breakfast and supper).   Marland Kitchen lisinopril  (PRINIVIL,ZESTRIL) 10 MG tablet TAKE ONE TABLET BY MOUTH ONCE DAILY   . metFORMIN (GLUCOPHAGE) 1000 MG tablet Take 1 tablet (1,000 mg total) by mouth 2 (two) times daily with a meal.   . nitroGLYCERIN (NITROSTAT) 0.4 MG SL tablet Place 1 tablet (0.4 mg total) under the tongue every 5 (five) minutes as needed. 04/26/2017: Reports has not needed recently  . pravastatin (PRAVACHOL) 20 MG tablet TAKE ONE TABLET BY MOUTH ONCE DAILY   . tiotropium (SPIRIVA HANDIHALER) 18 MCG inhalation capsule Place 1 capsule (18 mcg total) into inhaler and inhale daily.   . mometasone-formoterol (DULERA) 100-5 MCG/ACT AERO Inhale 2 puffs into the lungs 2 (two) times daily at 10 AM and 5 PM. (Patient not taking: Reported on 05/13/2017)    No facility-administered encounter medications on file as of 05/13/2017.     Surgical History: Past Surgical History:  Procedure Laterality Date  . SPINAL CORD STIMULATOR REMOVAL  2010   DUE TO LOSS OF MUSCLE USE WHEN ACTIVATED    Medical History: Past Medical History:  Diagnosis Date  . COPD (chronic obstructive pulmonary disease) ([place])   . CRPS (complex regional pain syndrome)   . Diabetes mellitus without complication ([place])   . Hyperlipidemia   . Hypertension     Family History: Family History  Problem Relation Age of Onset  . CVA Mother   . Kidney failure Mother   . Diabetes Mother   . CVA Father   . Kidney failure Father   . Colon cancer Father   . Ovarian cancer Sister   . Diabetes Brother   .  Healthy Sister     Social History: Social History   Socioeconomic History  . Marital status: Single    Spouse name: Not on file  . Number of children: Not on file  . Years of education: Not on file  . Highest education level: Not on file  Social Needs  . Financial resource strain: Not on file  . Food insecurity - worry: Not on file  . Food insecurity - inability: Not on file  . Transportation needs - medical: Not on file  . Transportation needs - non-medical:  Not on file  Occupational History  . Not on file  Tobacco Use  . Smoking status: Former Smoker    Years: 25.00    Types: Cigars    Last attempt to quit: 03/12/2016    Years since quitting: 1.1  . Smokeless tobacco: Never Used  Substance and Sexual Activity  . Alcohol use: No    Alcohol/week: 0.0 oz  . Drug use: No  . Sexual activity: Not on file  Other Topics Concern  . Not on file  Social History Narrative  . Not on file    Vital Signs: Blood pressure 138/80, pulse 95, resp. rate 16, height 6\' 1"  (1.854 m), weight 226 lb 3.2 oz (102.6 kg), SpO2 97 %.  Examination: General Appearance: The patient is well-developed, well-nourished, and in no distress. Skin: Gross inspection of skin unremarkable. Head: normocephalic, no gross deformities. Eyes: no gross deformities noted. ENT: ears appear grossly normal no exudates. Neck: Supple. No thyromegaly. No LAD. Respiratory: clear no rhonchi. Cardiovascular: Normal S1 and S2 without murmur or rub. Extremities: No cyanosis. pulses are equal. Neurologic: Alert and oriented. No involuntary movements.  LABS: No results found for this or any previous visit (from the past 2160 hour(s)).  Radiology: Nm Myocar Multi W/spect W/wall Motion / Ef  Result Date: 05/02/2016  Low risk study without evidence of ischemia.  There is a small in size, mild in severity, fixed defect involving the apex and apical anterior segments consistent with apical thinning and attenuation artifact. Scar is felt less likely.  The left ventricular ejection fraction is hyperdynamic (>65%).     No results found.  No results found.    Assessment and Plan: Patient Active Problem List   Diagnosis Date Noted  . Scrotal pain, Right 04/19/2016  . Prostate cancer screening 04/19/2016  . Cervical pain 02/08/2015  . Cervical nerve root disorder 02/08/2015  . CRPS (complex regional pain syndrome), lower limb 02/08/2015  . Bleeding from the nose 02/08/2015  .  Reflex sympathetic dystrophy of lower extremity 02/08/2015  . Type 2 diabetes mellitus ([place]) 02/08/2015  . Combined fat and carbohydrate induced hyperlipemia 04/06/2008  . Elevated fasting blood sugar 10/10/2007  . Essential (primary) hypertension 09/30/2007    1. OSA he is doing well withhis CPAP device. Has had 100% compliance noted. He states has not had issues until this recent cold symptoms 2. URI unlikely to benefit from tamiflu now since he is beyond the infectious window and is actually clinically improving 3. COPD will continue with his current inhaler regimen  General Counseling: I have discussed the findings of the evaluation and examination with [facility].  I have also discussed any further diagnostic evaluation thatmay be needed or ordered today. Othell verbalizes understanding of the findings of todays visit. We also reviewed his medications today and discussed drug interactions and side effects including but not limited excessive drowsiness and altered mental states. We also discussed that there is always  a risk not just to him but also people around him. he has been encouraged to call the office with any questions or concerns that should arise related to todays visit.    Time spent: 63min  I have personally obtained a history, examined the patient, evaluated laboratory and imaging results, formulated the assessment and plan and placed orders.    Allyne Gee, MD [facility] Pulmonary and Critical Care Sleep medicine

## 2017-05-13 NOTE — Patient Outreach (Signed)
[place] [facility]) Care Management  [facility] Social Work  05/13/2017  HARSHAL SIRMON 1955-05-16 [id number]  Subjective:  Patient is  A 62 year old male referred to this social worker for assistance with competing his advanced directive. This Education officer, museum met patient at his appointment with the Pulmonologist to complete Advanced Directive.  Advanced Directive reviewed and completed with patient.  It was explained that the document will need to be notarized.  Objective:   Encounter Medications:  Outpatient Encounter Medications as of 05/13/2017  Medication Sig Note  . albuterol (PROVENTIL HFA;VENTOLIN HFA) 108 (90 Base) MCG/ACT inhaler Inhale 2 puffs every 6 (six) hours as needed into the lungs for wheezing or shortness of breath. 04/26/2017: Has been using "several times a week"; reports last used "a couple of days ago"  . azelastine (ASTELIN) 0.1 % nasal spray Place 2 (two) times daily into both nostrils.    . empagliflozin (JARDIANCE) 25 MG TABS tablet Take 25 mg by mouth daily.   . fluticasone (FLONASE) 50 MCG/ACT nasal spray Place daily into both nostrils.    Marland Kitchen glucose blood (ONE TOUCH ULTRA TEST) test strip CHECK FASTING BLOOD SUGAR TWICE DAILY (before breakfast and supper).   Marland Kitchen lisinopril (PRINIVIL,ZESTRIL) 10 MG tablet TAKE ONE TABLET BY MOUTH ONCE DAILY   . metFORMIN (GLUCOPHAGE) 1000 MG tablet Take 1 tablet (1,000 mg total) by mouth 2 (two) times daily with a meal.   . mometasone-formoterol (DULERA) 100-5 MCG/ACT AERO Inhale 2 puffs into the lungs 2 (two) times daily at 10 AM and 5 PM.   . nitroGLYCERIN (NITROSTAT) 0.4 MG SL tablet Place 1 tablet (0.4 mg total) under the tongue every 5 (five) minutes as needed. 04/26/2017: Reports has not needed recently  . pravastatin (PRAVACHOL) 20 MG tablet TAKE ONE TABLET BY MOUTH ONCE DAILY   . tiotropium (SPIRIVA HANDIHALER) 18 MCG inhalation capsule Place 1 capsule (18 mcg total) into inhaler and inhale daily.    No  facility-administered encounter medications on file as of 05/13/2017.     Functional Status:  In your present state of health, do you have any difficulty performing the following activities: 04/26/2017 02/06/2017  Hearing? N N  Vision? Y Y  Comment - loss of sight in left eye  Difficulty concentrating or making decisions? N Y  Walking or climbing stairs? Y Y  Comment - due to knee pain  Dressing or bathing? N Y  Comment - friend assists with this  Doing errands, shopping? Y Y  Comment - does not Physiological scientist and eating ? N Y  Using the Toilet? N N  In the past six months, have you accidently leaked urine? N N  Do you have problems with loss of bowel control? N N  Managing your Medications? N N  Managing your Finances? N N  Housekeeping or managing your Housekeeping? Y Y  Comment States Roommate assists -  Some recent data might be hidden    Fall/Depression Screening:  PHQ 2/9 Scores 04/17/2017 02/06/2017 05/23/2016 01/31/2016  PHQ - 2 Score 5 6 0 4  PHQ- 9 Score 14 19 - 12    Assessment: met with patient at pulomonologist. Advanced Directive reviewed and completed, [facility] consent signed. This social worker S\spoke with Management consultant at patient's primary care's office,  [employer] who is a Patent examiner and agrees to [employer] on patient's next appointment on 05/24/17 at 3:30 pm   Plan: Patient has verbalized having no additional community resource  needs at this time. Patient roommate to provide transportation to medical appointments. Patient declined resources for mental health counseling. Advanced Directive and Living Will completed to be notarized during his next providers office visit on May 24, 2017. Patient to be closed to social work at this time,    [name] [facility] Care Management [phone]

## 2017-05-14 ENCOUNTER — Other Ambulatory Visit: Payer: Self-pay | Admitting: *Deleted

## 2017-05-14 ENCOUNTER — Encounter: Payer: Self-pay | Admitting: *Deleted

## 2017-05-14 NOTE — Patient Outreach (Signed)
[place] [facility]) Care Management [place] Telephone Outreach  05/14/2017  DONNEY CARAVEO 10-Nov-1955 [id number]  Unsuccessful telephone outreach to [facility], 62 y/o male referred to [place] by telephonic [facility] RN CM on PCP request.Patient has history including, but not limited to, COPD, HTN/ HLD, DM- Type II, chronic pain syndrome, frequent falls, and lower extremity reflex sympathetic dystrophy.    HIPAA compliant voice mail message left for patient, requesting return call back.  Plan:  Will re-attempt [place] telephone outreach again tomorrow if I do not hear back from patient first.  Oneta Rack, RN, BSN, [place] Coordinator [facility] Care Management  [phone]

## 2017-05-15 ENCOUNTER — Other Ambulatory Visit: Payer: Self-pay | Admitting: *Deleted

## 2017-05-15 ENCOUNTER — Encounter: Payer: Self-pay | Admitting: *Deleted

## 2017-05-15 NOTE — Patient Outreach (Signed)
[place] [facility]) Care Management [place] Telephone Outreach  05/15/2017  JERRI HARGADON 03-14-1956 [id number]  Unsuccessful telephone outreach to [facility], 62 y/o male referred to [place] by telephonic [facility] RN CM on PCP request.Patient has history including, but not limited to, COPD, HTN/ HLD, DM- Type II, chronic pain syndrome, frequent falls, and lower extremity reflex sympathetic dystrophy.  Today, I was unable to leave HIPAA compliant voice mail message for patient requesting return call back, as patient's phone rang without physical or voicemail pick up .  Plan:  Will re-attempt [place] telephone outreach again tomorrow since I was unable to leave patient voicemail message requesting call back today.  Oneta Rack, RN, BSN, [employer] [facility] Care Management  [phone]

## 2017-05-16 ENCOUNTER — Other Ambulatory Visit: Payer: Self-pay | Admitting: *Deleted

## 2017-05-16 ENCOUNTER — Encounter: Payer: Self-pay | Admitting: *Deleted

## 2017-05-16 NOTE — Patient Outreach (Signed)
[facility]) Care Management [place] Telephone Outreach Unsuccessful call attempt #3  05/16/2017  Patrick Grant 26-Jun-1955 [id number]  Unsuccessful telephone outreach to [facility], 62 y/o male referred to [place] by telephonic [facility] RN CM on PCP request.Patient has history including, but not limited to, COPD, HTN/ HLD, DM- Type II, chronic pain syndrome, frequent falls, and lower extremity reflex sympathetic dystrophy.  HIPAA compliant voice mail message left for patient, requesting return call back.  Plan:  If I do not hear back from patient later today, will place case closure letter in mail to patient, requesting call back by mail  Will re-attempt call to patient in 9 business days if no response back from patient prior to then; if unsuccessful third attempt is made, will proceed to case closure  Oneta Rack, RN, BSN, [place] Coordinator [facility] Care Management  [phone]

## 2017-05-17 ENCOUNTER — Ambulatory Visit: Admit: 2017-05-17 | Payer: Medicare Other | Admitting: Gastroenterology

## 2017-05-17 SURGERY — COLONOSCOPY WITH PROPOFOL
Anesthesia: General

## 2017-05-23 ENCOUNTER — Encounter: Payer: Self-pay | Admitting: *Deleted

## 2017-05-23 ENCOUNTER — Other Ambulatory Visit: Payer: Self-pay | Admitting: *Deleted

## 2017-05-23 NOTE — Patient Outreach (Signed)
[facility]) Care Management [place] Telephone Outreach, Care Coordination  05/23/2017  Patrick Grant [birth year] [id number]  Received voicemail message from patient after he received case closure letter; requested call back  Successful telephone outreach to [facility] 62 y/o male referred to [place] by telephonic [facility] RN CM on PCP request.Patient has history including, but not limited to, COPD, HTN/ HLD, DM- Type II, chronic pain syndrome, frequent falls, and lower extremity reflex sympathetic dystrophy.  Briefly discussed with patient reason for sending case closure letter; patient reported he has been having phone issues and had not received any voice mail messages, from me or from his family members; states that he has now replaced his phone, and hopes this issue is resolved.  Discussed with patient that I would keep [place] CM case open, and possibility of scheduling phone call tomorrow-- patient stated he would like for me to contact him tomorrow at 1:00 pm; discussed with patient importance of maintaining contact with [place] RN CM if he wishes to remain active with program; patient verbalized understanding and agreement.  Plan:  Will attempt telephone outreach tomorrow at 1:00 pm, as requested by patient today  Oneta Rack, RN, BSN, CCRN [facility] Care Management  [phone]

## 2017-05-24 ENCOUNTER — Encounter: Payer: Self-pay | Admitting: *Deleted

## 2017-05-24 ENCOUNTER — Encounter: Payer: Self-pay | Admitting: Family Medicine

## 2017-05-24 ENCOUNTER — Telehealth: Payer: Self-pay | Admitting: Pharmacist

## 2017-05-24 ENCOUNTER — Ambulatory Visit (INDEPENDENT_AMBULATORY_CARE_PROVIDER_SITE_OTHER): Payer: Medicare Other | Admitting: Family Medicine

## 2017-05-24 ENCOUNTER — Other Ambulatory Visit: Payer: Self-pay | Admitting: *Deleted

## 2017-05-24 VITALS — BP 142/76 | HR 99 | Temp 98.5°F | Wt 223.4 lb

## 2017-05-24 DIAGNOSIS — E1100 Type 2 diabetes mellitus with hyperosmolarity without nonketotic hyperglycemic-hyperosmolar coma (NKHHC): Secondary | ICD-10-CM | POA: Diagnosis not present

## 2017-05-24 DIAGNOSIS — E782 Mixed hyperlipidemia: Secondary | ICD-10-CM | POA: Diagnosis not present

## 2017-05-24 DIAGNOSIS — G90522 Complex regional pain syndrome I of left lower limb: Secondary | ICD-10-CM | POA: Diagnosis not present

## 2017-05-24 DIAGNOSIS — R42 Dizziness and giddiness: Secondary | ICD-10-CM | POA: Diagnosis not present

## 2017-05-24 DIAGNOSIS — I10 Essential (primary) hypertension: Secondary | ICD-10-CM

## 2017-05-24 NOTE — Progress Notes (Signed)
Patient: Patrick Grant Male    DOB: [birth year]   62 y.o.   MRN: [id number] Visit Date: 05/24/2017  Today's Provider: Vernie Murders, PA   Chief Complaint  Patient presents with  . Diabetes  . Follow-up   Subjective:    HPI  Diabetes Mellitus Type II, Follow-up:   RecentLabs       Lab Results  Component Value Date   HGBA1C 9.3 (H) 02/06/2017   HGBA1C 10.8 11/16/2016   HGBA1C 7.0 07/24/2016      Last seen for diabetes 3 months ago.  Management since then includes advised patient to increase Metformin to 1000 mg BID and continue diabetic diet. He reports good compliance with treatment.  He is not having side effects.  Current symptoms include none Home blood sugar records: fasting range: averaging 200-300's. 263 this morning  Episodes of hypoglycemia? no              Current Insulin Regimen: none Most Recent Eye Exam: due Weight trend: decreasing steadily Prior visit with dietician: yes - 2018 [place]  Current diet: in general, a "healthy" diet   Current exercise: none due to cold weather and joint stiffness  Pertinent Labs: Labs(Brief)          Component Value Date/Time   CHOL 155 02/06/2017 0940   CHOL 190 03/22/2016 1003   TRIG 116 02/06/2017 0940   HDL 48 02/06/2017 0940   HDL 34 (L) 03/22/2016 1003   LDLCALC 133 (H) 03/22/2016 1003   CREATININE 0.92 02/06/2017 0940         Wt Readings from Last 3 Encounters:  02/22/17 227 lb (103 kg)  02/06/17 227 lb 12.8 oz (103.3 kg)  01/25/17 215 lb (97.5 kg)    ------------------------------------------------------------------------ Past Medical History:  Diagnosis Date  . COPD (chronic obstructive pulmonary disease) ([place])   . CRPS (complex regional pain syndrome)   . Diabetes mellitus without complication ([place])   . Hyperlipidemia   . Hypertension    Past Surgical History:  Procedure Laterality Date  . SPINAL CORD STIMULATOR REMOVAL  2010   DUE TO LOSS OF  MUSCLE USE WHEN ACTIVATED   Family History  Problem Relation Age of Onset  . CVA Mother   . Kidney failure Mother   . Diabetes Mother   . CVA Father   . Kidney failure Father   . Colon cancer Father   . Ovarian cancer Sister   . Diabetes Brother   . Healthy Sister    Allergies  Allergen Reactions  . [place] (Diagnostic) Swelling    throat  . Fentanyl Other (See Comments)    Other reaction(s): Other (See Comments) Jittery Other Reaction: Other reaction  . Clove Flavor Other (See Comments)    Loss of vision for 1-2 hrs    Current Outpatient Medications:  .  albuterol (PROVENTIL HFA;VENTOLIN HFA) 108 (90 Base) MCG/ACT inhaler, Inhale 2 puffs every 6 (six) hours as needed into the lungs for wheezing or shortness of breath., Disp: 1 Inhaler, Rfl: 2 .  azelastine (ASTELIN) 0.1 % nasal spray, Place 2 (two) times daily into both nostrils. , Disp: , Rfl:  .  empagliflozin (JARDIANCE) 25 MG TABS tablet, Take 25 mg by mouth daily., Disp: 90 tablet, Rfl: 3 .  fluticasone (FLONASE) 50 MCG/ACT nasal spray, Place daily into both nostrils. , Disp: , Rfl:  .  glucose blood (ONE TOUCH ULTRA TEST) test strip, CHECK FASTING BLOOD SUGAR TWICE DAILY (before breakfast  and supper)., Disp: 200 each, Rfl: 4 .  lisinopril (PRINIVIL,ZESTRIL) 10 MG tablet, TAKE ONE TABLET BY MOUTH ONCE DAILY, Disp: 90 tablet, Rfl: 3 .  metFORMIN (GLUCOPHAGE) 1000 MG tablet, Take 1 tablet (1,000 mg total) by mouth 2 (two) times daily with a meal., Disp: 180 tablet, Rfl: 3 .  nitroGLYCERIN (NITROSTAT) 0.4 MG SL tablet, Place 1 tablet (0.4 mg total) under the tongue every 5 (five) minutes as needed., Disp: 25 tablet, Rfl: 6 .  pravastatin (PRAVACHOL) 20 MG tablet, TAKE ONE TABLET BY MOUTH ONCE DAILY, Disp: 90 tablet, Rfl: 3 .  tiotropium (SPIRIVA HANDIHALER) 18 MCG inhalation capsule, Place 1 capsule (18 mcg total) into inhaler and inhale daily., Disp: 90 capsule, Rfl: 3 .  mometasone-formoterol (DULERA) 100-5 MCG/ACT AERO,  Inhale 2 puffs into the lungs 2 (two) times daily at 10 AM and 5 PM. (Patient not taking: Reported on 05/13/2017), Disp: 13 g, Rfl: 3  Review of Systems  Constitutional: Negative.   Respiratory: Positive for shortness of breath.   Cardiovascular: Negative.   Neurological: Positive for dizziness.   Social History   Tobacco Use  . Smoking status: Former Smoker    Years: 25.00    Types: Cigars    Last attempt to quit: 03/12/2016    Years since quitting: 1.2  . Smokeless tobacco: Never Used  Substance Use Topics  . Alcohol use: No    Alcohol/week: 0.0 oz   Objective:   BP (!) 142/76 (BP Location: Right Arm, Patient Position: Sitting, Cuff Size: Normal)   Pulse 99   Temp 98.5 F (36.9 C) (Oral)   Wt 223 lb 6.4 oz (101.3 kg)   SpO2 97%   BMI 29.47 kg/m   Physical Exam  Constitutional: He is oriented to person, place, and time. He appears well-developed and well-nourished.  HENT:  Head: Normocephalic.  Right Ear: External ear normal.  Left Ear: External ear normal.  Nose: Nose normal.  Mouth/Throat: Oropharynx is clear and moist.  Eyes: Conjunctivae are normal.  Neck: Neck supple. No JVD present.  Cardiovascular: Normal rate and regular rhythm.  Pulmonary/Chest: Effort normal and breath sounds normal.  Abdominal: Soft. Bowel sounds are normal.  Musculoskeletal: He exhibits tenderness.  Extreme pain in the left foot with shooting pains radiating up to the knee occasionally. Foot sensitive to temperature and barometric pressure changes. Can't tolerate touch or shoes. Pain causes palms and soles of feet to sweat when intense.  Lymphadenopathy:    He has no cervical adenopathy.  Neurological: He is alert and oriented to person, place, and time.      Assessment & Plan:      1. Type 2 diabetes mellitus with hyperosmolarity without coma, without long-term current use of insulin (HCC) FBS at home have been in the 200's range (was 263 this morning). Still taking Metformin 1000 mg  BID but unable to tolerate sulfonylureas due to hypoglycemia. Was to try the Jardiance but having difficulty affording it.[facility] Care Management Pharmacy Resident attempting to get him on a medication assistance program. Continue diet and present regimen. Recheck labs and follow up pending reports. - CBC with Differential/Platelet; Future - Comprehensive metabolic panel; Future - Hemoglobin A1c; Future  2. Complex regional pain syndrome type 1 of left lower extremity Onset in 2008 following a fracture in the left foot. Continues to use crutches to walk and unable to wear shoes due to constant severe pain. Attempt to use narcotic analgesic in the past was unsuccessful and stopped due to fear  of addiction. Recheck labs. - CBC with Differential/Platelet; Future - Comprehensive metabolic panel; Future  3. Combined fat and carbohydrate induced hyperlipemia Tolerating Pravastatin 20 mg qd and low fat diet. Unable to exercise much due to CRPS and poor balance. Continue present regimen and check Lipid Panel with CMP.  - Lipid panel; Future - Comprehensive metabolic panel; Future  4. Essential (primary) hypertension Fair control without chest pains, palpitations or edema. Continue to take Lisinopril 10 mg qd without side effects. Check CBC and CMP. Continue to restrict salt and caffeine intake. - CBC with Differential/Platelet; Future - Comprehensive metabolic panel; Future  5. Dizziness Intermittent dizziness/vertigo 3-4 times a week for a few minutes for the past 3 months. States he feels as if the he "just came off a merry-go-round". Last episode 1 week ago caused him to fall at home. No LOC or headaches. No vision disturbance or palpitations. Will get labs and consider need for brain scan or referral to neurologist/ENT pending reports. - CBC with Differential/Platelet; Future - Comprehensive metabolic panel; Future       Vernie Murders, PA  [facility]

## 2017-05-24 NOTE — Patient Outreach (Signed)
[facility]) Care Management [place] Telephone Outreach    05/24/2017  DEAARON FULGHUM Jun 29, 1956 [id number]  Successful telephone outreach to [facility], 62 y/o male referred to [place] by telephonic [facility] RN CM on PCP request.Patient has history including, but not limited to, COPD, HTN/ HLD, DM- Type II, chronic pain syndrome, frequent falls, and lower extremity reflex sympathetic dystrophy.  HIPAA/ identity verified with patient during phone call today.  Confirmed through review of EMR that [facility] CSW obtained written consent from patient.  Today, patient reports that he "is doing pretty good," and he states "nothing is really any different than" last [place] outreach; denies changes to chronic leg pain or breathing status.  Patient continues to decline [facility] RN CCM in-home visit, stating that his dogs remain an "issue," and "even bit someone last week."  Patient sounds to be in no obvious/ apparent distress throughout phone call today.  Patient further reports:  Medications: -- Has "most" medicationsand takes as prescribed;denies questions about current medications.  -- confirms that he is actively participating with [facility] Pharmacists for stated needs for medications assistance  Provider appointments: -- All upcoming provider appointments were reviewed with patient today; to attend scheduled PCP appointment later this afternoon -- attended pulmonary appointment May 13, 2017; verbalizes plans to attend upcoming PFT's and sleep study, which were scheduled as a result of visit  Safety/ Mobility/ Falls: -- again admits multiple falls, with most recent being "last week," when he fell off porch steps due to wet weather.  Denies injury with recent fall, states did not need to go to hospital for falls -- assistive devices: has motorized wheelchair, "but battery is bad, it's not currently working;" states he hopes to replace batteries soon, but  doesn't know, depending on his finances; "for now" continues using crutches for ambulation, due to his ongoing frequent dizziness and LE pain; states he can not use walker as he reamins unable to bear weight on his (L) leg due to chronic neuromuscular disease -- general fall risks/ prevention education were reiterated with patient today; encouraged patient to continue using assistive devices and to move slowly and to sit/ lie down when he is dizzy -- reports "doesn't see well at all."  Again discussed need to schedule prompt eye doctor appointment, but he reports that he will be unable to do this "any time soon," due to "so many other appointment."  Social/ Community Resource needs: -- verbalizes that he has spoke to [place], and denies further/ ongoing community resource needs -- family, or room mate [name] continue providing transportation for patient to all provider appointments, errands, Facilities manager of chronic disease states of COPD, DM, and chronic pain: -- continues monitoring/ recording daily fasting blood glucose levels every morning; reports general range of fasting blood sugars 200-300, with fasting blood sugar this morning of "227."  Patient reports one isolated reading of > 450; states that he will share recent blood glucose readings with PCP this afternoon, and that he expects that PCP may perform  A1-C; continues to report that he would like to concentrate on his COPD rather than his DM for [facility] RN CCM outreach -- patient reports continues "easily short of breath with any activity at all," stating that he continues using his rescue inhaler "about 3-6 times per week." -- reports that he discussed his ongoing dizziness with use of CPAP with his pulmonology provider and sleep study has been scheduled, as well  as PFT's "next week."  Patient reports that he has continued being dizzy "about every day," but endorses today that he is now consistently using his CPAP at  night -- discussed episodes of shortness of breath, current action plan; stop, sit down, and rest, and use rescue inhaler.  Provided education around COPD zones and corresponding action plan; patient reports unfamiliar with COPD zones,a dn these were discussed thoroughly with patient today; explained that I would place printed educational material in the mail to patient for his further review, and encouraged him to promptly review and write down any questions he has around COPD zones/ action plans; patient is agreeable to this.  Patient denies further issues, concerns, or problems today. I provided/ confirmed that patient hasmy direct phone number, the main [facility] CM office phone number, and the [facility] CM 24-hour nurse advice phone number should issues arise prior to next scheduled [place] outreach, with scheduled telephone call in 2 weeks.  Again emphasized with patient need to maintain contact with [facility] RN CCM, and shared with him time of next scheduled telephone outreach, since patient declines home visit.  Encouraged patient to contact me directly if needs, questions, issues, or concerns arise prior to next scheduled outreach; patient agreed to do so.   Plan:  Patient will take medications as prescribed and will attend all scheduled provider appointments  Patient will promptly notify his care providers for any new concerns, issues, or problems that arise  Patient will actively engage/ communicate with [facility] Pharmacist for stated medication assistance needs  Patient will monitor and record his use of CPAP and dizzy spells, and will share his data with pulmonary provider at upcoming office visit  I will place printed educational materials re: COPD self-health management in mail to patient, and patient will promptly review and write down any questions he has for further discussion during [place] outreach  [place] CM involvement in patient's care to continue with scheduled  telephone outreach in 2 weeks  [facility] CM Care Plan Problem One     Most Recent Value  Care Plan Problem One  Need for self-health management of chronic disease state of COPD, as evidenced by patient reporting   Role Documenting the Problem One  Care Management Coordinator  Care Plan for Problem One  Active  [facility] Long Term Goal   Over the next 60 days, patient will be able to verbalize 3 strategies for self-health management of chronic disease state of COPD, as evidenced by patient reporting of same during [place] outreach  [place] Term Goal Start Date  04/26/17  Interventions for Problem One Long Term Goal  Discussed with patient his current clinical status,  use of CPAP at home,  recent and upcoming provider appointments.  Reiterated and discussed concept of self-health management action planning for COPD and provided education around COPD zones,  explained to patient that I would place printed educational material in mail to patient, encouraged him to review promptly and write down any questions he has as he reviews  [facility] CM Short Term Goal #1   Over the next 18 days, patient will monitor and record frequency of morning dizzy episodes around his current use of CPAP, and will take this recorded log to next upcoming pulmonary provider office visit on 05/13/17, as evidenced by patient reporting and review of EMR provider office notes during [facility] RN CCM outreach  [facility] CM Short Term Goal #1 Start Date  04/26/17  [facility] CM Short  Term Goal #1 Met Date  05/24/17 - goal met  [facility] CM Short Term Goal #2   Over the next 30 days, patient will attend scheduled sleep study and PFT appointments, as evidenced by patient reporting and review of EMR during [place] outreach  [facility] CM Short Term Goal #2 Start Date  05/24/17  Interventions for Short Term Goal #2  Discussed upcoming scheduled PFT's and sleep study with patient and confirmed that he is planning to attend both appointments     Oneta Rack, RN, BSN,  [place] Coordinator [facility] Care Management  [phone]

## 2017-05-24 NOTE — Patient Outreach (Signed)
[place] [facility]) Care Management  05/24/2017  Patrick Grant [birth year] [id number]   62 y.o. year old male referred to [place] for [place] (Pharmacy Pt Assistance F/u) and Medication Assistance (Pharmacy telephone f/u)    PMH s/f:HTN, T2DM, complex regional pain syndrome of lower limb, hyperlipidemia, COPD/emphysema(per patient report).  Patient withHumanamedicare advantage plan.  Called to follow up on patient assistance application statuses:  1. [employer] - name incorrect on application, application and attestation form mailed to patient on 2/27 2. [employer] - have not received application yet, representative recommended I call back in 10 business days.   Called patient to relay the above information. Patient verbalized understanding and states he will call me when he receives [facility] application materials.    A/P #Medication Assistance - patient is not in donut hole and is interested in applying for medication assistance through manufacturer programs for the following medications: Jardiance, Spiriva; Dulera -Will follow up with [facility] and BI within 10 business days -Will follow up with patient within 10 business days, at minimum.   [name], Pharm.D., BCPS PGY2 Ambulatory Care Pharmacy Resident Phone: [phone]

## 2017-05-27 ENCOUNTER — Encounter: Payer: Self-pay | Admitting: *Deleted

## 2017-05-27 ENCOUNTER — Ambulatory Visit: Payer: Self-pay | Admitting: *Deleted

## 2017-05-29 ENCOUNTER — Ambulatory Visit: Payer: Self-pay | Admitting: Internal Medicine

## 2017-06-05 ENCOUNTER — Ambulatory Visit: Payer: Medicare Other | Admitting: Internal Medicine

## 2017-06-05 ENCOUNTER — Other Ambulatory Visit: Payer: Self-pay

## 2017-06-05 DIAGNOSIS — E782 Mixed hyperlipidemia: Secondary | ICD-10-CM

## 2017-06-05 DIAGNOSIS — E1100 Type 2 diabetes mellitus with hyperosmolarity without nonketotic hyperglycemic-hyperosmolar coma (NKHHC): Secondary | ICD-10-CM

## 2017-06-05 DIAGNOSIS — I10 Essential (primary) hypertension: Secondary | ICD-10-CM | POA: Diagnosis not present

## 2017-06-05 DIAGNOSIS — J449 Chronic obstructive pulmonary disease, unspecified: Secondary | ICD-10-CM

## 2017-06-05 DIAGNOSIS — R0602 Shortness of breath: Secondary | ICD-10-CM | POA: Diagnosis not present

## 2017-06-05 DIAGNOSIS — R42 Dizziness and giddiness: Secondary | ICD-10-CM

## 2017-06-05 DIAGNOSIS — G90522 Complex regional pain syndrome I of left lower limb: Secondary | ICD-10-CM | POA: Diagnosis not present

## 2017-06-06 ENCOUNTER — Telehealth: Payer: Self-pay

## 2017-06-06 ENCOUNTER — Other Ambulatory Visit: Payer: Self-pay | Admitting: Family Medicine

## 2017-06-06 LAB — COMPREHENSIVE METABOLIC PANEL WITH GFR
ALT: 28 IU/L (ref 0–44)
AST: 15 IU/L (ref 0–40)
Albumin/Globulin Ratio: 2 (ref 1.2–2.2)
Albumin: 4.3 g/dL (ref 3.5–5.5)
Alkaline Phosphatase: 92 IU/L (ref 39–117)
BUN/Creatinine Ratio: 13 (ref 9–20)
BUN: 12 mg/dL (ref 6–24)
Bilirubin Total: 0.4 mg/dL (ref 0.0–1.2)
CO2: 20 mmol/L (ref 20–29)
Calcium: 9.2 mg/dL (ref 8.7–10.2)
Chloride: 100 mmol/L (ref 96–106)
Creatinine, Ser: 0.92 mg/dL (ref 0.76–1.27)
GFR calc Af Amer: 105 mL/min/1.73
GFR calc non Af Amer: 91 mL/min/1.73
Globulin, Total: 2.2 g/dL (ref 1.5–4.5)
Glucose: 231 mg/dL — ABNORMAL HIGH (ref 65–99)
Potassium: 4.5 mmol/L (ref 3.5–5.2)
Sodium: 136 mmol/L (ref 134–144)
Total Protein: 6.5 g/dL (ref 6.0–8.5)

## 2017-06-06 LAB — CBC WITH DIFFERENTIAL/PLATELET
Basophils Absolute: 0 x10E3/uL (ref 0.0–0.2)
Basos: 0 %
EOS (ABSOLUTE): 0.2 x10E3/uL (ref 0.0–0.4)
Eos: 2 %
Hematocrit: 40.9 % (ref 37.5–51.0)
Hemoglobin: 13.8 g/dL (ref 13.0–17.7)
Immature Grans (Abs): 0 x10E3/uL (ref 0.0–0.1)
Immature Granulocytes: 0 %
Lymphocytes Absolute: 2.1 x10E3/uL (ref 0.7–3.1)
Lymphs: 26 %
MCH: 30.6 pg (ref 26.6–33.0)
MCHC: 33.7 g/dL (ref 31.5–35.7)
MCV: 91 fL (ref 79–97)
Monocytes Absolute: 0.6 x10E3/uL (ref 0.1–0.9)
Monocytes: 7 %
Neutrophils Absolute: 5 x10E3/uL (ref 1.4–7.0)
Neutrophils: 65 %
Platelets: 280 x10E3/uL (ref 150–379)
RBC: 4.51 x10E6/uL (ref 4.14–5.80)
RDW: 12.7 % (ref 12.3–15.4)
WBC: 7.9 x10E3/uL (ref 3.4–10.8)

## 2017-06-06 LAB — HEMOGLOBIN A1C
Est. average glucose Bld gHb Est-mCnc: 263 mg/dL
Hgb A1c MFr Bld: 10.8 % — ABNORMAL HIGH (ref 4.8–5.6)

## 2017-06-06 LAB — LIPID PANEL
Chol/HDL Ratio: 3.4 ratio (ref 0.0–5.0)
Cholesterol, Total: 141 mg/dL (ref 100–199)
HDL: 41 mg/dL
LDL Calculated: 78 mg/dL (ref 0–99)
Triglycerides: 109 mg/dL (ref 0–149)
VLDL Cholesterol Cal: 22 mg/dL (ref 5–40)

## 2017-06-06 NOTE — Telephone Encounter (Signed)
-----   Message from Margo [place], [place] sent at 06/06/2017  8:26 AM EDT ----- All blood counts, cholesterol, kidney function and liver function tests are normal. Blood sugar and Hgb A1C is higher. If unable to get Jardiance yet, may need to try getting another medication. Continue to check FBS daily and take Metformin 1000 mg BID.

## 2017-06-06 NOTE — Telephone Encounter (Signed)
Pt advised. He has not been able to start Jardiance, and agrees to start a different medication.

## 2017-06-06 NOTE — Telephone Encounter (Signed)
Should consider once a week injection of Trulicity or Ozempic.

## 2017-06-07 ENCOUNTER — Ambulatory Visit: Payer: Self-pay | Admitting: Pharmacist

## 2017-06-07 ENCOUNTER — Telehealth: Payer: Self-pay | Admitting: Pharmacist

## 2017-06-07 ENCOUNTER — Other Ambulatory Visit: Payer: Self-pay | Admitting: Family Medicine

## 2017-06-07 DIAGNOSIS — E1100 Type 2 diabetes mellitus with hyperosmolarity without nonketotic hyperglycemic-hyperosmolar coma (NKHHC): Secondary | ICD-10-CM

## 2017-06-07 MED ORDER — GLUCOSE BLOOD VI STRP: ORAL_STRIP | 9 refills | Status: DC

## 2017-06-07 NOTE — Telephone Encounter (Signed)
Notified patient of approval for Spiriva and Jardiance. He states he corrected the application form for [facility] and sent it back (said the form had his name and the provider's name reversed).

## 2017-06-07 NOTE — Patient Outreach (Signed)
[place] [facility]) Care Management  06/07/2017  RUTGER SALTON 02-26-1956 [id number]  62 y.o. year old male referred to [place] for Medication Assistance (Pharmacy telephone f/u) PMH s/f:HTN, T2DM, complex regional pain syndrome of lower limb, hyperlipidemia, COPD/emphysema(per patient report).  Patient withHumanamedicare advantage plan.  Called to follow up on patient assistance application statuses:  1. [place] on 06/04/17 to follow up on [facility] application, Representative reports they are still waiting on patient to correct name on application and mail back application+attestation form. Form mailed on 05/22/2017 to patient home. 3  2. BI - Called on 8/47/8412- Application approved for Spiriva and Jardiance effective 06/01/2017-03/25/2018, shipping out this week.  --------------------------------- Was unable to reach patient via telephone today and was unable to leave  HIPAA compliant voicemail (unsuccessful outreach #1).  Plan: Will followup within 2 business days via telephone Will route this note to primary care provider, [name] [place]ism[place].   [name], Pharm.D., BCPS PGY2 Ambulatory Care Pharmacy Resident Phone: [phone]

## 2017-06-10 ENCOUNTER — Telehealth: Payer: Self-pay | Admitting: Pharmacist

## 2017-06-10 ENCOUNTER — Encounter: Payer: Self-pay | Admitting: Internal Medicine

## 2017-06-10 ENCOUNTER — Encounter: Payer: Self-pay | Admitting: Family Medicine

## 2017-06-10 NOTE — Patient Outreach (Signed)
[place] [facility]) Care Management  06/10/2017  Patrick Grant 1956/01/28 [id number]   62 y.o. year old male referred to [place] for Medication Assistance (Pharmacy telephone outreach #2)  Received communication from [place], [place] that patient has received form from [facility] and has corrected name and returned the application.  Called patient - he confirms this was sent "a long time ago".   Called [employer] to follow up on [place] - they report he was approved effective 06/10/2017 through 03/25/2018.   Called patient back - he reports he has not received jardiance or spiriva yet but will look for these and the dulera in the mail.   Plan: Will follow up with patient next week  [name], Pharm.D., BCPS PGY2 Ambulatory Care Pharmacy Resident Phone: [phone]

## 2017-06-10 NOTE — Progress Notes (Signed)
[place], [address]  DATE OF SERVICE:  June 05, 2017  Complete Pulmonary Function Testing Interpretation:  FINDINGS:   forced vital capacity is moderately reduced.  The FEV1 is 0.96 L which is 24% of predicted and is severely reduced.  Post bronchodilator there is no significant improvement in FEV1 however clinical improvement may occur in the absence of spirometric improvement and does not preclude the use of bronchodilators.  FEV1 FVC ratio is severely reduced.  Total lung capacity by nitrogen washout is within normal limits residual volume is increased residual volume total lung capacity is increased FRC is increased the DLCO is severely reduced  IMPRESSION:   this pulmonary function studies consistent with severe obstructive lung disease.  There is no significant response to bronchodilators however clinical improvement may occur in the absence of spirometric improvement in does not preclude the use of bronchodilators  Allyne Gee, MD [facility] Pulmonary Critical Care Medicine Sleep Medicine

## 2017-06-10 NOTE — Telephone Encounter (Signed)
I notified the patient of the Spiriva and Jardiance approval Friday 06-07-17 (see Patient Outreach message in NOTES). They are still working on the [facility] since my name was mixed up with the patient's name. He sent the corrected form to them.

## 2017-06-10 NOTE — Patient Instructions (Signed)

## 2017-06-10 NOTE — Telephone Encounter (Signed)
Financial assistance for Patrick Grant was approved through community resources correct?

## 2017-06-10 NOTE — Telephone Encounter (Signed)
Please inform pt

## 2017-06-11 ENCOUNTER — Ambulatory Visit: Payer: Self-pay | Admitting: Pharmacist

## 2017-06-11 ENCOUNTER — Encounter: Payer: Self-pay | Admitting: Family Medicine

## 2017-06-12 ENCOUNTER — Other Ambulatory Visit: Payer: Self-pay | Admitting: *Deleted

## 2017-06-12 ENCOUNTER — Encounter: Payer: Self-pay | Admitting: *Deleted

## 2017-06-12 NOTE — Patient Outreach (Addendum)
[place] [facility]) Care Management [place] Telephone Outreach  06/12/2017  Patrick Grant [birth year] [id number]  Successful telephone outreach to [facility], 62 y/o male referred to [place] by telephonic [facility] RN CM on PCP request.Patient has history including, but not limited to, COPD, HTN/ HLD, DM- Type II, chronic pain syndrome, frequent falls, and lower extremity reflex sympathetic dystrophy.HIPAA/ identity verified with patient during phone call today.   Today, patient reports that he "is having a pretty good day," and he reports that he "is not really having bad pain today."  Patient continues to decline [facility] RN CCM in-home visit, stating that his dogs remain an "issue and the fence isn't fixed."  Patient sounds to be in no obvious/ apparent distress throughout phone call today.    Patient further reports:  Medications: -- Has "most" medicationsand takes as prescribed;denies questions about current medications. -- medication review completed with patient today; reports that he received medications in mail as a result of [facility] Pharmacist assistance -- confirms is not using Dulera inhaler, states that he has not had this medication and that [facility] Pharmacist is following up; declines concern with not having this inhaler, states that his other inhalers "seem to be working;" encouraged patient to continue actively participating in [facility] Pharmacist follow up  Provider appointments: -- All upcoming provider appointments were reviewed with patient today; reports attended scheduled PCP appointment 05/24/17 -- attended pulmonary appointment "last week" on June 05, 2017; reports provider told him that his COPD has "worsened and is severe."  -- verbalizes plans to attend upcoming sleep study, scheduled July 10, 2017  Safety/ Mobility/ Falls: --patient denies new/ recent falls since last [facility] RN CCM outreach on May 24, 2017; however, he remains a high fall  risk; continues using crutches for ambulation, due to ongoing frequentdizziness and chronic LE pain; can not use walker as he remains unable to bear weight on his (L) leg due to chronic neuromuscular disease  Self-health management ofchronic disease states of COPD, DM, and chronic pain: -- continues monitoring/ recording daily fasting blood glucose levels every morning; reports fasting blood sugars "staying usually between 200-300."  Accurately reports last A1-C value on May 24, 2017; acknowledges that this "is higher then it was before."  Continues endorsing that he takes medications as prescribed, and is hopeful that new medication he has recently received in mail "will help."  Encouraged patient to continue monitoring and recording blood sugars, and to continue maintaining contact with PCP, who manages his DM -- reports continues "easily short of breath with any activity at all," stating that "this is how I always am;" reports continues using his rescue inhaler "about 3-6 times per week," states "used last yesterday." -- reports he has resumed using CPAP unit every night, despite his previous report that using CPAP increased his dizziness; reports dizzy spells "come and go," and states that pulmonology provider recommended that he continue using CPAP, so he has been using regularly.  Reports dizzy spells "go away for several days, and then comes back." -- discussed periods of shortness of breath, current action plan; stop, sit down, and rest, and use rescue inhaler.  Patient confirmed that he received previously mailed EMMI education around COPD zones and corresponding action plan; today, we reviewed these educational materials thoroughly during phone call.  Patient denies specific questions around educational material, and we concentrated/ focused on COPD zones and corresponding action plans for each zone.  Patient reports that he believes he is "in  green zone" today.  Patient denies further issues,  concerns, or problems today. Iconfirmed that patient hasmy direct phone number, the main [facility] CM office phone number, and the [facility] CM 24-hour nurse advice phone number should issues arise prior to next scheduled [facility] Community CM outreach, with scheduled telephone call in one month after completion of upcoming scheduled sleep study.  Discussed with patient possibility of placing referral to [facility] RN Health Coach with next telephone outreach, and he verbalizes agreement with this general plan.  Encouraged patient to contact me directly if needs, questions, issues, or concerns arise prior to next scheduled outreach; patient agreed to do so.  Plan:  Patient will take medications as prescribed and will attend all scheduled provider appointments  Patient will promptly notify his care providers for any new concerns, issues, or problems that arise  Patient will remain actively engaged with [facility] Pharmacist for stated medication assistance needs  Will share today's notes from [facility] Community CM with patient's PCP, as he continues to refuse home visit  [facility] Community CM involvement in patient's care to continue with scheduled telephone outreach in 4 weeks after completion of sleep study  [facility] CM Care Plan Problem One     Most Recent Value  Care Plan Problem One  Need for self-health management of chronic disease state of COPD, as evidenced by patient reporting   Role Documenting the Problem One  Care Management Coordinator  Care Plan for Problem One  Active  [facility] Long Term Goal   Over the next 60 days, patient will be able to verbalize 3 strategies for self-health management of chronic disease state of COPD, as evidenced by patient reporting of same during [facility] CCM RN outreach  [facility] Long Term Goal Start Date  04/26/17  [facility] Long Term Goal Met Date  06/12/17-- Goal met  Interventions for Problem One Long Term Goal  Discussed with patient current breathing status/ clinical condition,  Discussed printed  educational material mailed to patient and COPD zones,  discussed recent provider appointments with patient and current use of medications  [facility] CM Short Term Goal #2   Over the next 30 days, patient will attend scheduled sleep study and PFT appointments, as evidenced by patient reporting and review of EMR during [facility] RN CCM outreach  [facility] CM Short Term Goal #2 Start Date  06/12/17 [Goal extended/ modified today]  Interventions for Short Term Goal #2  Discussed with patient recent pulmonology provider office visit,  re-scheduled sleep study for July 10, 2017,  reviewed previously provided EMMI education around sleep study with patient     Laine Mckinney Tousey, RN, BSN, CCRN [employer] Community Care Coordinator [facility] Care Management  [phone]   

## 2017-06-14 ENCOUNTER — Encounter: Payer: Self-pay | Admitting: Family Medicine

## 2017-06-19 ENCOUNTER — Other Ambulatory Visit: Payer: Self-pay | Admitting: Pharmacist

## 2017-06-19 ENCOUNTER — Telehealth: Payer: Self-pay | Admitting: Pharmacist

## 2017-06-19 NOTE — Patient Outreach (Signed)
[place] [facility]) Care Management  06/19/2017  Patrick Grant 22-Nov-1955 [id number]  Pt reports he got spiriva and jardiance. Using Spiriva once in the morning (handihaler). Taking Jardiance once in morning as well.   Wants to switch to [facility] as they offer free delivery, reports he is going to make that switch once he can go in person. Needs ride from friend, [name] who is out of town. Reports he has all medications except Dulera and states he has some test strips remaining from last fill.   Patient reports improved fasting blood glucose, down to 190s after 4 days of Jardiance.  Reports he is feeling much better - more energy. Has been able to mow his lawn. Also reports daily pill box has been an "unbelievable help". Has not missed medications or questioned if he took the dose or not.    --------------------------- 06/20/2017 Called [facility] patient assistance pharmacy to determine where [name] was in the shipping process. Per [name] with [facility] pharmacy - Representative reports that [name] will be delivered on Saturday. [employer] called patient to determine if he would rather the package be delivered and left on doorstep or require signature. Patient states that he will be there Saturday and chose to have it delivered with signature required.   Will follow up with patient next week to ensure he received shipment.   [name], Pharm.D., BCPS PGY2 Ambulatory Care Pharmacy Resident Phone: [phone]

## 2017-06-21 ENCOUNTER — Encounter: Payer: Self-pay | Admitting: Family Medicine

## 2017-06-26 ENCOUNTER — Ambulatory Visit: Payer: Self-pay | Admitting: Pharmacist

## 2017-06-26 ENCOUNTER — Telehealth: Payer: Self-pay | Admitting: Pharmacist

## 2017-06-26 NOTE — Patient Outreach (Signed)
[place] [facility]) Care Management  06/26/2017  Patrick Grant 1956-03-11 [id number]  62 y.o. year old male referred to [place] for Medication Assistance (Pharmacy telephone outreach) and Case Closure   Patient confirms identity with HIPAA-identifiers x2 and gives verbal consent to speak over the phone about medications.   Patient reports he received Dulera and reads instructions to me that are written on the box: 2 puffs BID. Patient confirms adherence and that he rinses out mouth and spits out water after use. Reports he has also received Jardiance and Spiriva and confirms adherence.   Reports improved blood glucoses down to 90s-100s, denies hypoglycemia. Also reports improved breathing and energy levels. He thanks me for my help obtaining medications.   Patient denies further pharmacy needs at this time. Will close pharmacy case. Informed patient that [facility] RN is still active with his case.   [name], Pharm.D., BCPS PGY2 Ambulatory Care Pharmacy Resident Phone: [phone]

## 2017-07-05 DIAGNOSIS — J439 Emphysema, unspecified: Secondary | ICD-10-CM | POA: Insufficient documentation

## 2017-07-10 ENCOUNTER — Ambulatory Visit: Payer: Medicare Other

## 2017-07-10 DIAGNOSIS — G4733 Obstructive sleep apnea (adult) (pediatric): Secondary | ICD-10-CM | POA: Diagnosis not present

## 2017-07-10 NOTE — Progress Notes (Signed)
95 percentile pressure 9   95th percentile leak 70.8   apnea index 1.3 /hr  apnea-hypopnea index  1.4 /hr   total days used  >4 hr 30 days  total days used <4 hr 0 days  Total compliance 100 percent

## 2017-07-12 ENCOUNTER — Other Ambulatory Visit: Payer: Self-pay | Admitting: *Deleted

## 2017-07-12 ENCOUNTER — Encounter: Payer: Self-pay | Admitting: *Deleted

## 2017-07-12 NOTE — Patient Outreach (Signed)
[facility]) Care Management [facility] Community CM Telephone Outreach CM case closure- referral to [place]  07/12/2017  Krystal Delduca Hanaway 10-Jun-1955 [id number]  Successful telephone outreach to [facility], 62 y/o male referred to [place] by telephonic [facility] RN CM on PCP request.Patient has history including, but not limited to, COPD, HTN/ HLD, DM- Type II, chronic pain syndrome, frequent falls, and lower extremity reflex sympathetic dystrophy.HIPAA/ identity verified with patient during phone call today.  Today, patient reports that he "seems to be doing much better."  Reports that he "can tell a big difference" now that he has and is using "new inhaler, Dulera."  Patient reports ongoing chronic pain of (L) leg, "no changes" in pain levels; reports pain has been "somewhat better" since weather is warming up, but still "comes and goes."  Patient sounds to be in no apparent/ obvious distress throughout today's phone call, and he denies new/ recent falls.   Patient further reports:  Medications: -- Hasmedicationsand takes as prescribed;denies questions/concerns/ recent changes around current medications. -- confirms is now using Dulera inhaler, as [facility] Pharmacist arranged; reports "Dulera really helping" with breathing   Provider appointments: -- All upcoming provider appointments were reviewed with patient today; verbalizes plans to attend and confirms that roommate will continue to provide transportation to all scheduled provider appointments  -- attended recent scheduled sleep study July 10, 2017; reports today that "there was a mix-up in the way the appointment was scheduled;" states that it "was not an actual sleep study," but was instead "a check of" his CPAP machine; reports the CPAP machine was checked, but added that technician "was unable to down load the information" needed; stated that he will attend upcoming pulmonary provider office  visit for follow up on this matter  Safety/ Mobility/ Falls: --patient denies new/ recent falls since last [facility] RN CCM outreach; continues primarily usingcrutches for ambulation, due to ongoing chronic LE pain; can not use walker as he remains unable to bear weight on his (L) leg due to chronic neuromuscular disease  Self-health management ofchronic disease states of COPD: -- reportsno longer "short of breath as easily with any activity" since starting dulera.  Reports continuesusinghis rescue inhaler "less."  -- reports he has continued using CPAP unit every night, and states that his previously reported dizziness with CPAP use "is much better;" reports dizzy spells "still come and go," but "seem to occur less frequently." -- discussed previously provided education around COPD zones and corresponding action plan; patient is able to accurately discuss corresponding action plans for COPD zones.  Patient denies specific questions today, and we discussed that patient has thus far met his previously stated [facility] Community CM goals; patient denies further care coordination needs, and we discussed possibility of [place] referral; patient confirmed that he would appreciate ongoing reinforcement of self-health management of chronic disease state of COPD, and requests [facility] RN health Coach referral be placed.  Discussed with patient [place] role, and encouraged him to enage with [facility] RN health coach when she contacts him by phone, which patient agreed to do.  Patient denies further issues, concerns, or problems today.   Plan:  Patient will engage with [facility] RN health Coach  I will make patient's PCP aware of [facility] Community CM case closure with referral to [place] Problem One     Most Recent Value  Care Plan Problem One  Need  for self-health management of chronic disease state of COPD, as evidenced by patient reporting   Role Documenting the  Problem One  Care Management Coordinator  Care Plan for Problem One  Not Active  [facility] Long Term Goal   Over the next 60 days, patient will be able to verbalize 3 strategies for self-health management of chronic disease state of COPD, as evidenced by patient reporting of same during [place] outreach  [place] Term Goal Start Date  04/26/17  [facility] Long Term Goal Met Date  06/12/17 - Goal Met  [facility] CM Short Term Goal #2   Over the next 30 days, patient will attend scheduled sleep study and PFT appointments, as evidenced by patient reporting and review of EMR during [facility] RN CCM outreach  [facility] CM Short Term Goal #2 Start Date  06/12/17 [Goal extended/ modified today]  [facility] CM Short Term Goal #2 Met Date  07/12/17 - Goal met  Interventions for Short Term Goal #2  Discussed with patient recently attended appointment for sleep study,  patient's current clinical status/ use of medications,  discussed with patient referral to [place], and placed this referral today     [facility] CM Care Plan Problem One     Most Recent Value  Care Plan Problem One  Need for ongoing reinforcement of self-health management of chronic disease state of COPD, as evidenced by patient reporting of same  Role Documenting the Problem One  Care Management Coordinator  Care Plan for Problem One  Active           [facility] CM Short Term Goal #1   Over the next 30 days, patient will communicate with [place], as evidenced by successful completion of [place] telephone call with patient  [facility] CM Short Term Goal #1 Start Date  07/12/17  Interventions for Short Term Goal #1  Discussed role of [place] with patient and confirmed that patient would like referral to [place] placed,  placed referral for same and notified patient's PCP of referral placement    Oneta Rack, RN, BSN, [place] Coordinator [facility] Care Management  [phone]

## 2017-07-15 ENCOUNTER — Encounter: Payer: Self-pay | Admitting: *Deleted

## 2017-07-22 ENCOUNTER — Encounter: Payer: Self-pay | Admitting: Family Medicine

## 2017-07-23 ENCOUNTER — Ambulatory Visit (INDEPENDENT_AMBULATORY_CARE_PROVIDER_SITE_OTHER): Payer: Medicare Other | Admitting: Family Medicine

## 2017-07-23 ENCOUNTER — Encounter: Payer: Self-pay | Admitting: Family Medicine

## 2017-07-23 VITALS — BP 138/76 | HR 103 | Temp 97.9°F | Wt 220.8 lb

## 2017-07-23 DIAGNOSIS — R270 Ataxia, unspecified: Secondary | ICD-10-CM | POA: Diagnosis not present

## 2017-07-23 DIAGNOSIS — R42 Dizziness and giddiness: Secondary | ICD-10-CM

## 2017-07-23 DIAGNOSIS — E1100 Type 2 diabetes mellitus with hyperosmolarity without nonketotic hyperglycemic-hyperosmolar coma (NKHHC): Secondary | ICD-10-CM

## 2017-07-23 NOTE — Progress Notes (Signed)
Patient: Patrick Grant Male    DOB: 1955/04/16   62 y.o.   MRN: [id number] Visit Date: 07/23/2017  Today's Provider: Vernie Murders, PA   Chief Complaint  Patient presents with  . Dizziness   Subjective:    Dizziness  This is a recurrent problem. The current episode started more than 1 month ago. The problem occurs daily. The problem has been gradually worsening. Associated symptoms comments: Unsteady gait . The symptoms are aggravated by walking and standing (movement). He has tried nothing for the symptoms.   Past Medical History:  Diagnosis Date  . COPD (chronic obstructive pulmonary disease) ([place])   . CRPS (complex regional pain syndrome)   . Diabetes mellitus without complication ([place])   . Hyperlipidemia   . Hypertension    Patient Active Problem List   Diagnosis Date Noted  . Pulmonary emphysema ([place]) 07/05/2017  . Scrotal pain, Right 04/19/2016  . Prostate cancer screening 04/19/2016  . Cervical pain 02/08/2015  . Cervical nerve root disorder 02/08/2015  . CRPS (complex regional pain syndrome), lower limb 02/08/2015  . Bleeding from the nose 02/08/2015  . Reflex sympathetic dystrophy of lower extremity 02/08/2015  . Type 2 diabetes mellitus ([place]) 02/08/2015  . Combined fat and carbohydrate induced hyperlipemia 04/06/2008  . Elevated fasting blood sugar 10/10/2007  . Essential (primary) hypertension 09/30/2007   Past Surgical History:  Procedure Laterality Date  . SPINAL CORD STIMULATOR REMOVAL  2010   DUE TO LOSS OF MUSCLE USE WHEN ACTIVATED   Family History  Problem Relation Age of Onset  . CVA Mother   . Kidney failure Mother   . Diabetes Mother   . CVA Father   . Kidney failure Father   . Colon cancer Father   . Ovarian cancer Sister   . Diabetes Brother   . Healthy Sister    Allergies  Allergen Reactions  . Almond (Diagnostic) Swelling    throat  . Fentanyl Other (See Comments)    Other reaction(s): Other (See Comments)  Jittery Other Reaction: Other reaction  . Clove Flavor Other (See Comments)    Loss of vision for 1-2 hrs    Current Outpatient Medications:  .  albuterol (PROVENTIL HFA;VENTOLIN HFA) 108 (90 Base) MCG/ACT inhaler, Inhale 2 puffs every 6 (six) hours as needed into the lungs for wheezing or shortness of breath., Disp: 1 Inhaler, Rfl: 2 .  azelastine (ASTELIN) 0.1 % nasal spray, Place 2 (two) times daily into both nostrils. , Disp: , Rfl:  .  empagliflozin (JARDIANCE) 25 MG TABS tablet, Take 25 mg by mouth daily., Disp: 90 tablet, Rfl: 3 .  fluticasone (FLONASE) 50 MCG/ACT nasal spray, Place daily into both nostrils. , Disp: , Rfl:  .  glucose blood (ONE TOUCH ULTRA TEST) test strip, USE ONE STRIP TO CHECK GLUCOSE ONCE DAILY, Disp: 50 each, Rfl: 9 .  lisinopril (PRINIVIL,ZESTRIL) 10 MG tablet, TAKE ONE TABLET BY MOUTH ONCE DAILY, Disp: 90 tablet, Rfl: 3 .  metFORMIN (GLUCOPHAGE) 1000 MG tablet, Take 1 tablet (1,000 mg total) by mouth 2 (two) times daily with a meal., Disp: 180 tablet, Rfl: 3 .  mometasone-formoterol (DULERA) 100-5 MCG/ACT AERO, Inhale 2 puffs into the lungs 2 (two) times daily at 10 AM and 5 PM., Disp: 13 g, Rfl: 3 .  nitroGLYCERIN (NITROSTAT) 0.4 MG SL tablet, Place 1 tablet (0.4 mg total) under the tongue every 5 (five) minutes as needed., Disp: 25 tablet, Rfl: 6 .  pravastatin (PRAVACHOL)  20 MG tablet, TAKE ONE TABLET BY MOUTH ONCE DAILY, Disp: 90 tablet, Rfl: 3 .  tiotropium (SPIRIVA HANDIHALER) 18 MCG inhalation capsule, Place 1 capsule (18 mcg total) into inhaler and inhale daily., Disp: 90 capsule, Rfl: 3  Review of Systems  Constitutional: Negative.   Respiratory: Negative.   Cardiovascular: Negative.   Musculoskeletal: Positive for gait problem.  Neurological: Positive for dizziness.   Social History   Tobacco Use  . Smoking status: Former Smoker    Years: 25.00    Types: Cigars    Last attempt to quit: 03/12/2016    Years since quitting: 1.3  . Smokeless  tobacco: Never Used  Substance Use Topics  . Alcohol use: No    Alcohol/week: 0.0 oz   Objective:   BP 138/76 (BP Location: Right Arm, Patient Position: Sitting, Cuff Size: Normal)   Pulse (!) 103   Temp 97.9 F (36.6 C) (Oral)   Wt 220 lb 12.8 oz (100.2 kg)   SpO2 98%   BMI 29.13 kg/m  Wt Readings from Last 3 Encounters:  07/23/17 220 lb 12.8 oz (100.2 kg)  05/24/17 223 lb 6.4 oz (101.3 kg)  05/13/17 226 lb 3.2 oz (102.6 kg)   BP Readings from Last 3 Encounters:  07/23/17 138/76  05/24/17 (!) 142/76  05/13/17 138/80   Physical Exam  Constitutional: He is oriented to person, place, and time. He appears well-developed and well-nourished. No distress.  HENT:  Head: Normocephalic and atraumatic.  Right Ear: Hearing normal.  Left Ear: Hearing normal.  Nose: Nose normal.  Eyes: Pupils are equal, round, and reactive to light. Conjunctivae, EOM and lids are normal. Right eye exhibits no discharge. Left eye exhibits no discharge. No scleral icterus.  Neck: Normal range of motion. Neck supple.  Cardiovascular: Normal rate, regular rhythm and normal heart sounds.  No murmur heard. Pulmonary/Chest: Effort normal and breath sounds normal. No respiratory distress.  Abdominal: Soft. Bowel sounds are normal.  Musculoskeletal:  Chronic pain in the left foot secondary to CRPS.  Neurological: He is alert and oriented to person, place, and time. Coordination abnormal.  Positive Romberg. No nystagmus or hearing loss.  Skin: Skin is intact. No lesion and no rash noted.  Psychiatric: He has a normal mood and affect. His speech is normal and behavior is normal. Thought content normal.      Assessment & Plan:     1. Dizziness Onset several months ago but seem to be worse the past few days. No fever, congestion, hearing loss, headache, palpitations, chest pains or dyspnea. No nausea, vomiting or diarrhea. Worse when he stands very long. A little better when sitting but feels it nearly  disappears when she lies down. Will get CBC, CMP and CT scan. May use Meclizine (OTC) prn. May need referral to neurologist. - CBC with Differential/Platelet - Comprehensive metabolic panel - CT Head Wo Contrast  2. Ataxia Balance issues with dizziness for the past several months. Has CRPS in the left foot that causes him to use a crutch for walking. Feels this is getting worse the past few days. No falls. "Feels like I am walking on a trampoline." Will check CBC, CMP and CT of head. May need referral to neurologist pending reports. - CBC with Differential/Platelet - Comprehensive metabolic panel - CT Head Wo Contrast  3. Type 2 diabetes mellitus with hyperosmolarity without coma, without long-term current use of insulin (HCC) Tolerating Jardiance 25 mg qd with Metformin 1000 mg BID. FBS was 115 this morning  and denies hypoglycemic episodes. Drinking 8-10 bottles of water a day. Will check CBC and CMP. Last Hgb A1C was 10.8% on 06-05-17. - CBC with Differential/Platelet - Comprehensive metabolic panel       Vernie Murders, PA  [facility]

## 2017-07-23 NOTE — Patient Instructions (Signed)

## 2017-07-24 LAB — CBC WITH DIFFERENTIAL/PLATELET
BASOS: 0 %
Basophils Absolute: 0 10*3/uL (ref 0.0–0.2)
EOS (ABSOLUTE): 0.1 10*3/uL (ref 0.0–0.4)
Eos: 1 %
Hematocrit: 47 % (ref 37.5–51.0)
Hemoglobin: 16.5 g/dL (ref 13.0–17.7)
IMMATURE GRANULOCYTES: 1 %
Immature Grans (Abs): 0.1 10*3/uL (ref 0.0–0.1)
Lymphocytes Absolute: 2.7 10*3/uL (ref 0.7–3.1)
Lymphs: 24 %
MCH: 31 pg (ref 26.6–33.0)
MCHC: 35.1 g/dL (ref 31.5–35.7)
MCV: 88 fL (ref 79–97)
MONOS ABS: 0.7 10*3/uL (ref 0.1–0.9)
Monocytes: 7 %
NEUTROS PCT: 67 %
Neutrophils Absolute: 7.4 10*3/uL — ABNORMAL HIGH (ref 1.4–7.0)
PLATELETS: 369 10*3/uL (ref 150–379)
RBC: 5.32 x10E6/uL (ref 4.14–5.80)
RDW: 13.5 % (ref 12.3–15.4)
WBC: 11 10*3/uL — AB (ref 3.4–10.8)

## 2017-07-24 LAB — COMPREHENSIVE METABOLIC PANEL
A/G RATIO: 2.2 (ref 1.2–2.2)
ALK PHOS: 84 IU/L (ref 39–117)
ALT: 32 IU/L (ref 0–44)
AST: 21 IU/L (ref 0–40)
Albumin: 5.2 g/dL (ref 3.5–5.5)
BILIRUBIN TOTAL: 0.4 mg/dL (ref 0.0–1.2)
BUN/Creatinine Ratio: 15 (ref 9–20)
BUN: 16 mg/dL (ref 6–24)
CALCIUM: 9.9 mg/dL (ref 8.7–10.2)
CHLORIDE: 102 mmol/L (ref 96–106)
CO2: 17 mmol/L — ABNORMAL LOW (ref 20–29)
Creatinine, Ser: 1.07 mg/dL (ref 0.76–1.27)
GFR calc Af Amer: 87 mL/min/{1.73_m2} (ref >59)
GFR, EST NON AFRICAN AMERICAN: 76 mL/min/{1.73_m2} (ref >59)
Globulin, Total: 2.4 g/dL (ref 1.5–4.5)
Glucose: 106 mg/dL — ABNORMAL HIGH (ref 65–99)
POTASSIUM: 5 mmol/L (ref 3.5–5.2)
Sodium: 137 mmol/L (ref 134–144)
Total Protein: 7.6 g/dL (ref 6.0–8.5)

## 2017-07-25 ENCOUNTER — Ambulatory Visit: Payer: Medicare Other | Admitting: Family Medicine

## 2017-07-26 ENCOUNTER — Ambulatory Visit
Admission: RE | Admit: 2017-07-26 | Discharge: 2017-07-26 | Disposition: A | Discharge status: Home / Self Care | Payer: Medicare Other | Source: Ambulatory Visit | Attending: Family Medicine | Admitting: Family Medicine

## 2017-07-26 DIAGNOSIS — R42 Dizziness and giddiness: Secondary | ICD-10-CM | POA: Diagnosis not present

## 2017-07-29 ENCOUNTER — Telehealth: Payer: Self-pay

## 2017-07-29 DIAGNOSIS — R270 Ataxia, unspecified: Secondary | ICD-10-CM

## 2017-07-29 DIAGNOSIS — R42 Dizziness and giddiness: Secondary | ICD-10-CM

## 2017-07-29 NOTE — Telephone Encounter (Signed)
Patient advised. He states he is still experiencing dizziness and ataxia. Neurology referral placed.

## 2017-07-29 NOTE — Telephone Encounter (Signed)
-----   Message from Margo Common, [place] sent at 07/29/2017  1:06 PM EDT ----- No evidence of intracranial abnormality. Schedule neurology referral for dizziness and ataxia if no better with the use of Meclizine.

## 2017-07-30 ENCOUNTER — Other Ambulatory Visit: Payer: Self-pay | Admitting: *Deleted

## 2017-07-30 NOTE — Patient Outreach (Signed)
[facility]) Care Management  07/30/2017  [name] Sep 03, 1955 [id number]  [name] is a 62 y/o male referred to [place] by telephonicTHN RN CM on PCP request.Mr. [name] has a medical history which includes COPD, HTN/ HLD, DM- Type II, chronic pain syndrome, frequent falls, and lower extremity reflex sympathetic dystrophy.Mr. [name] was followed in the community by our nurse case manager and pharmacist and is now referred for health coaching for ongoing follow up of COPD management.   I reached out to Mr. [name] by phone today but was unable to reach him or leave a message.   Plan: We will reach out to Mr. [name] again next week in follow up.    [place] [facility]  [phone]

## 2017-08-05 ENCOUNTER — Other Ambulatory Visit: Payer: Self-pay | Admitting: *Deleted

## 2017-08-05 NOTE — Patient Outreach (Signed)
[facility]) Care Management  08/05/2017  Patrick Grant 27-Mar-1955 [id number]   RN Health Coach Introduction Call   Outreach Attempt:  Successful telephone outreach to patient for introduction call.  HIPAA verified with patient.  RN Health Coach introduced self and role.  Informed patient his normal RN Health Coach will be [employer].  Patient verbally agrees to monthly telephone outreaches.  Plan:  RN Health Coach will make telephone outreach to patient to complete initial telephone assessment within the next 10 business days.   [place] [phone] [email or web address]

## 2017-08-09 ENCOUNTER — Encounter: Payer: Self-pay | Admitting: Family Medicine

## 2017-08-12 ENCOUNTER — Other Ambulatory Visit: Payer: Self-pay | Admitting: *Deleted

## 2017-08-12 NOTE — Patient Outreach (Signed)
Patrick Grant) Care Management  08/12/2017  Patrick Grant [birth year] [id number]   RN Health Coach Initial Assessment   Outreach Attempt:  Outreach attempt #1 to patient for initial telephone assessment. No answer. RN Health Coach left HIPAA compliant voicemail message along with contact information.  Plan:  RN Health Coach will send unsuccessful outreach letter to patient.  RN Health Coach will make another outreach attempt to patient within 10 business days if no return call back from patient.   Patrick Grant [phone] [email or web address]

## 2017-08-15 ENCOUNTER — Encounter: Payer: Self-pay | Admitting: Internal Medicine

## 2017-08-15 ENCOUNTER — Ambulatory Visit (INDEPENDENT_AMBULATORY_CARE_PROVIDER_SITE_OTHER): Payer: Medicare Other | Admitting: Internal Medicine

## 2017-08-15 VITALS — BP 131/88 | HR 94 | Resp 16 | Ht 73.0 in | Wt 227.0 lb

## 2017-08-15 DIAGNOSIS — R42 Dizziness and giddiness: Secondary | ICD-10-CM

## 2017-08-15 DIAGNOSIS — G4733 Obstructive sleep apnea (adult) (pediatric): Secondary | ICD-10-CM

## 2017-08-15 DIAGNOSIS — J449 Chronic obstructive pulmonary disease, unspecified: Secondary | ICD-10-CM | POA: Diagnosis not present

## 2017-08-15 DIAGNOSIS — Z9989 Dependence on other enabling machines and devices: Secondary | ICD-10-CM | POA: Diagnosis not present

## 2017-08-15 NOTE — Progress Notes (Signed)
[facility] [place], [place] [address]  Pulmonary Sleep Medicine   Office Visit Note  Patient Name: Patrick Grant DOB: 1955/05/22 MRN [id number]  Date of Service: 08/15/2017  Complaints/HPI:   Patient is here for follow-up.  Had pulmonary function test on showing FEV1 of 24%.  Right now he states that he has been doing okay as far as his breathing is concerned still has shortness of breath on exertion.  Denies smoking.  Denies having any chest pain or palpitations.  No cough no congestion.  States he is using his inhalers as prescribed.  As far as the sleep apnea is concerned he is using his CPAP and his last compliance showed 100% compliance.  Patient has also noted improvement after using CPAP  ROS  General: (-) fever, (-) chills, (-) night sweats, (-) weakness Skin: (-) rashes, (-) itching,. Eyes: (-) visual changes, (-) redness, (-) itching. Nose and Sinuses: (-) nasal stuffiness or itchiness, (-) postnasal drip, (-) nosebleeds, (-) sinus trouble. Mouth and Throat: (-) sore throat, (-) hoarseness. Neck: (-) swollen glands, (-) enlarged thyroid, (-) neck pain. Respiratory: - cough, (-) bloody sputum, - shortness of breath, - wheezing. Cardiovascular: - ankle swelling, (-) chest pain. Lymphatic: (-) lymph node enlargement. Neurologic: (-) numbness, (-) tingling. Psychiatric: (-) anxiety, (-) depression   Current Medication: Outpatient Encounter Medications as of 08/15/2017  Medication Sig Note  . albuterol (PROVENTIL HFA;VENTOLIN HFA) 108 (90 Base) MCG/ACT inhaler Inhale 2 puffs every 6 (six) hours as needed into the lungs for wheezing or shortness of breath. 04/26/2017: Has been using "several times a week"; reports last used "a couple of days ago"  . azelastine (ASTELIN) 0.1 % nasal spray Place 2 (two) times daily into both nostrils.    . empagliflozin (JARDIANCE) 25 MG TABS tablet Take 25 mg by mouth daily.   . fluticasone (FLONASE) 50 MCG/ACT nasal  spray Place daily into both nostrils.    Marland Kitchen glucose blood (ONE TOUCH ULTRA TEST) test strip USE ONE STRIP TO CHECK GLUCOSE ONCE DAILY   . lisinopril (PRINIVIL,ZESTRIL) 10 MG tablet TAKE ONE TABLET BY MOUTH ONCE DAILY   . metFORMIN (GLUCOPHAGE) 1000 MG tablet Take 1 tablet (1,000 mg total) by mouth 2 (two) times daily with a meal.   . mometasone-formoterol (DULERA) 100-5 MCG/ACT AERO Inhale 2 puffs into the lungs 2 (two) times daily at 10 AM and 5 PM.   . nitroGLYCERIN (NITROSTAT) 0.4 MG SL tablet Place 1 tablet (0.4 mg total) under the tongue every 5 (five) minutes as needed. 06/12/2017: Reports has not needed recently   . pravastatin (PRAVACHOL) 20 MG tablet TAKE ONE TABLET BY MOUTH ONCE DAILY   . tiotropium (SPIRIVA HANDIHALER) 18 MCG inhalation capsule Place 1 capsule (18 mcg total) into inhaler and inhale daily.    No facility-administered encounter medications on file as of 08/15/2017.     Surgical History: Past Surgical History:  Procedure Laterality Date  . SPINAL CORD STIMULATOR REMOVAL  2010   DUE TO LOSS OF MUSCLE USE WHEN ACTIVATED    Medical History: Past Medical History:  Diagnosis Date  . COPD (chronic obstructive pulmonary disease) ([employer])   . CRPS (complex regional pain syndrome)   . Diabetes mellitus without complication ([place])   . Hyperlipidemia   . Hypertension     Family History: Family History  Problem Relation Age of Onset  . CVA Mother   . Kidney failure Mother   . Diabetes Mother   . CVA  Father   . Kidney failure Father   . Colon cancer Father   . Ovarian cancer Sister   . Diabetes Brother   . Healthy Sister     Social History: Social History   Socioeconomic History  . Marital status: Single    Spouse name: Not on file  . Number of children: Not on file  . Years of education: Not on file  . Highest education level: Not on file  Occupational History  . Not on file  Social Needs  . Financial resource strain: Not on file  . Food insecurity:     Worry: Not on file    Inability: Not on file  . Transportation needs:    Medical: Not on file    Non-medical: Not on file  Tobacco Use  . Smoking status: Former Smoker    Years: 25.00    Types: Cigars    Last attempt to quit: 03/12/2016    Years since quitting: 1.4  . Smokeless tobacco: Never Used  Substance and Sexual Activity  . Alcohol use: No    Alcohol/week: 0.0 oz  . Drug use: No  . Sexual activity: Not on file  Lifestyle  . Physical activity:    Days per week: Not on file    Minutes per session: Not on file  . Stress: Not on file  Relationships  . Social connections:    Talks on phone: Not on file    Gets together: Not on file    Attends religious service: Not on file    Active member of club or organization: Not on file    Attends meetings of clubs or organizations: Not on file    Relationship status: Not on file  . Intimate partner violence:    Fear of current or ex partner: Not on file    Emotionally abused: Not on file    Physically abused: Not on file    Forced sexual activity: Not on file  Other Topics Concern  . Not on file  Social History Narrative  . Not on file    Vital Signs: Blood pressure 131/88, pulse 94, resp. rate 16, height 6\' 1"  (1.854 m), weight 227 lb (103 kg), SpO2 97 %.  Examination: General Appearance: The patient is well-developed, well-nourished, and in no distress. Skin: Gross inspection of skin unremarkable. Head: normocephalic, no gross deformities. Eyes: no gross deformities noted. ENT: ears appear grossly normal no exudates. Neck: Supple. No thyromegaly. No LAD. Respiratory: no rhonchi noted at this time. Cardiovascular: Normal S1 and S2 without murmur or rub. Extremities: No cyanosis. pulses are equal. Neurologic: Alert and oriented. No involuntary movements.  LABS: Recent Results (from the past 2160 hour(s))  CBC with Differential/Platelet     Status: None   Collection Time: 06/05/17 10:26 AM  Result Value Ref Range    WBC 7.9 3.4 - 10.8 x10E3/uL   RBC 4.51 4.14 - 5.80 x10E6/uL   Hemoglobin 13.8 13.0 - 17.7 g/dL   Hematocrit 40.9 37.5 - 51.0 %   MCV 91 79 - 97 fL   MCH 30.6 26.6 - 33.0 pg   MCHC 33.7 31.5 - 35.7 g/dL   RDW 12.7 12.3 - 15.4 %   Platelets 280 150 - 379 x10E3/uL   Neutrophils 65 Not Estab. %   Lymphs 26 Not Estab. %   Monocytes 7 Not Estab. %   Eos 2 Not Estab. %   Basos 0 Not Estab. %   Neutrophils Absolute 5.0 1.4 - 7.0  x10E3/uL   Lymphocytes Absolute 2.1 0.7 - 3.1 x10E3/uL   Monocytes Absolute 0.6 0.1 - 0.9 x10E3/uL   EOS (ABSOLUTE) 0.2 0.0 - 0.4 x10E3/uL   Basophils Absolute 0.0 0.0 - 0.2 x10E3/uL   Immature Granulocytes 0 Not Estab. %   Immature Grans (Abs) 0.0 0.0 - 0.1 x10E3/uL  Hemoglobin A1c     Status: Abnormal   Collection Time: 06/05/17 10:26 AM  Result Value Ref Range   Hgb A1c MFr Bld 10.8 (H) 4.8 - 5.6 %    Comment:          Prediabetes: 5.7 - 6.4          Diabetes: >6.4          Glycemic control for adults with diabetes: <7.0    Est. average glucose Bld gHb Est-mCnc 263 mg/dL  Lipid panel     Status: None   Collection Time: 06/05/17 10:27 AM  Result Value Ref Range   Cholesterol, Total 141 100 - 199 mg/dL   Triglycerides 109 0 - 149 mg/dL   HDL 41 >39 mg/dL   VLDL Cholesterol Cal 22 5 - 40 mg/dL   LDL Calculated 78 0 - 99 mg/dL   Chol/HDL Ratio 3.4 0.0 - 5.0 ratio    Comment:                                   T. Chol/HDL Ratio                                             Men  Women                               1/2 Avg.Risk  3.4    3.3                                   Avg.Risk  5.0    4.4                                2X Avg.Risk  9.6    7.1                                3X Avg.Risk 23.4   11.0   Comprehensive metabolic panel     Status: Abnormal   Collection Time: 06/05/17 10:27 AM  Result Value Ref Range   Glucose 231 (H) 65 - 99 mg/dL   BUN 12 6 - 24 mg/dL   Creatinine, Ser 0.92 0.76 - 1.27 mg/dL   GFR calc non Af Amer 91 >59 mL/min/1.73   GFR  calc Af Amer 105 >59 mL/min/1.73   BUN/Creatinine Ratio 13 9 - 20   Sodium 136 134 - 144 mmol/L   Potassium 4.5 3.5 - 5.2 mmol/L   Chloride 100 96 - 106 mmol/L   CO2 20 20 - 29 mmol/L   Calcium 9.2 8.7 - 10.2 mg/dL   Total Protein 6.5 6.0 - 8.5 g/dL   Albumin 4.3 3.5 - 5.5 g/dL   Globulin, Total 2.2 1.5 - 4.5  g/dL   Albumin/Globulin Ratio 2.0 1.2 - 2.2   Bilirubin Total 0.4 0.0 - 1.2 mg/dL   Alkaline Phosphatase 92 39 - 117 IU/L   AST 15 0 - 40 IU/L   ALT 28 0 - 44 IU/L  CBC with Differential/Platelet     Status: Abnormal   Collection Time: 07/23/17  3:37 PM  Result Value Ref Range   WBC 11.0 (H) 3.4 - 10.8 x10E3/uL   RBC 5.32 4.14 - 5.80 x10E6/uL   Hemoglobin 16.5 13.0 - 17.7 g/dL   Hematocrit 47.0 37.5 - 51.0 %   MCV 88 79 - 97 fL   MCH 31.0 26.6 - 33.0 pg   MCHC 35.1 31.5 - 35.7 g/dL   RDW 13.5 12.3 - 15.4 %   Platelets 369 150 - 379 x10E3/uL   Neutrophils 67 Not Estab. %   Lymphs 24 Not Estab. %   Monocytes 7 Not Estab. %   Eos 1 Not Estab. %   Basos 0 Not Estab. %   Neutrophils Absolute 7.4 (H) 1.4 - 7.0 x10E3/uL   Lymphocytes Absolute 2.7 0.7 - 3.1 x10E3/uL   Monocytes Absolute 0.7 0.1 - 0.9 x10E3/uL   EOS (ABSOLUTE) 0.1 0.0 - 0.4 x10E3/uL   Basophils Absolute 0.0 0.0 - 0.2 x10E3/uL   Immature Granulocytes 1 Not Estab. %   Immature Grans (Abs) 0.1 0.0 - 0.1 x10E3/uL  Comprehensive metabolic panel     Status: Abnormal   Collection Time: 07/23/17  3:37 PM  Result Value Ref Range   Glucose 106 (H) 65 - 99 mg/dL   BUN 16 6 - 24 mg/dL   Creatinine, Ser 1.07 0.76 - 1.27 mg/dL   GFR calc non Af Amer 76 >59 mL/min/1.73   GFR calc Af Amer 87 >59 mL/min/1.73   BUN/Creatinine Ratio 15 9 - 20   Sodium 137 134 - 144 mmol/L   Potassium 5.0 3.5 - 5.2 mmol/L   Chloride 102 96 - 106 mmol/L   CO2 17 (L) 20 - 29 mmol/L   Calcium 9.9 8.7 - 10.2 mg/dL   Total Protein 7.6 6.0 - 8.5 g/dL   Albumin 5.2 3.5 - 5.5 g/dL   Globulin, Total 2.4 1.5 - 4.5 g/dL   Albumin/Globulin Ratio  2.2 1.2 - 2.2   Bilirubin Total 0.4 0.0 - 1.2 mg/dL   Alkaline Phosphatase 84 39 - 117 IU/L   AST 21 0 - 40 IU/L   ALT 32 0 - 44 IU/L    Radiology: Ct Head Wo Contrast  Result Date: 07/29/2017 CLINICAL DATA:  Severe dizziness with ataxia for 1 week. EXAM: CT HEAD WITHOUT CONTRAST TECHNIQUE: Contiguous axial images were obtained from the base of the skull through the vertex without intravenous contrast. COMPARISON:  09/08/2013 FINDINGS: Brain: There is no evidence of acute infarct, intracranial hemorrhage, mass, midline shift, or extra-axial fluid collection. The ventricles and sulci are normal. Patchy cerebral white matter hypodensities are greatest in the parietal lobes and have not significantly changed, nonspecific but compatible with moderate chronic small vessel ischemic disease given patient's vascular risk factors. Vascular: No hyperdense vessel. Skull: No fracture or focal osseous lesion. Sinuses/Orbits: Moderate bilateral ethmoid air cell mucosal thickening. No significant mastoid fluid. Unremarkable visualized orbits. Other: None. IMPRESSION: 1. No evidence of acute intracranial abnormality. 2. Moderate chronic small vessel ischemic disease. Electronically Signed   By: Logan Bores M.D.   On: 07/29/2017 08:39    No results found.  Ct Head Wo Contrast  Result Date: 07/29/2017 CLINICAL  DATA:  Severe dizziness with ataxia for 1 week. EXAM: CT HEAD WITHOUT CONTRAST TECHNIQUE: Contiguous axial images were obtained from the base of the skull through the vertex without intravenous contrast. COMPARISON:  09/08/2013 FINDINGS: Brain: There is no evidence of acute infarct, intracranial hemorrhage, mass, midline shift, or extra-axial fluid collection. The ventricles and sulci are normal. Patchy cerebral white matter hypodensities are greatest in the parietal lobes and have not significantly changed, nonspecific but compatible with moderate chronic small vessel ischemic disease given patient's vascular  risk factors. Vascular: No hyperdense vessel. Skull: No fracture or focal osseous lesion. Sinuses/Orbits: Moderate bilateral ethmoid air cell mucosal thickening. No significant mastoid fluid. Unremarkable visualized orbits. Other: None. IMPRESSION: 1. No evidence of acute intracranial abnormality. 2. Moderate chronic small vessel ischemic disease. Electronically Signed   By: Logan Bores M.D.   On: 07/29/2017 08:39      Assessment and Plan: Patient Active Problem List   Diagnosis Date Noted  . Pulmonary emphysema ([place]) 07/05/2017  . Scrotal pain, Right 04/19/2016  . Prostate cancer screening 04/19/2016  . Cervical pain 02/08/2015  . Cervical nerve root disorder 02/08/2015  . CRPS (complex regional pain syndrome), lower limb 02/08/2015  . Bleeding from the nose 02/08/2015  . Reflex sympathetic dystrophy of lower extremity 02/08/2015  . Type 2 diabetes mellitus ([place]) 02/08/2015  . Combined fat and carbohydrate induced hyperlipemia 04/06/2008  . Elevated fasting blood sugar 10/10/2007  . Essential (primary) hypertension 09/30/2007    1. OSA on CPAP device. Last download was excellent with 100% compliance. He will continue with current CPAP pressures 2. COPD severe disease reviewed the PFT with him last FEV1 was 24%. Continue with current regimen 3. Dizziness new complaint. He is to see neurology for workup  General Counseling: I have discussed the findings of the evaluation and examination with [facility].  I have also discussed any further diagnostic evaluation thatmay be needed or ordered today. Kailyn verbalizes understanding of the findings of todays visit. We also reviewed his medications today and discussed drug interactions and side effects including but not limited excessive drowsiness and altered mental states. We also discussed that there is always a risk not just to him but also people around him. he has been encouraged to call the office with any questions or concerns that should  arise related to todays visit.    Time spent: 55min  I have personally obtained a history, examined the patient, evaluated laboratory and imaging results, formulated the assessment and plan and placed orders.    Allyne Gee, MD [facility] Pulmonary and Critical Care Sleep medicine

## 2017-08-15 NOTE — Patient Instructions (Signed)

## 2017-08-21 ENCOUNTER — Ambulatory Visit: Payer: Self-pay | Admitting: *Deleted

## 2017-08-23 ENCOUNTER — Ambulatory Visit: Payer: Self-pay | Admitting: *Deleted

## 2017-08-29 ENCOUNTER — Other Ambulatory Visit: Payer: Self-pay | Admitting: *Deleted

## 2017-08-29 NOTE — Patient Outreach (Signed)
[facility]) Care Management  08/29/2017  Patrick Grant May 13, 1955 [id number]   [place] attempted #2  follow up outreach call to patient.  Patient was unavailable. No  voicemail available for message. Plan: RN will call patient again within 10 business days.  [place] Care Management [phone]

## 2017-09-02 DIAGNOSIS — R42 Dizziness and giddiness: Secondary | ICD-10-CM | POA: Insufficient documentation

## 2017-09-02 DIAGNOSIS — R2689 Other abnormalities of gait and mobility: Secondary | ICD-10-CM | POA: Diagnosis not present

## 2017-09-04 ENCOUNTER — Ambulatory Visit: Payer: Self-pay | Admitting: *Deleted

## 2017-09-09 ENCOUNTER — Encounter: Payer: Self-pay | Admitting: Family Medicine

## 2017-09-23 ENCOUNTER — Encounter: Payer: Self-pay | Admitting: Family Medicine

## 2017-09-24 ENCOUNTER — Encounter: Payer: Self-pay | Admitting: Family Medicine

## 2017-09-24 ENCOUNTER — Ambulatory Visit (INDEPENDENT_AMBULATORY_CARE_PROVIDER_SITE_OTHER): Payer: Medicare Other | Admitting: Family Medicine

## 2017-09-24 VITALS — BP 122/76 | HR 95 | Temp 98.2°F | Wt 231.0 lb

## 2017-09-24 DIAGNOSIS — Z1211 Encounter for screening for malignant neoplasm of colon: Secondary | ICD-10-CM | POA: Diagnosis not present

## 2017-09-24 DIAGNOSIS — L02425 Furuncle of right lower limb: Secondary | ICD-10-CM

## 2017-09-24 MED ORDER — DOXYCYCLINE HYCLATE 100 MG PO TABS: 100.0000 mg | ORAL_TABLET | Freq: Two times a day (BID) | ORAL | 0 refills | Status: DC

## 2017-09-24 NOTE — Patient Instructions (Signed)

## 2017-09-24 NOTE — Progress Notes (Signed)
Patient: Patrick Grant Male    DOB: [birth year]   62 y.o.   MRN: [id number] Visit Date: 09/24/2017  Today's Provider: Vernie Murders, PA   Chief Complaint  Patient presents with  . Cyst   Subjective:    HPI Patient presents today C/O a knot near groin area of right leg. He noticed the knot 2 days ago while taking a shower. Patient states the area is hard and drainage has a foul odor. He reports symptoms are gradually improving. He denies pain.     Past Medical History:  Diagnosis Date  . COPD (chronic obstructive pulmonary disease) ([place])   . CRPS (complex regional pain syndrome)   . Diabetes mellitus without complication ([place])   . Hyperlipidemia   . Hypertension    Past Surgical History:  Procedure Laterality Date  . SPINAL CORD STIMULATOR REMOVAL  2010   DUE TO LOSS OF MUSCLE USE WHEN ACTIVATED   Family History  Problem Relation Age of Onset  . CVA Mother   . Kidney failure Mother   . Diabetes Mother   . CVA Father   . Kidney failure Father   . Colon cancer Father   . Ovarian cancer Sister   . Diabetes Brother   . Healthy Sister    Allergies  Allergen Reactions  . Almond (Diagnostic) Swelling    throat  . Fentanyl Other (See Comments)    Other reaction(s): Other (See Comments) Jittery Other Reaction: Other reaction  . Clove Flavor Other (See Comments)    Loss of vision for 1-2 hrs    Current Outpatient Medications:  .  albuterol (PROVENTIL HFA;VENTOLIN HFA) 108 (90 Base) MCG/ACT inhaler, Inhale 2 puffs every 6 (six) hours as needed into the lungs for wheezing or shortness of breath., Disp: 1 Inhaler, Rfl: 2 .  azelastine (ASTELIN) 0.1 % nasal spray, Place 2 (two) times daily into both nostrils. , Disp: , Rfl:  .  empagliflozin (JARDIANCE) 25 MG TABS tablet, Take 25 mg by mouth daily., Disp: 90 tablet, Rfl: 3 .  fluticasone (FLONASE) 50 MCG/ACT nasal spray, Place daily into both nostrils. , Disp: , Rfl:  .  glucose blood (ONE TOUCH ULTRA TEST) test  strip, USE ONE STRIP TO CHECK GLUCOSE ONCE DAILY, Disp: 50 each, Rfl: 9 .  lisinopril (PRINIVIL,ZESTRIL) 10 MG tablet, TAKE ONE TABLET BY MOUTH ONCE DAILY, Disp: 90 tablet, Rfl: 3 .  metFORMIN (GLUCOPHAGE) 1000 MG tablet, Take 1 tablet (1,000 mg total) by mouth 2 (two) times daily with a meal., Disp: 180 tablet, Rfl: 3 .  mometasone-formoterol (DULERA) 100-5 MCG/ACT AERO, Inhale 2 puffs into the lungs 2 (two) times daily at 10 AM and 5 PM., Disp: 13 g, Rfl: 3 .  nitroGLYCERIN (NITROSTAT) 0.4 MG SL tablet, Place 1 tablet (0.4 mg total) under the tongue every 5 (five) minutes as needed., Disp: 25 tablet, Rfl: 6 .  pravastatin (PRAVACHOL) 20 MG tablet, TAKE ONE TABLET BY MOUTH ONCE DAILY, Disp: 90 tablet, Rfl: 3 .  tiotropium (SPIRIVA HANDIHALER) 18 MCG inhalation capsule, Place 1 capsule (18 mcg total) into inhaler and inhale daily., Disp: 90 capsule, Rfl: 3  Review of Systems  Constitutional: Negative.   Respiratory: Negative.   Cardiovascular: Negative.   Skin:       Possible furuncle    Social History   Tobacco Use  . Smoking status: Former Smoker    Years: 25.00    Types: Cigars    Last attempt to quit: 03/12/2016  Years since quitting: 1.5  . Smokeless tobacco: Never Used  Substance Use Topics  . Alcohol use: No    Alcohol/week: 0.0 oz   Objective:   BP 122/76 (BP Location: Right Arm, Patient Position: Sitting, Cuff Size: Normal)   Pulse 95   Temp 98.2 F (36.8 C) (Oral)   Wt 231 lb (104.8 kg)   SpO2 97%   BMI 30.48 kg/m   Physical Exam  Constitutional: He is oriented to person, place, and time. He appears well-developed and well-nourished. No distress.  HENT:  Head: Normocephalic and atraumatic.  Right Ear: Hearing normal.  Left Ear: Hearing normal.  Nose: Nose normal.  Eyes: Conjunctivae and lids are normal. Right eye exhibits no discharge. Left eye exhibits no discharge. No scleral icterus.  Cardiovascular: Normal rate.  Pulmonary/Chest: Effort normal. No  respiratory distress.  Musculoskeletal: Normal range of motion.  Neurological: He is alert and oriented to person, place, and time.  Skin: Skin is intact. No lesion and no rash noted. There is erythema.  Furuncle right upper inner thigh with purulent drainage. 1.5 cm induration with 3 cm area of erythema surrounding.   Psychiatric: He has a normal mood and affect. His speech is normal and behavior is normal. Thought content normal.      Assessment & Plan:     1. Furuncle of right lower limb Onset of a lump in the right upper inner thigh over the past 3 days. No fever or local lymphadenopathy. Some purulent fluid expressed today and culture obtained. Recommend warm Epsom Saltwater soaks and treated with antibiotic. Recheck pending culture report. - doxycycline (VIBRA-TABS) 100 MG tablet; Take 1 tablet (100 mg total) by mouth 2 (two) times daily.  Dispense: 20 tablet; Refill: 0 - Wound culture  2. Colon cancer screening Asymptomatic. Past due for screening colonoscopy. Postponed in the past due to financial reasons. - Ambulatory referral to Gastroenterology       Vernie Murders, [facility]

## 2017-09-26 LAB — AEROBIC CULTURE

## 2017-10-08 ENCOUNTER — Other Ambulatory Visit: Payer: Self-pay

## 2017-10-08 DIAGNOSIS — Z1211 Encounter for screening for malignant neoplasm of colon: Secondary | ICD-10-CM

## 2017-10-11 ENCOUNTER — Other Ambulatory Visit: Payer: Self-pay

## 2017-10-14 ENCOUNTER — Other Ambulatory Visit: Payer: Self-pay | Admitting: Family Medicine

## 2017-10-14 ENCOUNTER — Encounter: Payer: Self-pay | Admitting: Family Medicine

## 2017-10-14 DIAGNOSIS — J439 Emphysema, unspecified: Secondary | ICD-10-CM

## 2017-10-14 DIAGNOSIS — L02425 Furuncle of right lower limb: Secondary | ICD-10-CM

## 2017-10-14 MED ORDER — ALBUTEROL SULFATE HFA 108 (90 BASE) MCG/ACT IN AERS: 2.0000 | INHALATION_SPRAY | Freq: Four times a day (QID) | RESPIRATORY_TRACT | 2 refills | Status: DC | PRN

## 2017-10-14 MED ORDER — DOXYCYCLINE HYCLATE 100 MG PO TABS: 100.0000 mg | ORAL_TABLET | Freq: Two times a day (BID) | ORAL | 0 refills | Status: DC

## 2017-11-14 ENCOUNTER — Encounter: Payer: Self-pay | Admitting: *Deleted

## 2017-11-21 ENCOUNTER — Ambulatory Visit: Admission: RE | Admit: 2017-11-21 | Payer: Medicare Other | Source: Ambulatory Visit | Admitting: Gastroenterology

## 2017-11-21 ENCOUNTER — Encounter: Admission: RE | Payer: Self-pay | Source: Ambulatory Visit

## 2017-11-21 DIAGNOSIS — Z1211 Encounter for screening for malignant neoplasm of colon: Secondary | ICD-10-CM

## 2017-11-21 SURGERY — COLONOSCOPY WITH PROPOFOL
Anesthesia: General

## 2017-12-02 ENCOUNTER — Other Ambulatory Visit: Payer: Self-pay | Admitting: *Deleted

## 2017-12-02 NOTE — Telephone Encounter (Signed)
This encounter was created in error - please disregard.

## 2017-12-02 NOTE — Patient Outreach (Signed)
[facility]) Care Management  12/02/2017  Patrick Grant [place] 1956-01-27 [id number]  Case Closure: Multiple attempts to establish contact with patient without success. No response from letter mailed to patient.  Case is being closed Closure letter sent to patient and physician.  [place] Care Management [phone]

## 2017-12-26 ENCOUNTER — Ambulatory Visit: Payer: Medicare Other | Admitting: Internal Medicine

## 2017-12-26 ENCOUNTER — Encounter: Payer: Self-pay | Admitting: Internal Medicine

## 2017-12-26 VITALS — BP 156/88 | HR 93 | Resp 16 | Ht 73.0 in | Wt 229.0 lb

## 2017-12-26 DIAGNOSIS — I10 Essential (primary) hypertension: Secondary | ICD-10-CM | POA: Diagnosis not present

## 2017-12-26 DIAGNOSIS — G4733 Obstructive sleep apnea (adult) (pediatric): Secondary | ICD-10-CM | POA: Diagnosis not present

## 2017-12-26 DIAGNOSIS — Z9989 Dependence on other enabling machines and devices: Secondary | ICD-10-CM

## 2017-12-26 DIAGNOSIS — J449 Chronic obstructive pulmonary disease, unspecified: Secondary | ICD-10-CM

## 2017-12-26 DIAGNOSIS — R0602 Shortness of breath: Secondary | ICD-10-CM

## 2017-12-26 NOTE — Progress Notes (Signed)
[facility] [place], [place] [address]  Pulmonary Sleep Medicine   Office Visit Note  Patient Name: Patrick Grant DOB: January 02, 1956 MRN [id number]  Date of Service: 12/26/2017  Complaints/HPI: Pt here for follow up on OSA and COPD.  Pt reports his breathing has been okay.  He has been taking his Dulera and Spiriva inhalers as ordered.  He denies difficulty with that.  He reports his CPAP machine was accidentally left in [place] when a family member died.  When he finally received it back, the filter door was broken.  He is in the process of having it repaired.  He has now been without his CPAP for two months. He reports his breathing has been doing better. Denies recent hospitalizations.    ROS  General: (-) fever, (-) chills, (-) night sweats, (-) weakness Skin: (-) rashes, (-) itching,. Eyes: (-) visual changes, (-) redness, (-) itching. Nose and Sinuses: (-) nasal stuffiness or itchiness, (-) postnasal drip, (-) nosebleeds, (-) sinus trouble. Mouth and Throat: (-) sore throat, (-) hoarseness. Neck: (-) swollen glands, (-) enlarged thyroid, (-) neck pain. Respiratory: - cough, (-) bloody sputum, - shortness of breath, - wheezing. Cardiovascular: - ankle swelling, (-) chest pain. Lymphatic: (-) lymph node enlargement. Neurologic: (-) numbness, (-) tingling. Psychiatric: (-) anxiety, (-) depression   Current Medication: Outpatient Encounter Medications as of 12/26/2017  Medication Sig Note  . azelastine (ASTELIN) 0.1 % nasal spray Place 2 (two) times daily into both nostrils.    Marland Kitchen doxycycline (VIBRA-TABS) 100 MG tablet Take 1 tablet (100 mg total) by mouth 2 (two) times daily.   . empagliflozin (JARDIANCE) 25 MG TABS tablet Take 25 mg by mouth daily.   . fluticasone (FLONASE) 50 MCG/ACT nasal spray Place daily into both nostrils.    Marland Kitchen glucose blood (ONE TOUCH ULTRA TEST) test strip USE ONE STRIP TO CHECK GLUCOSE ONCE DAILY   . lisinopril  (PRINIVIL,ZESTRIL) 10 MG tablet TAKE ONE TABLET BY MOUTH ONCE DAILY   . metFORMIN (GLUCOPHAGE) 1000 MG tablet Take 1 tablet (1,000 mg total) by mouth 2 (two) times daily with a meal.   . mometasone-formoterol (DULERA) 100-5 MCG/ACT AERO Inhale 2 puffs into the lungs 2 (two) times daily at 10 AM and 5 PM.   . nitroGLYCERIN (NITROSTAT) 0.4 MG SL tablet Place 1 tablet (0.4 mg total) under the tongue every 5 (five) minutes as needed. 06/12/2017: Reports has not needed recently   . pravastatin (PRAVACHOL) 20 MG tablet TAKE ONE TABLET BY MOUTH ONCE DAILY   . tiotropium (SPIRIVA HANDIHALER) 18 MCG inhalation capsule Place 1 capsule (18 mcg total) into inhaler and inhale daily.   . VENTOLIN HFA 108 (90 Base) MCG/ACT inhaler INHALE 2 PUFFS BY MOUTH EVERY 6 HOURS ASNEEDED FOR WHEEZING OR SHORTNESS OF BREATH.    No facility-administered encounter medications on file as of 12/26/2017.     Surgical History: Past Surgical History:  Procedure Laterality Date  . SPINAL CORD STIMULATOR REMOVAL  2010   DUE TO LOSS OF MUSCLE USE WHEN ACTIVATED    Medical History: Past Medical History:  Diagnosis Date  . COPD (chronic obstructive pulmonary disease) ([place])   . CRPS (complex regional pain syndrome)   . Diabetes mellitus without complication ([place])   . Hyperlipidemia   . Hypertension     Family History: Family History  Problem Relation Age of Onset  . CVA Mother   . Kidney failure Mother   . Diabetes Mother   . CVA  Father   . Kidney failure Father   . Colon cancer Father   . Ovarian cancer Sister   . Diabetes Brother   . Healthy Sister     Social History: Social History   Socioeconomic History  . Marital status: Single    Spouse name: Not on file  . Number of children: Not on file  . Years of education: Not on file  . Highest education level: Not on file  Occupational History  . Not on file  Social Needs  . Financial resource strain: Not on file  . Food insecurity:    Worry: Not on file     Inability: Not on file  . Transportation needs:    Medical: Not on file    Non-medical: Not on file  Tobacco Use  . Smoking status: Former Smoker    Years: 25.00    Types: Cigars    Last attempt to quit: 03/12/2016    Years since quitting: 1.7  . Smokeless tobacco: Never Used  Substance and Sexual Activity  . Alcohol use: No    Alcohol/week: 0.0 standard drinks  . Drug use: No  . Sexual activity: Not on file  Lifestyle  . Physical activity:    Days per week: Not on file    Minutes per session: Not on file  . Stress: Not on file  Relationships  . Social connections:    Talks on phone: Not on file    Gets together: Not on file    Attends religious service: Not on file    Active member of club or organization: Not on file    Attends meetings of clubs or organizations: Not on file    Relationship status: Not on file  . Intimate partner violence:    Fear of current or ex partner: Not on file    Emotionally abused: Not on file    Physically abused: Not on file    Forced sexual activity: Not on file  Other Topics Concern  . Not on file  Social History Narrative  . Not on file    Vital Signs: Blood pressure (!) 156/88, pulse 93, resp. rate 16, height 6\' 1"  (1.854 m), weight 229 lb (103.9 kg), SpO2 96 %.  Examination: General Appearance: The patient is well-developed, well-nourished, and in no distress. Skin: Gross inspection of skin unremarkable. Head: normocephalic, no gross deformities. Eyes: no gross deformities noted. ENT: ears appear grossly normal no exudates. Neck: Supple. No thyromegaly. No LAD. Respiratory: clear to auscultation bilateraly. Cardiovascular: Normal S1 and S2 without murmur or rub. Extremities: No cyanosis. pulses are equal. Neurologic: Alert and oriented. No involuntary movements.  LABS: No results found for this or any previous visit (from the past 2160 hour(s)).  Radiology: Ct Head Wo Contrast  Result Date: 07/29/2017 CLINICAL DATA:   Severe dizziness with ataxia for 1 week. EXAM: CT HEAD WITHOUT CONTRAST TECHNIQUE: Contiguous axial images were obtained from the base of the skull through the vertex without intravenous contrast. COMPARISON:  09/08/2013 FINDINGS: Brain: There is no evidence of acute infarct, intracranial hemorrhage, mass, midline shift, or extra-axial fluid collection. The ventricles and sulci are normal. Patchy cerebral white matter hypodensities are greatest in the parietal lobes and have not significantly changed, nonspecific but compatible with moderate chronic small vessel ischemic disease given patient's vascular risk factors. Vascular: No hyperdense vessel. Skull: No fracture or focal osseous lesion. Sinuses/Orbits: Moderate bilateral ethmoid air cell mucosal thickening. No significant mastoid fluid. Unremarkable visualized orbits. Other: None. IMPRESSION:  1. No evidence of acute intracranial abnormality. 2. Moderate chronic small vessel ischemic disease. Electronically Signed   By: Logan Bores M.D.   On: 07/29/2017 08:39    No results found.  No results found.    Assessment and Plan: Patient Active Problem List   Diagnosis Date Noted  . Dizziness 09/02/2017  . Pulmonary emphysema ([place]) 07/05/2017  . Scrotal pain, Right 04/19/2016  . Prostate cancer screening 04/19/2016  . Cervical pain 02/08/2015  . Cervical nerve root disorder 02/08/2015  . CRPS (complex regional pain syndrome), lower limb 02/08/2015  . Bleeding from the nose 02/08/2015  . Reflex sympathetic dystrophy of lower extremity 02/08/2015  . Type 2 diabetes mellitus ([place]) 02/08/2015  . Combined fat and carbohydrate induced hyperlipemia 04/06/2008  . Elevated fasting blood sugar 10/10/2007  . Essential (primary) hypertension 09/30/2007   1. OSA on CPAP Encourage patient to contact DME company to discuss repair of machine since it was damaged during transit. Encouraged compliance, and discussed long term effects of OSA untreated.    2.  Chronic obstructive pulmonary disease, unspecified COPD type ([place]) Continue using inhalers. Doing well at this time.   3. SOB (shortness of breath) FEV1 71% FEV1/FVC 92% - Spirometry with Graph  4. Hypertension, unspecified type Elevated today, encouraged patient to follow up with PCP.    General Counseling: I have discussed the findings of the evaluation and examination with Meng.  I have also discussed any further diagnostic evaluation thatmay be needed or ordered today. Keir verbalizes understanding of the findings of todays visit. We also reviewed his medications today and discussed drug interactions and side effects including but not limited excessive drowsiness and altered mental states. We also discussed that there is always a risk not just to him but also people around him. he has been encouraged to call the office with any questions or concerns that should arise related to todays visit.  Time spent: 25 This patient was seen by Orson Gear AGNP-C in Collaboration with Dr. Devona Konig as a part of collaborative care agreement.   I have personally obtained a history, examined the patient, evaluated laboratory and imaging results, formulated the assessment and plan and placed orders.    Allyne Gee, MD [facility] Pulmonary and Critical Care Sleep medicine

## 2018-01-10 ENCOUNTER — Ambulatory Visit (INDEPENDENT_AMBULATORY_CARE_PROVIDER_SITE_OTHER): Payer: Medicare Other | Admitting: Family Medicine

## 2018-01-10 ENCOUNTER — Encounter: Payer: Self-pay | Admitting: Family Medicine

## 2018-01-10 VITALS — BP 108/70 | HR 89 | Temp 98.1°F | Resp 16 | Wt 228.4 lb

## 2018-01-10 DIAGNOSIS — I10 Essential (primary) hypertension: Secondary | ICD-10-CM

## 2018-01-10 DIAGNOSIS — G90522 Complex regional pain syndrome I of left lower limb: Secondary | ICD-10-CM | POA: Diagnosis not present

## 2018-01-10 DIAGNOSIS — Z23 Encounter for immunization: Secondary | ICD-10-CM | POA: Diagnosis not present

## 2018-01-10 DIAGNOSIS — E1100 Type 2 diabetes mellitus with hyperosmolarity without nonketotic hyperglycemic-hyperosmolar coma (NKHHC): Secondary | ICD-10-CM | POA: Diagnosis not present

## 2018-01-10 LAB — POCT GLYCOSYLATED HEMOGLOBIN (HGB A1C): HEMOGLOBIN A1C: 7.3 % — AB (ref 4.0–5.6)

## 2018-01-10 LAB — POCT UA - MICROALBUMIN: MICROALBUMIN (UR) POC: NEGATIVE mg/L

## 2018-01-10 NOTE — Progress Notes (Signed)
Patient: Patrick Grant Male    DOB: [birth year]   62 y.o.   MRN: [id number] Visit Date: 01/10/2018  Today's Provider: Vernie Murders, PA   Chief Complaint  Patient presents with  . Diabetes   Subjective:    HPI      Diabetes Mellitus Type II, Follow-up:   Lab Results  Component Value Date   HGBA1C 10.8 (H) 06/05/2017   HGBA1C 9.3 (H) 02/06/2017   HGBA1C 10.8 11/16/2016    Last seen for diabetes 5 months ago.  Management since then includes none. He reports good compliance with treatment, patient states that he has been off Jardiance for the past two weeks because he ran out of refills. He is not having side effects.  Current symptoms include none and have been stable. Home blood sugar records: average 142  Episodes of hypoglycemia? no   Current Insulin Regimen: n/a Most Recent Eye Exam: >7yrs Weight trend: stable Prior visit with dietician: no Current diet: well balanced Current exercise: none  Pertinent Labs:    Component Value Date/Time   CHOL 141 06/05/2017 1027   TRIG 109 06/05/2017 1027   HDL 41 06/05/2017 1027   LDLCALC 78 06/05/2017 1027   LDLCALC 86 02/06/2017 0940   CREATININE 1.07 07/23/2017 1537   CREATININE 0.92 02/06/2017 0940    Wt Readings from Last 3 Encounters:  01/10/18 228 lb 6.4 oz (103.6 kg)  12/26/17 229 lb (103.9 kg)  09/24/17 231 lb (104.8 kg)    ------------------------------------------------------------------------ Past Medical History:  Diagnosis Date  . COPD (chronic obstructive pulmonary disease) ([place])   . CRPS (complex regional pain syndrome)   . Diabetes mellitus without complication ([place])   . Hyperlipidemia   . Hypertension    Past Surgical History:  Procedure Laterality Date  . SPINAL CORD STIMULATOR REMOVAL  2010   DUE TO LOSS OF MUSCLE USE WHEN ACTIVATED   Family History  Problem Relation Age of Onset  . CVA Mother   . Kidney failure Mother   . Diabetes Mother   . CVA Father   . Kidney  failure Father   . Colon cancer Father   . Ovarian cancer Sister   . Diabetes Brother   . Healthy Sister    Allergies  Allergen Reactions  . Almond (Diagnostic) Swelling    throat  . Fentanyl Other (See Comments)    Other reaction(s): Other (See Comments) Jittery Other Reaction: Other reaction  . Clove Flavor Other (See Comments)    Loss of vision for 1-2 hrs    Current Outpatient Medications:  .  empagliflozin (JARDIANCE) 25 MG TABS tablet, Take 25 mg by mouth daily., Disp: 90 tablet, Rfl: 3 .  glucose blood (ONE TOUCH ULTRA TEST) test strip, USE ONE STRIP TO CHECK GLUCOSE ONCE DAILY, Disp: 50 each, Rfl: 9 .  lisinopril (PRINIVIL,ZESTRIL) 10 MG tablet, TAKE ONE TABLET BY MOUTH ONCE DAILY, Disp: 90 tablet, Rfl: 3 .  metFORMIN (GLUCOPHAGE) 1000 MG tablet, Take 1 tablet (1,000 mg total) by mouth 2 (two) times daily with a meal., Disp: 180 tablet, Rfl: 3 .  mometasone-formoterol (DULERA) 100-5 MCG/ACT AERO, Inhale 2 puffs into the lungs 2 (two) times daily at 10 AM and 5 PM., Disp: 13 g, Rfl: 3 .  nitroGLYCERIN (NITROSTAT) 0.4 MG SL tablet, Place 1 tablet (0.4 mg total) under the tongue every 5 (five) minutes as needed., Disp: 25 tablet, Rfl: 6 .  pravastatin (PRAVACHOL) 20 MG tablet, TAKE ONE TABLET BY  MOUTH ONCE DAILY, Disp: 90 tablet, Rfl: 3 .  tiotropium (SPIRIVA HANDIHALER) 18 MCG inhalation capsule, Place 1 capsule (18 mcg total) into inhaler and inhale daily., Disp: 90 capsule, Rfl: 3 .  VENTOLIN HFA 108 (90 Base) MCG/ACT inhaler, INHALE 2 PUFFS BY MOUTH EVERY 6 HOURS ASNEEDED FOR WHEEZING OR SHORTNESS OF BREATH., Disp: 18 g, Rfl: 1  Review of Systems  Constitutional: Negative.   HENT: Negative.   Respiratory: Negative.   Endocrine: Negative.   Genitourinary: Negative.    Social History   Tobacco Use  . Smoking status: Former Smoker    Years: 25.00    Types: Cigars    Last attempt to quit: 03/12/2016    Years since quitting: 1.8  . Smokeless tobacco: Never Used    Substance Use Topics  . Alcohol use: No    Alcohol/week: 0.0 standard drinks   Objective:   BP 108/70   Pulse 89   Temp 98.1 F (36.7 C) (Oral)   Resp 16   Wt 228 lb 6.4 oz (103.6 kg)   SpO2 97%   BMI 30.13 kg/m  Vitals:   01/10/18 1445  BP: 108/70  Pulse: 89  Resp: 16  Temp: 98.1 F (36.7 C)  TempSrc: Oral  SpO2: 97%  Weight: 228 lb 6.4 oz (103.6 kg)   Physical Exam  Constitutional: He is oriented to person, place, and time. He appears well-developed and well-nourished. No distress.  HENT:  Head: Normocephalic and atraumatic.  Right Ear: Hearing normal.  Left Ear: Hearing normal.  Nose: Nose normal.  Eyes: Conjunctivae and lids are normal. Right eye exhibits no discharge. Left eye exhibits no discharge. No scleral icterus.  Neck: Neck supple.  Cardiovascular: Normal rate and regular rhythm.  Pulmonary/Chest: Effort normal and breath sounds normal. No respiratory distress.  Abdominal: Soft. Bowel sounds are normal.  Musculoskeletal:  Extreme pain in the left foot with shooting pains radiating up to the knee occasionally. Foot sensitive to temperature and barometric pressure changes. Can't tolerate touch or shoes. Pain causes palms and soles of feet to sweat when intense.  Neurological: He is alert and oriented to person, place, and time.  Skin: Skin is intact. No lesion and no rash noted.  Psychiatric: He has a normal mood and affect. His speech is normal and behavior is normal. Thought content normal.     Assessment & Plan:     1. Type 2 diabetes mellitus with hyperosmolarity without coma, without long-term current use of insulin (HCC) FBS 142 this morning at home. Continues to take the Metformin 1000 mg BID, Pravastatin 20 mg qd, Lisinopril 10 mg qd and Jardiance 25 mg qd. Hgb A1C down to 7.3% today. Urine microalbumen negative (was 20 mg/L on 01-25-17). Continue diet plan and recheck routine labs. Follow up pending reports. - POCT glycosylated hemoglobin (Hb  A1C) - POCT UA - Microalbumin - CBC with Differential/Platelet - Comprehensive metabolic panel - Lipid panel  2. Essential (primary) hypertension BP well controlled. Tolerating Lisinopril 10 mg qd without side effects. No chest pains, palpitations or edema. Check labs and follow up pending reports. - CBC with Differential/Platelet - Comprehensive metabolic panel - Lipid panel  3. Complex regional pain syndrome type 1 of left lower extremity Onset in 2008 following a fracture in the left foot. Continues to use crutches to walk and unable to wear shoes due to constant severe pain. Attempt to use narcotic analgesic in the past was unsuccessful and stopped due to fear of addiction. Check follow  up CBC with diff and CMP. - CBC with Differential/Platelet - Comprehensive metabolic panel  4. Need for influenza vaccination - Flu Vaccine QUAD 36+ mos IM       Vernie Murders, PA  [facility]

## 2018-01-11 ENCOUNTER — Ambulatory Visit: Payer: Medicare Other

## 2018-01-17 DIAGNOSIS — G90522 Complex regional pain syndrome I of left lower limb: Secondary | ICD-10-CM | POA: Diagnosis not present

## 2018-01-17 DIAGNOSIS — E1100 Type 2 diabetes mellitus with hyperosmolarity without nonketotic hyperglycemic-hyperosmolar coma (NKHHC): Secondary | ICD-10-CM | POA: Diagnosis not present

## 2018-01-17 DIAGNOSIS — I10 Essential (primary) hypertension: Secondary | ICD-10-CM | POA: Diagnosis not present

## 2018-01-18 LAB — LIPID PANEL
CHOL/HDL RATIO: 3.4 ratio (ref 0.0–5.0)
Cholesterol, Total: 148 mg/dL (ref 100–199)
HDL: 44 mg/dL (ref >39)
LDL Calculated: 82 mg/dL (ref 0–99)
Triglycerides: 112 mg/dL (ref 0–149)
VLDL Cholesterol Cal: 22 mg/dL (ref 5–40)

## 2018-01-18 LAB — CBC WITH DIFFERENTIAL/PLATELET
BASOS ABS: 0 10*3/uL (ref 0.0–0.2)
Basos: 1 %
EOS (ABSOLUTE): 0.2 10*3/uL (ref 0.0–0.4)
Eos: 3 %
Hematocrit: 41.8 % (ref 37.5–51.0)
Hemoglobin: 13.7 g/dL (ref 13.0–17.7)
IMMATURE GRANULOCYTES: 0 %
Immature Grans (Abs): 0 10*3/uL (ref 0.0–0.1)
Lymphocytes Absolute: 2.1 10*3/uL (ref 0.7–3.1)
Lymphs: 25 %
MCH: 29.8 pg (ref 26.6–33.0)
MCHC: 32.8 g/dL (ref 31.5–35.7)
MCV: 91 fL (ref 79–97)
MONOS ABS: 0.5 10*3/uL (ref 0.1–0.9)
Monocytes: 6 %
NEUTROS PCT: 65 %
Neutrophils Absolute: 5.5 10*3/uL (ref 1.4–7.0)
PLATELETS: 339 10*3/uL (ref 150–450)
RBC: 4.59 x10E6/uL (ref 4.14–5.80)
RDW: 12.6 % (ref 12.3–15.4)
WBC: 8.4 10*3/uL (ref 3.4–10.8)

## 2018-01-18 LAB — COMPREHENSIVE METABOLIC PANEL
A/G RATIO: 2.4 — AB (ref 1.2–2.2)
ALT: 26 IU/L (ref 0–44)
AST: 23 IU/L (ref 0–40)
Albumin: 4.6 g/dL (ref 3.6–4.8)
Alkaline Phosphatase: 86 IU/L (ref 39–117)
BUN/Creatinine Ratio: 13 (ref 10–24)
BUN: 14 mg/dL (ref 8–27)
Bilirubin Total: 0.5 mg/dL (ref 0.0–1.2)
CALCIUM: 9.6 mg/dL (ref 8.6–10.2)
CHLORIDE: 101 mmol/L (ref 96–106)
CO2: 18 mmol/L — AB (ref 20–29)
Creatinine, Ser: 1.09 mg/dL (ref 0.76–1.27)
GFR, EST AFRICAN AMERICAN: 85 mL/min/{1.73_m2} (ref >59)
GFR, EST NON AFRICAN AMERICAN: 73 mL/min/{1.73_m2} (ref >59)
GLOBULIN, TOTAL: 1.9 g/dL (ref 1.5–4.5)
Glucose: 178 mg/dL — ABNORMAL HIGH (ref 65–99)
POTASSIUM: 4.8 mmol/L (ref 3.5–5.2)
SODIUM: 138 mmol/L (ref 134–144)
Total Protein: 6.5 g/dL (ref 6.0–8.5)

## 2018-01-27 ENCOUNTER — Telehealth: Payer: Self-pay | Admitting: Family Medicine

## 2018-01-27 NOTE — Telephone Encounter (Signed)
Pt had lab test done 10-25 and has not heard anything back.  He said they put the results in my chart but he does not know how to read them  CB#  [phone]  Thanks  teri

## 2018-01-27 NOTE — Telephone Encounter (Signed)
Blood sugar higher than last check but Hgb A1C is near goal of <7.0 this time. Continue present dosage of the Jardiance (25 mg qd) and Metformin (1000 mg BID). Follow up levels in 3 months.

## 2018-01-28 ENCOUNTER — Other Ambulatory Visit: Payer: Self-pay | Admitting: Family Medicine

## 2018-01-28 DIAGNOSIS — E1100 Type 2 diabetes mellitus with hyperosmolarity without nonketotic hyperglycemic-hyperosmolar coma (NKHHC): Secondary | ICD-10-CM

## 2018-01-28 MED ORDER — EMPAGLIFLOZIN 25 MG PO TABS: 25.0000 mg | ORAL_TABLET | Freq: Every day | ORAL | 3 refills | Status: DC

## 2018-01-28 NOTE — Telephone Encounter (Signed)
Advised pt of rx sent to pharmacy.  dbs

## 2018-01-28 NOTE — Telephone Encounter (Signed)
Sent refill of Jardiance to the [facility].

## 2018-01-28 NOTE — Telephone Encounter (Signed)
Pt advised of lab results and to continue medications.  Pt needs refills on the jardiance, please send to pharmacy [place].  Pt was getting jardiance  From a mail order can you please send in rx.    [id number] rx # [facility]. [id number] last order # he received.

## 2018-02-05 ENCOUNTER — Telehealth: Payer: Self-pay | Admitting: Family Medicine

## 2018-02-10 ENCOUNTER — Other Ambulatory Visit: Payer: Self-pay | Admitting: Family Medicine

## 2018-02-10 ENCOUNTER — Encounter: Payer: Self-pay | Admitting: Pharmacy Technician

## 2018-02-10 ENCOUNTER — Other Ambulatory Visit: Payer: Self-pay | Admitting: Pharmacy Technician

## 2018-02-10 DIAGNOSIS — E1100 Type 2 diabetes mellitus with hyperosmolarity without nonketotic hyperglycemic-hyperosmolar coma (NKHHC): Secondary | ICD-10-CM

## 2018-02-10 MED ORDER — GLUCOSE BLOOD VI STRP: ORAL_STRIP | 4 refills | Status: DC

## 2018-02-10 MED ORDER — EMPAGLIFLOZIN 25 MG PO TABS: 25.0000 mg | ORAL_TABLET | Freq: Every day | ORAL | 3 refills | Status: DC

## 2018-02-10 NOTE — Telephone Encounter (Signed)
Received a fax from [facility] stating that they need a prescription for glucose blood (ONE TOUCH ULTRA TEST) test strip with a diagnosis code to be able to bill medicare.

## 2018-02-10 NOTE — Progress Notes (Signed)
Had to re-send OneTouch Ultra test strips to the correct pharmacy - [facility].

## 2018-02-10 NOTE — Telephone Encounter (Signed)
Done

## 2018-02-10 NOTE — Telephone Encounter (Signed)
Please advise 

## 2018-02-10 NOTE — Patient Outreach (Signed)
[facility]) Care Management  02/10/2018  Patrick Grant May 08, 1955 [id number]  Successful outreach call placed to patient regarding the 2020 patient assistance application process.  Spoke to [name] to see if he would be interested in applying for patient assistance for 2020. Patient stated he would be interested. Informed patient to be expecting the applications in the mail and to pay attention to the starred items as well as the cover sheet indicating what documentation would be needed. Patient verbalized understanding.  Raeghan Demeter P. Hunt Zajicek, [place] Management [phone]

## 2018-02-10 NOTE — Progress Notes (Signed)
Reprinted Jardiance prescription to fax to the pharmaceutical company for cost assistance.

## 2018-02-11 ENCOUNTER — Other Ambulatory Visit: Payer: Self-pay | Admitting: Family Medicine

## 2018-02-11 MED ORDER — ALBUTEROL SULFATE HFA 108 (90 BASE) MCG/ACT IN AERS: 2.0000 | INHALATION_SPRAY | Freq: Four times a day (QID) | RESPIRATORY_TRACT | 3 refills | Status: DC | PRN

## 2018-02-12 ENCOUNTER — Other Ambulatory Visit: Payer: Self-pay | Admitting: Pharmacy Technician

## 2018-02-12 ENCOUNTER — Encounter: Payer: Self-pay | Admitting: Pharmacy Technician

## 2018-02-12 NOTE — Patient Outreach (Signed)
[facility]) Care Management  02/12/2018  SWANSON FARNELL 04-Oct-1955 [id number]    Received referral for 2020 patient assistance for [employer] for [place] and for [employer] for [place].  Prepared patient portion to be mailed. Faxed B-I portion to Dr. Vernie Murders and mailed Merck portion to Dr. Vernie Murders (per [employer]'s protocol).  Will followup with patient in 7-10 business days to confirm receipt of applications.  Allannah Kempen P. Quinnie Barcelo, [place] Management [phone]

## 2018-02-13 ENCOUNTER — Ambulatory Visit (INDEPENDENT_AMBULATORY_CARE_PROVIDER_SITE_OTHER): Payer: Medicare Other

## 2018-02-13 VITALS — BP 124/70 | HR 95 | Temp 97.8°F | Ht 73.0 in | Wt 228.2 lb

## 2018-02-13 DIAGNOSIS — Z0000 Encounter for general adult medical examination without abnormal findings: Secondary | ICD-10-CM | POA: Diagnosis not present

## 2018-02-13 NOTE — Patient Instructions (Addendum)
Patrick Grant , Thank you for taking time to come for your Medicare Wellness Visit. I appreciate your ongoing commitment to your health goals. Please review the following plan we discussed and let me know if I can assist you in the future.   Screening recommendations/referrals: Colonoscopy: Pt declines today.  Recommended yearly ophthalmology/optometry visit for glaucoma screening and checkup Recommended yearly dental visit for hygiene and checkup  Vaccinations: Influenza vaccine: Up to date Pneumococcal vaccine: Not required until age 54. Tdap vaccine: Pt declines today.  Shingles vaccine: Pt declines today.     Advanced directives: Advance directive discussed with you today. I have provided a copy for you to complete at home and have notarized. Once this is complete please bring a copy in to our office so we can scan it into your chart.  Conditions/risks identified: Obesity; Fall risk prevention.  Next appointment: 05/11/18 with [name] Chrismon.   Preventive Care 63 Years and Older, Male Preventive care refers to lifestyle choices and visits with your health care provider that can promote health and wellness. What does preventive care include?  A yearly physical exam. This is also called an annual well check.  Dental exams once or twice a year.  Routine eye exams. Ask your health care provider how often you should have your eyes checked.  Personal lifestyle choices, including:  Daily care of your teeth and gums.  Regular physical activity.  Eating a healthy diet.  Avoiding tobacco and drug use.  Limiting alcohol use.  Practicing safe sex.  Taking low doses of aspirin every day.  Taking vitamin and mineral supplements as recommended by your health care provider. What happens during an annual well check? The services and screenings done by your health care provider during your annual well check will depend on your age, overall health, lifestyle risk factors, and family  history of disease. Counseling  Your health care provider may ask you questions about your:  Alcohol use.  Tobacco use.  Drug use.  Emotional well-being.  Home and relationship well-being.  Sexual activity.  Eating habits.  History of falls.  Memory and ability to understand (cognition).  Work and work Statistician. Screening  You may have the following tests or measurements:  Height, weight, and BMI.  Blood pressure.  Lipid and cholesterol levels. These may be checked every 5 years, or more frequently if you are over 42 years old.  Skin check.  Lung cancer screening. You may have this screening every year starting at age 60 if you have a 30-pack-year history of smoking and currently smoke or have quit within the past 15 years.  Fecal occult blood test (FOBT) of the stool. You may have this test every year starting at age 5.  Flexible sigmoidoscopy or colonoscopy. You may have a sigmoidoscopy every 5 years or a colonoscopy every 10 years starting at age 32.  Prostate cancer screening. Recommendations will vary depending on your family history and other risks.  Hepatitis C blood test.  Hepatitis B blood test.  Sexually transmitted disease (STD) testing.  Diabetes screening. This is done by checking your blood sugar (glucose) after you have not eaten for a while (fasting). You may have this done every 1-3 years.  Abdominal aortic aneurysm (AAA) screening. You may need this if you are a current or former smoker.  Osteoporosis. You may be screened starting at age 55 if you are at high risk. Talk with your health care provider about your test results, treatment options, and if necessary,  the need for more tests. Vaccines  Your health care provider may recommend certain vaccines, such as:  Influenza vaccine. This is recommended every year.  Tetanus, diphtheria, and acellular pertussis (Tdap, Td) vaccine. You may need a Td booster every 10 years.  Zoster vaccine.  You may need this after age 84.  Pneumococcal 13-valent conjugate (PCV13) vaccine. One dose is recommended after age 69.  Pneumococcal polysaccharide (PPSV23) vaccine. One dose is recommended after age 97. Talk to your health care provider about which screenings and vaccines you need and how often you need them. This information is not intended to replace advice given to you by your health care provider. Make sure you discuss any questions you have with your health care provider. Document Released: 04/08/2015 Document Revised: 11/30/2015 Document Reviewed: 01/11/2015 Elsevier Interactive Patient Education  2017 [place] Prevention in the Home Falls can cause injuries. They can happen to people of all ages. There are many things you can do to make your home safe and to help prevent falls. What can I do on the outside of my home?  Regularly fix the edges of walkways and driveways and fix any cracks.  Remove anything that might make you trip as you walk through a door, such as a raised step or threshold.  Trim any bushes or trees on the path to your home.  Use bright outdoor lighting.  Clear any walking paths of anything that might make someone trip, such as rocks or tools.  Regularly check to see if handrails are loose or broken. Make sure that both sides of any steps have handrails.  Any raised decks and porches should have guardrails on the edges.  Have any leaves, snow, or ice cleared regularly.  Use sand or salt on walking paths during winter.  Clean up any spills in your garage right away. This includes oil or grease spills. What can I do in the bathroom?  Use night lights.  Install grab bars by the toilet and in the tub and shower. Do not use towel bars as grab bars.  Use non-skid mats or decals in the tub or shower.  If you need to sit down in the shower, use a plastic, non-slip stool.  Keep the floor dry. Clean up any water that spills on the floor as soon  as it happens.  Remove soap buildup in the tub or shower regularly.  Attach bath mats securely with double-sided non-slip rug tape.  Do not have throw rugs and other things on the floor that can make you trip. What can I do in the bedroom?  Use night lights.  Make sure that you have a light by your bed that is easy to reach.  Do not use any sheets or blankets that are too big for your bed. They should not hang down onto the floor.  Have a firm chair that has side arms. You can use this for support while you get dressed.  Do not have throw rugs and other things on the floor that can make you trip. What can I do in the kitchen?  Clean up any spills right away.  Avoid walking on wet floors.  Keep items that you use a lot in easy-to-reach places.  If you need to reach something above you, use a strong step stool that has a grab bar.  Keep electrical cords out of the way.  Do not use floor polish or wax that makes floors slippery. If you must use  wax, use non-skid floor wax.  Do not have throw rugs and other things on the floor that can make you trip. What can I do with my stairs?  Do not leave any items on the stairs.  Make sure that there are handrails on both sides of the stairs and use them. Fix handrails that are broken or loose. Make sure that handrails are as long as the stairways.  Check any carpeting to make sure that it is firmly attached to the stairs. Fix any carpet that is loose or worn.  Avoid having throw rugs at the top or bottom of the stairs. If you do have throw rugs, attach them to the floor with carpet tape.  Make sure that you have a light switch at the top of the stairs and the bottom of the stairs. If you do not have them, ask someone to add them for you. What else can I do to help prevent falls?  Wear shoes that:  Do not have high heels.  Have rubber bottoms.  Are comfortable and fit you well.  Are closed at the toe. Do not wear sandals.  If  you use a stepladder:  Make sure that it is fully opened. Do not climb a closed stepladder.  Make sure that both sides of the stepladder are locked into place.  Ask someone to hold it for you, if possible.  Clearly mark and make sure that you can see:  Any grab bars or handrails.  First and last steps.  Where the edge of each step is.  Use tools that help you move around (mobility aids) if they are needed. These include:  Canes.  Walkers.  Scooters.  Crutches.  Turn on the lights when you go into a dark area. Replace any light bulbs as soon as they burn out.  Set up your furniture so you have a clear path. Avoid moving your furniture around.  If any of your floors are uneven, fix them.  If there are any pets around you, be aware of where they are.  Review your medicines with your doctor. Some medicines can make you feel dizzy. This can increase your chance of falling. Ask your doctor what other things that you can do to help prevent falls. This information is not intended to replace advice given to you by your health care provider. Make sure you discuss any questions you have with your health care provider. Document Released: 01/06/2009 Document Revised: 08/18/2015 Document Reviewed: 04/16/2014 Elsevier Interactive Patient Education  2017 [employer].

## 2018-02-13 NOTE — Progress Notes (Addendum)
Subjective:   Patrick Grant is a 62 y.o. male who presents for Medicare Annual/Subsequent preventive examination.  Review of Systems:  N/A  Cardiac Risk Factors include: advanced age (>80men, >64 women);diabetes mellitus;dyslipidemia;hypertension;male gender;obesity (BMI >30kg/m2)     Objective:    Vitals: BP 124/70 (BP Location: Right Arm)   Pulse 95   Temp 97.8 F (36.6 C) (Oral)   Ht 6\' 1"  (1.854 m)   Wt 228 lb 3.2 oz (103.5 kg)   BMI 30.11 kg/m   Body mass index is 30.11 kg/m.  Advanced Directives 02/13/2018 05/13/2017 04/17/2017 02/06/2017 05/23/2016 04/29/2016 04/21/2016  Does Patient Have a Medical Advance Directive? No Yes No No No No No  Type of Advance Directive - Healthcare Power of [place];Living will - - - - -  Copy of [place] in Chart? - No - copy requested - - - - -  Would patient like information on creating a medical advance directive? Yes (MAU/Ambulatory/Procedural Areas - Information given) - Yes (MAU/Ambulatory/Procedural Areas - Information given) Yes (ED - Information included in AVS) No - Patient declined No - Patient declined No - Patient declined    Tobacco Social History   Tobacco Use  Smoking Status Former Smoker  . Years: 25.00  . Types: Cigars  . Last attempt to quit: 03/12/2016  . Years since quitting: 1.9  Smokeless Tobacco Never Used     Counseling given: Not Answered   Clinical Intake:  Pre-visit preparation completed: Yes  Pain : No/denies pain Pain Score: 0-No pain(Has chronic left leg pain. )    Diabetes:  Is the patient diabetic?  Yes , type 2  If diabetic, was a CBG obtained today?  No  Did the patient bring in their glucometer from home?  No  How often do you monitor your CBG's? Twice daily.   Financial Strains and Diabetes Management:  Are you having any financial strains with the device, your supplies or your medication? No .  Does the patient want to be seen by Chronic Care Management for  management of their diabetes?  No  Would the patient like to be referred to a Nutritionist or for Diabetic Management?  No , pt has seen a nutritionist and diabetic educator in the past.   Diabetic Exams:  Diabetic Eye Exam: Completed years ago per patient. Overdue for diabetic eye exam. Pt has been advised about the importance in completing this exam. Pt to set up an eye exam with [facility] by the end of 2019 or beginning of 2020.   Diabetic Foot Exam: Completed 07/24/16. Pt has been advised about the importance in completing this exam. Note made to f/u on this at next OV.    Nutritional Status: BMI > 30  Obese Nutritional Risks: None   How often do you need to have someone help you when you read instructions, pamphlets, or other written materials from your doctor or pharmacy?: 1 - Never  Interpreter Needed?: No  Information entered by :: [facility], LPN  Past Medical History:  Diagnosis Date  . COPD (chronic obstructive pulmonary disease) ([place])   . CRPS (complex regional pain syndrome)   . Diabetes mellitus without complication ([place])   . Hyperlipidemia   . Hypertension    Past Surgical History:  Procedure Laterality Date  . SPINAL CORD STIMULATOR REMOVAL  2010   DUE TO LOSS OF MUSCLE USE WHEN ACTIVATED   Family History  Problem Relation Age of Onset  . CVA Mother   .  Kidney failure Mother   . Diabetes Mother   . CVA Father   . Kidney failure Father   . Colon cancer Father   . Ovarian cancer Sister   . Diabetes Brother   . Healthy Sister    Social History   Socioeconomic History  . Marital status: Single    Spouse name: Not on file  . Number of children: 0  . Years of education: Not on file  . Highest education level: Some college, no degree  Occupational History  . Occupation: disability  Social Needs  . Financial resource strain: Not hard at all  . Food insecurity:    Worry: Never true    Inability: Never true  . Transportation needs:    Medical: No     Non-medical: No  Tobacco Use  . Smoking status: Former Smoker    Years: 25.00    Types: Cigars    Last attempt to quit: 03/12/2016    Years since quitting: 1.9  . Smokeless tobacco: Never Used  Substance and Sexual Activity  . Alcohol use: No    Alcohol/week: 0.0 standard drinks  . Drug use: No  . Sexual activity: Not on file  Lifestyle  . Physical activity:    Days per week: 0 days    Minutes per session: 0 min  . Stress: To some extent  Relationships  . Social connections:    Talks on phone: Patient refused    Gets together: Patient refused    Attends religious service: Patient refused    Active member of club or organization: Patient refused    Attends meetings of clubs or organizations: Patient refused    Relationship status: Patient refused  Other Topics Concern  . Not on file  Social History Narrative  . Not on file    Outpatient Encounter Medications as of 02/13/2018  Medication Sig  . albuterol (PROVENTIL HFA;VENTOLIN HFA) 108 (90 Base) MCG/ACT inhaler Inhale 2 puffs into the lungs every 6 (six) hours as needed for wheezing or shortness of breath.  . empagliflozin (JARDIANCE) 25 MG TABS tablet Take 25 mg by mouth daily.  Marland Kitchen glucose blood (ONE TOUCH ULTRA TEST) test strip USE 1 STRIP TO CHECK GLUCOSE TWICE DAILY *BEFORE  BREAKFAST  AND  SUPPER*  . lisinopril (PRINIVIL,ZESTRIL) 10 MG tablet TAKE ONE TABLET BY MOUTH ONCE DAILY  . metFORMIN (GLUCOPHAGE) 1000 MG tablet Take 1 tablet (1,000 mg total) by mouth 2 (two) times daily with a meal.  . mometasone-formoterol (DULERA) 100-5 MCG/ACT AERO Inhale 2 puffs into the lungs 2 (two) times daily at 10 AM and 5 PM.  . nitroGLYCERIN (NITROSTAT) 0.4 MG SL tablet Place 1 tablet (0.4 mg total) under the tongue every 5 (five) minutes as needed.  . pravastatin (PRAVACHOL) 20 MG tablet TAKE ONE TABLET BY MOUTH ONCE DAILY  . tiotropium (SPIRIVA HANDIHALER) 18 MCG inhalation capsule Place 1 capsule (18 mcg total) into inhaler and inhale  daily.   No facility-administered encounter medications on file as of 02/13/2018.     Activities of Daily Living In your present state of health, do you have any difficulty performing the following activities: 02/13/2018 04/26/2017  Hearing? Y N  Comment Does not wear hearing aids.  -  Vision? Y Y  Comment Has vision loss in the left eye. Pt to f/u with [facility] for an eye exam. Wears readers as needed. -  Difficulty concentrating or making decisions? Y N  Walking or climbing stairs? Tempie Donning  Comment Due  to chronic pain issues.  -  Dressing or bathing? N N  Doing errands, shopping? Y Y  Comment Does not drive.  -  Preparing Food and eating ? N N  Using the Toilet? N N  In the past six months, have you accidently leaked urine? N N  Do you have problems with loss of bowel control? N N  Managing your Medications? N N  Managing your Finances? Y N  Comment Girlfriend manages bills.  -  Housekeeping or managing your Housekeeping? N Y  Comment - States Roommate assists  Some recent data might be hidden    Patient Care Team: Chrismon, Vickki Muff, PA as PCP - General (Family Medicine) Allyne Gee, MD as Consulting Physician (Internal Medicine) Simcox, Luiz Ochoa, CPhT as [place] Management (Pharmacy Technician)   Assessment:   This is a routine wellness examination for [place].  Exercise Activities and Dietary recommendations Current Exercise Habits: The patient does not participate in regular exercise at present, Exercise limited by: orthopedic condition(s)  Goals    . Increase water intake     Starting 01/31/16, I will continue to drink 6-8 glasses a day.     . Prevent Falls     Recommend placing hand rails and grab bars in and outside home to prevent further falls.        Fall Risk Fall Risk  02/13/2018 08/15/2017 07/12/2017 06/12/2017 05/24/2017  Falls in the past year? 1 No (No Data) (No Data) Yes  Comment - - Patient denies new/ recent falls Denies new/ recent falls  today Reports "new" fall last week; states "slipped off porch due to wet steps"  Number falls in past yr: 1 - - - 2 or more  Injury with Fall? 0 - - - No  Comment - - - - -  Risk Factor Category  - - - - High Fall Risk  Risk for fall due to : Impaired balance/gait;Impaired vision - - History of fall(s);Impaired balance/gait;Impaired mobility;Impaired vision Impaired balance/gait;History of fall(s);Impaired vision;Impaired mobility  Risk for fall due to: Comment - - - - -  Follow up - - Falls prevention discussed Falls prevention discussed Falls prevention discussed;Education provided   FALL RISK PREVENTION PERTAINING TO THE HOME:  Any stairs in or around the home WITH handrails? No  Home free of loose throw rugs in walkways, pet beds, electrical cords, etc? Yes  Adequate lighting in your home to reduce risk of falls? Yes   ASSISTIVE DEVICES UTILIZED TO PREVENT FALLS:  Life alert? No  Use of a cane, walker or w/c? Yes , currently using crutches and has a wheelchair. Uses PRN.  Grab bars in the bathroom? Yes  Shower chair or bench in shower? No  Elevated toilet seat or a handicapped toilet? No    TIMED UP AND GO:  Was the test performed? No .    Depression Screen PHQ 2/9 Scores 02/13/2018 08/15/2017 06/12/2017 05/13/2017  PHQ - 2 Score 0 0 1 2  PHQ- 9 Score - - - 7    Cognitive Function: Pt declined today.      6CIT Screen 01/31/2016  What Year? 0 points  What month? 3 points  What time? 0 points  Count back from 20 0 points  Months in reverse 4 points  Repeat phrase 6 points  Total Score 13    Immunization History  Administered Date(s) Administered  . Influenza,inj,Quad PF,6+ Mos 01/01/2014, 02/05/2015, 01/31/2016, 01/09/2017, 01/10/2018  . Pneumococcal Polysaccharide-23 01/09/2017  Qualifies for Shingles Vaccine? Yes . Due for Shingrix. Education has been provided regarding the importance of this vaccine. Pt has been advised to call insurance company to determine  out of pocket expense. Advised may also receive vaccine at local pharmacy or Health Dept. Verbalized acceptance and understanding.  Tdap: Although this vaccine is not a covered service during a Wellness Exam, does the patient still wish to receive this vaccine today?  No .  Education has been provided regarding the importance of this vaccine. Advised may receive this vaccine at local pharmacy or Health Dept. Aware to provide a copy of the vaccination record if obtained from local pharmacy or Health Dept. Verbalized acceptance and understanding.  Flu Vaccine: Up to date    Screening Tests Health Maintenance  Topic Date Due  . OPHTHALMOLOGY EXAM  11/30/1967  . COLONOSCOPY  11/30/2007  . FOOT EXAM  07/24/2017  . TETANUS/TDAP  02/21/2018 (Originally 11/29/1976)  . HEMOGLOBIN A1C  07/12/2018  . INFLUENZA VACCINE  Completed  . PNEUMOCOCCAL POLYSACCHARIDE VACCINE AGE 71-64 HIGH RISK  Completed  . Hepatitis C Screening  Completed  . HIV Screening  Completed   Cancer Screenings:  Colorectal Screening: Pt declines referral today.   Lung Cancer Screening: (Low Dose CT Chest recommended if Age 42-80 years, 30 pack-year currently smoking OR have quit w/in 15years.) does not qualify.    Additional Screening:  Hepatitis C Screening: Up to date  Vision Screening: Recommended annual ophthalmology exams for early detection of glaucoma and other disorders of the eye.  Dental Screening: Recommended annual dental exams for proper oral hygiene  Community Resource Referral:  CRR required this visit?  No        Plan:  I have personally reviewed and addressed the Medicare Annual Wellness questionnaire and have noted the following in the patient's chart:  A. Medical and social history B. Use of alcohol, tobacco or illicit drugs  C. Current medications and supplements D. Functional ability and status E.  Nutritional status F.  Physical activity G. Advance directives H. List of other  physicians I.  Hospitalizations, surgeries, and ER visits in previous 12 months J.  [place] such as hearing and vision if needed, cognitive and depression L. Referrals and appointments - none  In addition, I have reviewed and discussed with patient certain preventive protocols, quality metrics, and best practice recommendations. A written personalized care plan for preventive services as well as general preventive health recommendations were provided to patient.  See attached scanned questionnaire for additional information.   Signed,  Fabio Neighbors, LPN Nurse Health Advisor   Nurse Recommendations: Pt needs a diabetic foot exam at next OV. Pt declined the tetanus vaccine and colonoscopy referral today. Pt states he plans to set up an eye exam for the end of this year or beginning of next year at [facility].  Reviewed documentation and recommendations of the Nurse Health Advisor. Was available for consultation during screening. Patient has Complex Regional Pain Syndrome of the left foot and cannot tolerate the lightest tough. Will complete right foot exam at next office visit. Agree with note and plan.

## 2018-02-24 ENCOUNTER — Other Ambulatory Visit: Payer: Self-pay | Admitting: Pharmacy Technician

## 2018-02-24 NOTE — Patient Outreach (Signed)
[facility]) Care Management  02/24/2018  Patrick Grant 1955-12-07 [id number]   Successful outgoing call placed to patient regarding his medication assistance applications for Spiriva, Jardiance, Dulera & Proventil through [employer] respectively.  Spoke to [name], HIPAA identifiers verified, who said that he has not yet received his applications in the mail.  Will followup in 3-5 business days to inquire if the applications have been delivered.  Ileah Falkenstein P. Jaevian Shean, [place] Management [phone]

## 2018-02-28 ENCOUNTER — Other Ambulatory Visit: Payer: Self-pay | Admitting: Pharmacy Technician

## 2018-02-28 NOTE — Patient Outreach (Signed)
[facility]) Care Management  02/28/2018  Tarin Navarez Wingard Jan 03, 1956 [id number]   Successful call placed to patient in regards to his patient assistance application.  Mr. Dutter answered the phone, HIPAA identifiers verified. Patient informed me that he had not received his applications yet but said the mail [place]. Informed Mr. Yannuzzi that I would check back in 1-2 business days and if the applications had not arrived then I would mail them out again. Patient verbalized understanding.  Will followup with patient in 1-2 business days to inquire if applications were received.  Jeziah Kretschmer P. Mandrell Vangilder, [place] Management [phone]

## 2018-03-03 ENCOUNTER — Other Ambulatory Visit: Payer: Self-pay | Admitting: Pharmacy Technician

## 2018-03-03 NOTE — Patient Outreach (Signed)
[facility]) Care Management  03/03/2018  Gokul Waybright Lafalce 1955-06-15 [id number]    Successful outreach call placed to patient, HIPAA identifiers verified.   Patient stats that he has not received the application for [place] through [employer] and [place] through [employer] that were mailed out on 02/12/2018.  Informed Mr. Brannigan that I would remail the applications back out.  Address was confirmed.  Will followup in 5-7 business days to confirm application has been received.  Meagan Ancona P. Nyara Capell, [place] Management [phone]

## 2018-03-05 ENCOUNTER — Encounter: Payer: Self-pay | Admitting: Family Medicine

## 2018-03-05 ENCOUNTER — Other Ambulatory Visit: Payer: Self-pay | Admitting: Family Medicine

## 2018-03-05 DIAGNOSIS — E1100 Type 2 diabetes mellitus with hyperosmolarity without nonketotic hyperglycemic-hyperosmolar coma (NKHHC): Secondary | ICD-10-CM

## 2018-03-06 ENCOUNTER — Telehealth: Payer: Self-pay | Admitting: Family Medicine

## 2018-03-06 NOTE — Telephone Encounter (Signed)
Pt waiting on refills for:  lisinopril (PRINIVIL,ZESTRIL) 10 MG tablet pravastatin (PRAVACHOL) 20 MG tablet metFORMIN (GLUCOPHAGE) 1000 MG   Please fill at:  [facility], [place] (Phone) [phone] (Fax)    Thanks, [employer]

## 2018-03-06 NOTE — Telephone Encounter (Signed)
Difficulty with electronic prescribing today. Will keep trying to get this in to your pharmacy.

## 2018-03-12 ENCOUNTER — Other Ambulatory Visit: Payer: Self-pay | Admitting: Pharmacy Technician

## 2018-03-12 NOTE — Patient Outreach (Signed)
[place] [facility]) Care Management  03/12/2018  Patrick Grant 11/05/1955 [id number]   Unsuccessful outreach call placed to patient in regards to his Merck & B-I patient assistance applications for [place].  Was inquiring to see if he had received the 2nd set of applications that was mailed out But unfortunately [name] did not answer the phone and a message could not be left b/c the phone line was never picked up after about 15-20 rings.  Will followup with 2nd phone call in 3-5 business days.  Sheree Lalla P. Laurella Tull, [place] Management [phone]

## 2018-03-17 ENCOUNTER — Other Ambulatory Visit: Payer: Self-pay | Admitting: Pharmacy Technician

## 2018-03-17 NOTE — Patient Outreach (Signed)
[place] [facility]) Care Management  03/17/2018  Agapito Hanway Siddiqi 1955/12/16 [id number]   Second unsuccessful outreach attempt to patient in regards to his multiple patient assistance applications for Jardiance &Spiriva with B-I and [place] & Proventil with Merck.  Unfortunately patient did not answer the phone to inquire if he had received the 2nd set of applications that were mailed out to him. HIPPA compliant voicemail left.  Will followup with 3rd call attempt in 3-5 business days if the call is not returned.  Sheamus Hasting P. Peace Noyes, [place] Management [phone]

## 2018-03-24 ENCOUNTER — Other Ambulatory Visit: Payer: Self-pay | Admitting: Pharmacy Technician

## 2018-03-24 NOTE — Patient Outreach (Signed)
[place] [facility]) Care Management  03/24/2018  Patrick Grant September 20, 1955 [id number]    Unsuccessful 3rd all atttempt placed to patient in regards to her BI patient assistance applications for Spiriva and [place] patient assistance applications of Dulera & Proventil.  Called patient to inquire if he had received the 2nd set of applications that were mailed to him. Unfortunately patient did not answer the phone. HIPPA compliant voicemail left.  Will route note to [place] for case closure due to lack of engagement from patient.  Athalia Setterlund P. Giamarie Bueche, [place] Management [phone]

## 2018-03-25 ENCOUNTER — Other Ambulatory Visit: Payer: Self-pay | Admitting: Pharmacist

## 2018-03-25 NOTE — Patient Outreach (Signed)
[place] [facility]) Care Management  03/25/2018  Khale Nigh Stumph October 04, 1955 [id number]  Sharee Pimple Simcox, CPhT placed unsuccessful calls w/ voicemails to patient on 12/18, 12/23, and 12/31 regarding medication assistance reapplication.   Will close case d/t lack of patient engagement.   Catie Darnelle Maffucci, PharmD PGY2 Ambulatory Care Pharmacy Resident, [facility] Phone: [phone]

## 2018-03-27 ENCOUNTER — Other Ambulatory Visit: Payer: Self-pay | Admitting: Pharmacy Technician

## 2018-03-27 NOTE — Patient Outreach (Signed)
[place] [facility]) Care Management  03/27/2018  Teagan Heidrick Rands [birth year] [id number]    Incoming call received from patient in regards to voicemail message he received.  Spoke to Mr. Bailly, HIPPA identifiers verified. Inquired if he had received the 2nd set of patient assistance applications that were mailed to him for [place] and [place] with BI and Dulera and Proventil with [employer]. Patient informed me that he had received the applications as well as his updated Social security statements. He said he has to rely on others for transportation and would fill out the applications and make copies of the necessary documents and then mail everything back. He was not sure when all of this would be able to be done due to his transportation issues. Inquired if he would be wiling to speak to a Education officer, museum who may could help with his transportation needs but he declined saying it was just due to special circumstances and the holidays. He normally doesn't have issues with transportation.  Will submit the completed applications to the companies once his portion is received.  Kristalynn Coddington P. Philip Eckersley, [place] Management [phone]

## 2018-03-31 ENCOUNTER — Ambulatory Visit: Payer: Self-pay | Admitting: Internal Medicine

## 2018-04-14 ENCOUNTER — Other Ambulatory Visit: Payer: Self-pay | Admitting: Pharmacy Technician

## 2018-04-14 NOTE — Patient Outreach (Signed)
[place] [facility]) Care Management  04/14/2018  LUKEN SHADOWENS [birth year] [id number]    Received all necessary documents from both patient and provider for Merck patient assistance for [facility] & Proventil and BI patient assistance for [place].  Mailed Building control surveyor.  Will followup with Merck in 10-14 business days to confirm receipt of application and subsequent mailing out of attestation letter.  Will followup with BI in 10-14 business days to inquire on status of application.  Anaiyah Anglemyer P. Luc Shammas, [place] Management [phone]

## 2018-04-17 ENCOUNTER — Ambulatory Visit (INDEPENDENT_AMBULATORY_CARE_PROVIDER_SITE_OTHER): Payer: Medicare Other | Admitting: Internal Medicine

## 2018-04-17 ENCOUNTER — Encounter: Payer: Self-pay | Admitting: Internal Medicine

## 2018-04-17 VITALS — BP 160/86 | HR 108 | Resp 16 | Ht 73.0 in | Wt 230.0 lb

## 2018-04-17 DIAGNOSIS — I10 Essential (primary) hypertension: Secondary | ICD-10-CM | POA: Diagnosis not present

## 2018-04-17 DIAGNOSIS — J449 Chronic obstructive pulmonary disease, unspecified: Secondary | ICD-10-CM

## 2018-04-17 DIAGNOSIS — G4733 Obstructive sleep apnea (adult) (pediatric): Secondary | ICD-10-CM | POA: Diagnosis not present

## 2018-04-17 DIAGNOSIS — Z9989 Dependence on other enabling machines and devices: Secondary | ICD-10-CM

## 2018-04-17 DIAGNOSIS — R42 Dizziness and giddiness: Secondary | ICD-10-CM

## 2018-04-17 NOTE — Patient Instructions (Signed)

## 2018-04-17 NOTE — Progress Notes (Addendum)
[facility] [place],  [address]  Pulmonary Sleep Medicine   Office Visit Note  Patient Name: Patrick Grant DOB: July 07, 1955 MRN [id number]  Date of Service: 04/17/2018  Complaints/HPI: Patient here for follow-up on OSA and COPD.  Reports that his COPD is moderately managed at this time.  He is using his inhalers without difficulty and denies any recent hospitalizations or issues.  Patient has no chest pain, shortness of breath or other complaints.  Patient's left foot is in an orthopedic boot today.  Patient reports he is being seen for ongoing pain.  He reports the pain is significant today.  He reports some continued intermittent dizziness that is not exactly new for him.  He is not currently dizzy in the office.  ROS  General: (-) fever, (-) chills, (-) night sweats, (-) weakness Skin: (-) rashes, (-) itching,. Eyes: (-) visual changes, (-) redness, (-) itching. Nose and Sinuses: (-) nasal stuffiness or itchiness, (-) postnasal drip, (-) nosebleeds, (-) sinus trouble. Mouth and Throat: (-) sore throat, (-) hoarseness. Neck: (-) swollen glands, (-) enlarged thyroid, (-) neck pain. Respiratory: -  cough, (-) bloody sputum, + shortness of breath, - wheezing. Cardiovascular: - ankle swelling, (-) chest pain. Lymphatic: (-) lymph node enlargement. Neurologic: (-) numbness, (-) tingling. Psychiatric: (-) anxiety, (-) depression   Current Medication: Outpatient Encounter Medications as of 04/17/2018  Medication Sig Note  . albuterol (PROVENTIL HFA;VENTOLIN HFA) 108 (90 Base) MCG/ACT inhaler Inhale 2 puffs into the lungs every 6 (six) hours as needed for wheezing or shortness of breath.   . empagliflozin (JARDIANCE) 25 MG TABS tablet Take 25 mg by mouth daily.   Marland Kitchen glucose blood (ONE TOUCH ULTRA TEST) test strip USE 1 STRIP TO CHECK GLUCOSE TWICE DAILY *BEFORE  BREAKFAST  AND  SUPPER*   . lisinopril (PRINIVIL,ZESTRIL) 10 MG tablet TAKE 1 TABLET BY  MOUTH ONCE A DAY   . metFORMIN (GLUCOPHAGE) 1000 MG tablet TAKE 1 TABLET BY MOUTH TWICE A DAY WITH A MEAL   . mometasone-formoterol (DULERA) 100-5 MCG/ACT AERO Inhale 2 puffs into the lungs 2 (two) times daily at 10 AM and 5 PM.   . nitroGLYCERIN (NITROSTAT) 0.4 MG SL tablet Place 1 tablet (0.4 mg total) under the tongue every 5 (five) minutes as needed. 06/12/2017: Reports has not needed recently   . pravastatin (PRAVACHOL) 20 MG tablet TAKE 1 TABLET BY MOUTH ONCE A DAY   . tiotropium (SPIRIVA HANDIHALER) 18 MCG inhalation capsule Place 1 capsule (18 mcg total) into inhaler and inhale daily.    No facility-administered encounter medications on file as of 04/17/2018.     Surgical History: Past Surgical History:  Procedure Laterality Date  . SPINAL CORD STIMULATOR REMOVAL  2010   DUE TO LOSS OF MUSCLE USE WHEN ACTIVATED    Medical History: Past Medical History:  Diagnosis Date  . COPD (chronic obstructive pulmonary disease) ([place])   . CRPS (complex regional pain syndrome)   . Diabetes mellitus without complication ([place])   . Hyperlipidemia   . Hypertension     Family History: Family History  Problem Relation Age of Onset  . CVA Mother   . Kidney failure Mother   . Diabetes Mother   . CVA Father   . Kidney failure Father   . Colon cancer Father   . Ovarian cancer Sister   . Diabetes Brother   . Healthy Sister     Social History: Social History   Socioeconomic History  .  Marital status: Single    Spouse name: Not on file  . Number of children: 0  . Years of education: Not on file  . Highest education level: Some college, no degree  Occupational History  . Occupation: disability  Social Needs  . Financial resource strain: Not hard at all  . Food insecurity:    Worry: Never true    Inability: Never true  . Transportation needs:    Medical: No    Non-medical: No  Tobacco Use  . Smoking status: Former Smoker    Years: 25.00    Types: Cigars    Last attempt to  quit: 03/12/2016    Years since quitting: 2.0  . Smokeless tobacco: Never Used  Substance and Sexual Activity  . Alcohol use: No    Alcohol/week: 0.0 standard drinks  . Drug use: No  . Sexual activity: Not on file  Lifestyle  . Physical activity:    Days per week: 0 days    Minutes per session: 0 min  . Stress: To some extent  Relationships  . Social connections:    Talks on phone: Patient refused    Gets together: Patient refused    Attends religious service: Patient refused    Active member of club or organization: Patient refused    Attends meetings of clubs or organizations: Patient refused    Relationship status: Patient refused  . Intimate partner violence:    Fear of current or ex partner: Patient refused    Emotionally abused: Patient refused    Physically abused: Patient refused    Forced sexual activity: Patient refused  Other Topics Concern  . Not on file  Social History Narrative  . Not on file    Vital Signs: Blood pressure (!) 160/86, pulse (!) 108, resp. rate 16, height 6\' 1"  (1.854 m), weight 230 lb (104.3 kg), SpO2 97 %.  Examination: General Appearance: The patient is well-developed, well-nourished, and in no distress. Skin: Gross inspection of skin unremarkable. Head: normocephalic, no gross deformities. Eyes: no gross deformities noted. ENT: ears appear grossly normal no exudates. Neck: Supple. No thyromegaly. No LAD. Respiratory: clear bilaterally.. Cardiovascular: Normal S1 and S2 without murmur or rub. Extremities: No cyanosis. pulses are equal. Neurologic: Alert and oriented. No involuntary movements.  LABS: No results found for this or any previous visit (from the past 2160 hour(s)).  Radiology: Ct Head Wo Contrast  Result Date: 07/29/2017 CLINICAL DATA:  Severe dizziness with ataxia for 1 week. EXAM: CT HEAD WITHOUT CONTRAST TECHNIQUE: Contiguous axial images were obtained from the base of the skull through the vertex without intravenous  contrast. COMPARISON:  09/08/2013 FINDINGS: Brain: There is no evidence of acute infarct, intracranial hemorrhage, mass, midline shift, or extra-axial fluid collection. The ventricles and sulci are normal. Patchy cerebral white matter hypodensities are greatest in the parietal lobes and have not significantly changed, nonspecific but compatible with moderate chronic small vessel ischemic disease given patient's vascular risk factors. Vascular: No hyperdense vessel. Skull: No fracture or focal osseous lesion. Sinuses/Orbits: Moderate bilateral ethmoid air cell mucosal thickening. No significant mastoid fluid. Unremarkable visualized orbits. Other: None. IMPRESSION: 1. No evidence of acute intracranial abnormality. 2. Moderate chronic small vessel ischemic disease. Electronically Signed   By: Logan Bores M.D.   On: 07/29/2017 08:39    No results found.  No results found.    Assessment and Plan: Patient Active Problem List   Diagnosis Date Noted  . Dizziness 09/02/2017  . Pulmonary emphysema ([place]) 07/05/2017  .  Scrotal pain, Right 04/19/2016  . Prostate cancer screening 04/19/2016  . Cervical pain 02/08/2015  . Cervical nerve root disorder 02/08/2015  . CRPS (complex regional pain syndrome), lower limb 02/08/2015  . Bleeding from the nose 02/08/2015  . Reflex sympathetic dystrophy of lower extremity 02/08/2015  . Type 2 diabetes mellitus ([place]) 02/08/2015  . Combined fat and carbohydrate induced hyperlipemia 04/06/2008  . Elevated fasting blood sugar 10/10/2007  . Essential (primary) hypertension 09/30/2007   1. Chronic obstructive pulmonary disease, unspecified COPD type ([place]) Patient has severe COPD.  Encouraged him to continue taking his inhalers and other medications as prescribed.  He reports his COPD is moderately controlled at this time denies any issues.  2. OSA on CPAP Patient reports he is not currently using his CPAP.  He reports his CPAP makes his dizziness worse at times.  I  have encouraged patient to continue use CPAP as directed.  3. Hypertension, unspecified type Elevated today likely due to pain level in his left lower extremity.  4. Dizziness and giddiness Continues to intermittent dizziness.  Encourage patient to follow-up with his primary care provider or neurology as before.  General Counseling: I have discussed the findings of the evaluation and examination with O[place].  I have also discussed any further diagnostic evaluation thatmay be needed or ordered today. Ronold verbalizes understanding of the findings of todays visit. We also reviewed his medications today and discussed drug interactions and side effects including but not limited excessive drowsiness and altered mental states. We also discussed that there is always a risk not just to him but also people around him. he has been encouraged to call the office with any questions or concerns that should arise related to todays visit.    Time spent: 25  I have personally obtained a history, examined the patient, evaluated laboratory and imaging results, formulated the assessment and plan and placed orders.    Allyne Gee, MD [facility] Pulmonary and Critical Care Sleep medicine

## 2018-05-01 ENCOUNTER — Other Ambulatory Visit: Payer: Self-pay | Admitting: Pharmacy Technician

## 2018-05-01 ENCOUNTER — Ambulatory Visit: Payer: Self-pay | Admitting: Family Medicine

## 2018-05-01 NOTE — Patient Outreach (Signed)
[place] [facility]) Care Management  05/01/2018  Patrick Grant Apr 07, 1955 [id number]   Care coordination call placed to Merck patient assistance in regards to patient's [facility] and Proventil application.  Spoke to [place] who says they are not showing his 9166 application on file yet. He says they are running behind due to the amount of applications coming in. He suggested calling back sometime next week of inquire if data entry has processed the application.  Care coordination call placed to BI in regards to patient's Jardiance and Spiriva applications. Spoke to [place] who said patient had been APPROVED for these 2 medications as of 04/21/2018-03/26/2019. HE said patient was provided 90 days supply of Jardiance and 3 Sprivia inhalers which were shipped on 1/30 but not yet delivered. He informed that it can take up 5-7 business days to arrive at patient's home.  Will followup with Merck and patient in 5-7 business days to inquire if Merck has received application and with patient to see if he has received the medications from [place] (jardiance and sprivia).  [name] P. Johnnay Pleitez, [place] Management [phone]

## 2018-05-02 ENCOUNTER — Ambulatory Visit: Payer: Medicare Other | Admitting: Family Medicine

## 2018-05-05 ENCOUNTER — Other Ambulatory Visit: Payer: Self-pay | Admitting: Pharmacy Technician

## 2018-05-05 NOTE — Patient Outreach (Signed)
[facility]) Care Management  05/05/2018  GURNEY BALTHAZOR [birth year] [id number]  Unsuccessful outreach call placed to patient in regards to his BI patient assistance application for Sprivia and Jardiance as well as his Scientist, clinical (histocompatibility and immunogenetics) for [employer] and Proventil.  Unfortunately patient did not answer the phone. HIPAA compliant voicemail left. Was calling to inquire if patient had received his medications from [facility] patient assistance and to update him on the Merck application.  Will make 2nd call attempt in 3-5 business days if call is not returned.  Cormick Moss P. Lina Hitch, [place] Management [phone]

## 2018-05-06 NOTE — Progress Notes (Signed)
Patient: Patrick Grant Male    DOB: [birth year]   63 y.o.   MRN: [id number] Visit Date: 05/09/2018  Today's Provider: Vernie Murders, PA   Chief Complaint  Patient presents with  . Follow-up   Subjective:     HPI    Diabetes Mellitus Type II, Follow-up:   Lab Results  Component Value Date   HGBA1C 8.2 (A) 05/09/2018   HGBA1C 7.3 (A) 01/10/2018   HGBA1C 10.8 (H) 06/05/2017   Last seen for diabetes 4 months ago.  Management since then includes; labs checked, no changes. He reports good compliance with treatment. He is not having side effects. none Current symptoms include none and have been unchanged. Home blood sugar records: fasting range: 100  Episodes of hypoglycemia? no   Current Insulin Regimen: n/a Most Recent Eye Exam: past due Weight trend: stable Prior visit with dietician: no Current diet: well balanced Current exercise: none  ---------------------------------------------------------------    Hypertension, follow-up:  BP Readings from Last 3 Encounters:  05/09/18 110/70  04/17/18 (!) 160/86  02/13/18 124/70    He was last seen for hypertension 4 months ago.  BP at that visit was 160/86. Management since that visit includes; labs checked, no changes.He reports good compliance with treatment. He is not having side effects. none He is not exercising. He is adherent to low salt diet.   Outside blood pressures are normal. He is experiencing none.  Patient denies none.   Cardiovascular risk factors include diabetes mellitus.  Use of agents associated with hypertension: none.   ---------------------------------------------------------------   Complex regional pain syndrome type 1 of left lower extremity From 01/10/2018-labs checked, no changes. Continues to have severe pains and sweating in the left foot. When pain intense, will have both palms sweating and some times the right foot. Putting the left foot in a closed box with a heating  pad on it, can help pain sometimes.. Can't tolerate touch, shoes or barometric pressure changes.  Past Medical History:  Diagnosis Date  . COPD (chronic obstructive pulmonary disease) ([place])   . CRPS (complex regional pain syndrome)   . Diabetes mellitus without complication ([place])   . Hyperlipidemia   . Hypertension    Past Surgical History:  Procedure Laterality Date  . SPINAL CORD STIMULATOR REMOVAL  2010   DUE TO LOSS OF MUSCLE USE WHEN ACTIVATED   Family History  Problem Relation Age of Onset  . CVA Mother   . Kidney failure Mother   . Diabetes Mother   . CVA Father   . Kidney failure Father   . Colon cancer Father   . Ovarian cancer Sister   . Diabetes Brother   . Healthy Sister    Allergies  Allergen Reactions  . Almond (Diagnostic) Swelling    throat  . Fentanyl Other (See Comments)    Other reaction(s): Other (See Comments) Jittery Other Reaction: Other reaction  . Clove Flavor Other (See Comments)    Loss of vision for 1-2 hrs    Current Outpatient Medications:  .  albuterol (PROVENTIL HFA;VENTOLIN HFA) 108 (90 Base) MCG/ACT inhaler, Inhale 2 puffs into the lungs every 6 (six) hours as needed for wheezing or shortness of breath., Disp: 18 g, Rfl: 3 .  empagliflozin (JARDIANCE) 25 MG TABS tablet, Take 25 mg by mouth daily., Disp: 90 tablet, Rfl: 3 .  glucose blood (ONE TOUCH ULTRA TEST) test strip, USE 1 STRIP TO CHECK GLUCOSE TWICE DAILY *BEFORE  BREAKFAST  AND  SUPPER*, Disp: 200 each, Rfl: 4 .  lisinopril (PRINIVIL,ZESTRIL) 10 MG tablet, TAKE 1 TABLET BY MOUTH ONCE A DAY, Disp: 90 tablet, Rfl: 3 .  metFORMIN (GLUCOPHAGE) 1000 MG tablet, TAKE 1 TABLET BY MOUTH TWICE A DAY WITH A MEAL, Disp: 180 tablet, Rfl: 3 .  mometasone-formoterol (DULERA) 100-5 MCG/ACT AERO, Inhale 2 puffs into the lungs 2 (two) times daily at 10 AM and 5 PM., Disp: 13 g, Rfl: 3 .  nitroGLYCERIN (NITROSTAT) 0.4 MG SL tablet, Place 1 tablet (0.4 mg total) under the tongue every 5 (five)  minutes as needed., Disp: 25 tablet, Rfl: 6 .  pravastatin (PRAVACHOL) 20 MG tablet, TAKE 1 TABLET BY MOUTH ONCE A DAY, Disp: 90 tablet, Rfl: 3 .  tiotropium (SPIRIVA HANDIHALER) 18 MCG inhalation capsule, Place 1 capsule (18 mcg total) into inhaler and inhale daily., Disp: 90 capsule, Rfl: 3  Review of Systems  Constitutional: Negative for appetite change, chills and fever.  Respiratory: Negative for chest tightness, shortness of breath and wheezing.   Cardiovascular: Negative for chest pain and palpitations.  Gastrointestinal: Negative for abdominal pain, nausea and vomiting.   Social History   Tobacco Use  . Smoking status: Former Smoker    Years: 25.00    Types: Cigars    Last attempt to quit: 03/12/2016    Years since quitting: 2.1  . Smokeless tobacco: Never Used  Substance Use Topics  . Alcohol use: No    Alcohol/week: 0.0 standard drinks     Objective:   BP 110/70 (BP Location: Right Arm, Patient Position: Sitting, Cuff Size: Large)   Pulse 99   Temp 98.7 F (37.1 C) (Oral)   Resp 16   Wt 224 lb (101.6 kg)   SpO2 96%   BMI 29.55 kg/m  Vitals:   05/09/18 1627  BP: 110/70  Pulse: 99  Resp: 16  Temp: 98.7 F (37.1 C)  TempSrc: Oral  SpO2: 96%  Weight: 224 lb (101.6 kg)   Physical Exam Constitutional:      General: He is not in acute distress.    Appearance: He is well-developed.  HENT:     Head: Normocephalic and atraumatic.     Right Ear: Hearing and tympanic membrane normal.     Left Ear: Hearing and tympanic membrane normal.     Nose: Nose normal.     Mouth/Throat:     Pharynx: Oropharynx is clear.  Eyes:     General: Lids are normal. No scleral icterus.       Right eye: No discharge.        Left eye: No discharge.     Conjunctiva/sclera: Conjunctivae normal.  Cardiovascular:     Rate and Rhythm: Normal rate and regular rhythm.     Heart sounds: Normal heart sounds.  Pulmonary:     Effort: Pulmonary effort is normal. No respiratory distress.      Breath sounds: Normal breath sounds.  Abdominal:     General: Bowel sounds are normal.     Palpations: Abdomen is soft.  Musculoskeletal:     Comments: Extreme pain in the left foot with shooting pains radiating up to the knee occasionally. Foot sensitive to temperature and barometric pressure changes. Can't tolerate touch or shoes. Pain causes palms and soles of feet to sweat when intense.  Skin:    Findings: No lesion or rash.  Neurological:     Mental Status: He is alert and oriented to person, place, and time.  Psychiatric:  Speech: Speech normal.        Behavior: Behavior normal.        Thought Content: Thought content normal.       Assessment & Plan    1. Type 2 diabetes mellitus with hyperosmolarity without coma, without long-term current use of insulin (HCC) FBS 100 today. Some increase in urine output with getting back on the Jardiance 25 mg qd with the Metformin 1000 mg BID (was off the Jardiance for a month because of paperwork delay in getting medication assistance). Feeling well. Unable to get foot exam due to severe pain of CRPS. Hgb A1C 8.5% today (was 7.2% 3 months ago). Will get back on this regimen regularly and recheck in 3 months to get fasting labs. - POCT glycosylated hemoglobin (Hb A1C)  2. Essential (primary) hypertension Well controlled without dyspnea, peripheral edema, palpitations or chest pains. Tolerating Lisinopril 10 mg qd without side effects.  3. Complex regional pain syndrome type 1 of left lower extremity Essentially unchanged. Onset in 2008 following a fracture in the left foot. Continues to use crutches to walk and unable to wear shoes due to constant severe pain. Attempt to use narcotic analgesic in the past was unsuccessful and stopped due to fear of addiction.     Vernie Murders, PA  [facility]

## 2018-05-08 ENCOUNTER — Other Ambulatory Visit: Payer: Self-pay | Admitting: Pharmacy Technician

## 2018-05-08 NOTE — Patient Outreach (Signed)
[place] [facility]) Care Management  05/08/2018  Kutler Vanvranken Vetrano 1956/02/08 [id number]    Incoming call recevied from patient, HIPAA identifiers verified.   Patient was returning my call. Inquired if patient had received his BI patient assistance medications. Patient informed he had received 3 Sprivia inhalers and 90 Jardiance. Informed patient how to place his refill orders and patient verbalized understanding informing me that it was the same process as last year. Informed patient that I would be in touch with him concerning his Merck application and informed him that they were running behind on processing.  Will followup with patient in the next 14-21 business days with an update on the [place] applications.  Tiffanye Hartmann P. Sumayya Muha, [place] Management [phone]

## 2018-05-08 NOTE — Patient Outreach (Signed)
[facility]) Care Management  05/08/2018  Almer Bushey Handley 10-03-1955 [id number]   Second outreach attempt made to patient to inquire if he has received his Spiriva and Jardiance through [facility] patient assistance.  Unfortunately patient did not answer the phone. HIPAA compliant voicemail left.  Will followup with 3rd call attempt in 3-5 days.  Nainoa Woldt P. Yale Golla, [place] Management [phone]

## 2018-05-09 ENCOUNTER — Encounter: Payer: Self-pay | Admitting: Family Medicine

## 2018-05-09 ENCOUNTER — Ambulatory Visit (INDEPENDENT_AMBULATORY_CARE_PROVIDER_SITE_OTHER): Payer: Medicare Other | Admitting: Family Medicine

## 2018-05-09 VITALS — BP 110/70 | HR 99 | Temp 98.7°F | Resp 16 | Wt 224.0 lb

## 2018-05-09 DIAGNOSIS — I10 Essential (primary) hypertension: Secondary | ICD-10-CM

## 2018-05-09 DIAGNOSIS — G90522 Complex regional pain syndrome I of left lower limb: Secondary | ICD-10-CM

## 2018-05-09 DIAGNOSIS — E1100 Type 2 diabetes mellitus with hyperosmolarity without nonketotic hyperglycemic-hyperosmolar coma (NKHHC): Secondary | ICD-10-CM | POA: Diagnosis not present

## 2018-05-09 LAB — POCT GLYCOSYLATED HEMOGLOBIN (HGB A1C)
ESTIMATED AVERAGE GLUCOSE: 189
HEMOGLOBIN A1C: 8.2 % — AB (ref 4.0–5.6)

## 2018-05-12 ENCOUNTER — Other Ambulatory Visit: Payer: Self-pay | Admitting: Pharmacy Technician

## 2018-05-12 NOTE — Patient Outreach (Signed)
[place] [facility]) Care Management  05/12/2018  Patrick Grant February 03, 1956 [id number]  ADDENDUM  Successful outreach call placed to patient in regards to his [employer] patient assistance applications for dulera and proventil.  Spoke to patient, HIPAA identifiers verified. Informed patient that [employer] had mailed the attestation letter to him on 05/08/2018 and that it should be arriving in the next 7-10 days. Informed patient to give me a call when he received the letter so that we could discuss it together. Patient verbalized understanding and confirmed having my name and phone number saved.  Will followup with patient in 10-14 business days if call has not been returned.  Jessee Newnam P. [name], [place] Management [phone]

## 2018-05-12 NOTE — Patient Outreach (Signed)
[place] [facility]) Care Management  05/12/2018  Patrick Grant 1956/02/18 [id number]   Care coordination call placed to [employer] patient assistance in regards to his Dulera and Proventil applications.  Spoke to Dixie who said they received the application on 3/35/4562 and she informed that [employer] subsequently mailed out the attestation letter on 05/08/2018 and that patient should be receiving it shortly.  Will followup with patient today to alert him to be on the lookout for the letter in the mail.  Bralee Feldt P. Vyncent Overby, [place] Management [phone]

## 2018-05-16 ENCOUNTER — Other Ambulatory Visit: Payer: Self-pay | Admitting: Pharmacy Technician

## 2018-05-16 NOTE — Patient Outreach (Signed)
[place] [facility]) Care Management  05/16/2018  LAUREN AGUAYO 03/05/1956 [id number]   Incoming call received from patient in regards to his [employer] application for [employer] and Proventil HFA.  Patient was calling to inform me that he had received the attestation letter from [employer], HIPAA identifiers verified. Inquired if patient had any questions about the attestation form and he informed me that he did not. He informed that he answered them the same as last year. Informed patient to make sure he includes all documents including the signed attestation letter in the envelope that [employer] provided and mail it back to them. Patient informed that he would place in the mail tomorrow.  Will followup with [employer] patient assistance in 10-14 business days to confirm they have received the information back and to try and get delivery information.  Elain Wixon P. Laynee Lockamy, [place] Management [phone]

## 2018-05-29 ENCOUNTER — Other Ambulatory Visit: Payer: Self-pay | Admitting: Pharmacy Technician

## 2018-05-29 NOTE — Patient Outreach (Signed)
[place] [facility]) Care Management  05/29/2018  Patrick Grant 12-11-1955 [id number]    Care coordination call placed to [employer] patient assistance in reference to patient's application for [facility] and Proventil.  Spoke to Herricks who said patient had been APPROVED 05/22/2018-03/26/2019. Per Irma a request had been made to the pharmacy on 05/23/2018. Irma informed that the pharmacy was still processing the prescription but that it can take up to 7 days to process then another 10-14 to arrive at patient's home. She informed that she believes patient will be receiving 1 inhaler of each unless pharmacy called provider's office and got it change. She said that [facility] would be able to tell me more if I chose to call them at [phone].  Will followup with patient in 10-14 business days to inquire if he has received the medication and to verify how much of each inhaler he received and answer any questions he may have in regards to patient assistance process.  Edna Rede P. Darionna Banke, [place] Management [phone]

## 2018-06-04 ENCOUNTER — Other Ambulatory Visit: Payer: Self-pay | Admitting: Pharmacy Technician

## 2018-06-04 ENCOUNTER — Encounter: Payer: Self-pay | Admitting: Family Medicine

## 2018-06-04 NOTE — Patient Outreach (Signed)
[facility]) Care Management  06/04/2018  Patrick Grant 28-Apr-1955 [id number]   Successful outreach call placed to patient in regards to [employer] patient assistance application for  [facility] and Proventil.  Spoke to patient, HIPAA identifiers verified. Inquired if patient had received any medication from the patient assistance foundation to which he replied not yet. Informed patient that he had been approved for the program and should be receiving the medication shortly. Patient verbalized understanding.  Will followup with patient in 7-10 business days to inquire if he has received the medicaiton.  Kinan Safley P. Rhylen Pulido, [place] Management [phone]

## 2018-06-10 ENCOUNTER — Telehealth: Payer: Self-pay | Admitting: Family Medicine

## 2018-06-10 NOTE — Telephone Encounter (Signed)
Please advise 

## 2018-06-10 NOTE — Telephone Encounter (Signed)
Pt's room mate called about her being exposed to her two year grandchild.  They want to know if it is a risk if she does this to his health  CB#  [phone]  Thanks Con Memos

## 2018-06-10 NOTE — Telephone Encounter (Signed)
Patient worried about his house mate (girlfriend) going to the hospital to see a newborn and babysit a 61 year old grandchild. He is concerned they may get "exposed", bring the coronavirus back to the home and expose him. States the "nurse" told him his risk if very high for mortality with his history of COPD. Advised he should not go to the hospital, be sure to wash his hands frequently and try not to touch his face with his hands. He agreed.

## 2018-06-12 ENCOUNTER — Other Ambulatory Visit: Payer: Self-pay | Admitting: Pharmacy Technician

## 2018-06-12 NOTE — Patient Outreach (Signed)
[facility]) Care Management  06/12/2018  Patrick Grant 05-Mar-1956 [id number]  Care coordination call placed to [employer] patient assistance in regards to patient's application for [facility] and Proventil.  Spoke to Christy Sartorius who said the medication shipped on March 6 and was received on June 10, 2018 at 12:38pm.  Will followup with patient to see if he has received the medication.  Ilea Hilton P. Zarielle Cea, [place] Management [phone]

## 2018-06-12 NOTE — Patient Outreach (Signed)
[facility]) Care Management  06/12/2018  Zahki Hoogendoorn Marcus 12-31-1955 [id number]  Successful outreach call placed to patient in regards to patient assistance application for [facility] and Proventil through [employer].  Spoke to patient, HIPAA identifiers verified.   Inquired if patient had received his medication from the patient assistance foundation. Patient informed he had received the medications. Inquired if patient knew how to order these medications as as well as his other medications that he gets through [facility] patient assistance. Patient informed that he knew how to order his refills by calling the respective companies as he did in the past. Informed patient that was the correct procedure. Inquired if patient had any other questions or concerns with relation to medication affordability and patient stated he did not. Confirmed patient had our name and numbers for future reference.  Will remove myself from care team and route note to [facility] for case closure as patient assistance is complete.  Stephenie Navejas P. Braxton Weisbecker, [place] Management [phone]

## 2018-07-17 ENCOUNTER — Other Ambulatory Visit: Payer: Self-pay | Admitting: Family Medicine

## 2018-07-17 DIAGNOSIS — E1100 Type 2 diabetes mellitus with hyperosmolarity without nonketotic hyperglycemic-hyperosmolar coma (NKHHC): Secondary | ICD-10-CM

## 2018-07-17 MED ORDER — METFORMIN HCL 1000 MG PO TABS: ORAL_TABLET | ORAL | 3 refills | Status: DC

## 2018-07-17 MED ORDER — LISINOPRIL 10 MG PO TABS: 10.0000 mg | ORAL_TABLET | Freq: Every day | ORAL | 3 refills | Status: DC

## 2018-07-17 MED ORDER — PRAVASTATIN SODIUM 20 MG PO TABS: 20.0000 mg | ORAL_TABLET | Freq: Every day | ORAL | 3 refills | Status: DC

## 2018-07-17 NOTE — Telephone Encounter (Signed)
[place] faxed refill request for the following medications:  lisinopril (PRINIVIL,ZESTRIL) 10 MG tablet  metFORMIN (GLUCOPHAGE) 1000 MG tablet  pravastatin (PRAVACHOL) 20 MG tablet   Please advise.

## 2018-09-04 IMAGING — CT CT HEAD W/O CM
3 of 4 series · 14 of 47 positions shown, 16 images · non-contrast
Comparison: 09/08/2013

CLINICAL DATA: Severe dizziness with ataxia for 1 week.

EXAM:
CT HEAD WITHOUT CONTRAST
TECHNIQUE: Contiguous axial images were obtained from the base of the skull
through the vertex without intravenous contrast.

[Series 2: head · axial · 0.36mm/px · z∈[-558,-438]mm · 8 of 30 slices shown, 10 images (1 of 3)]
[im 3/30  brain]
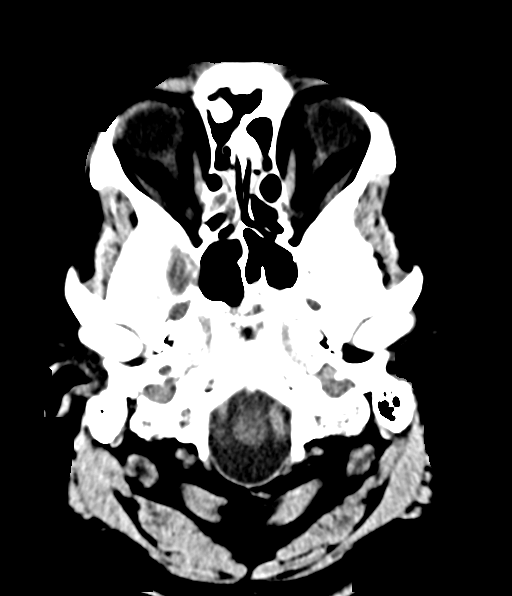
[im 3/30  bone]
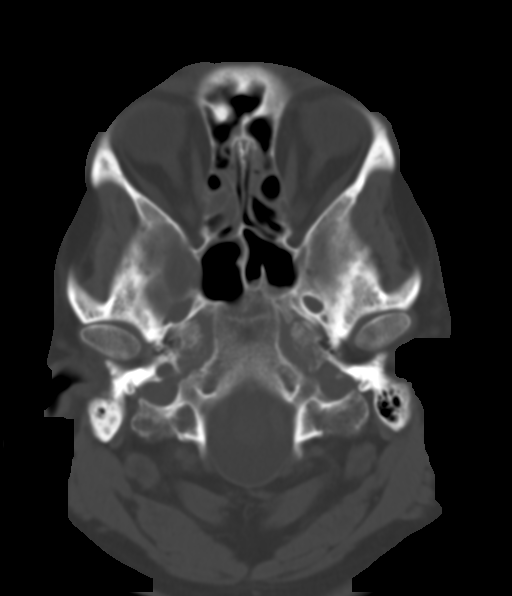
[im 7/30  brain]
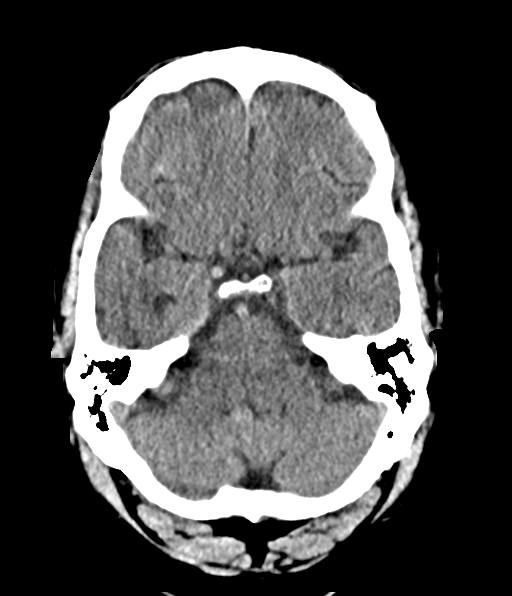
[im 11/30  brain]
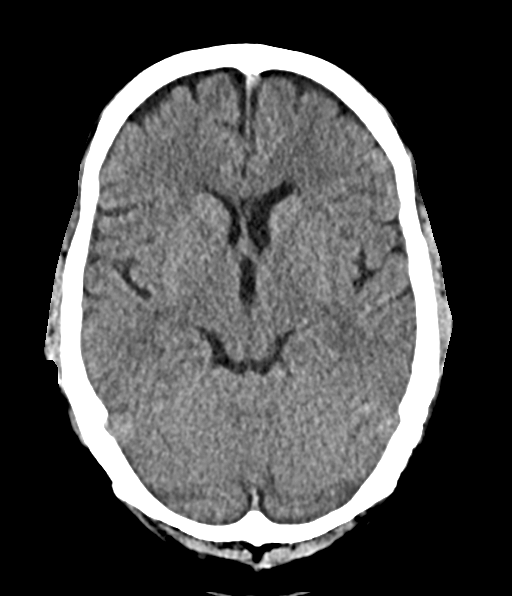
[im 13/30  brain]
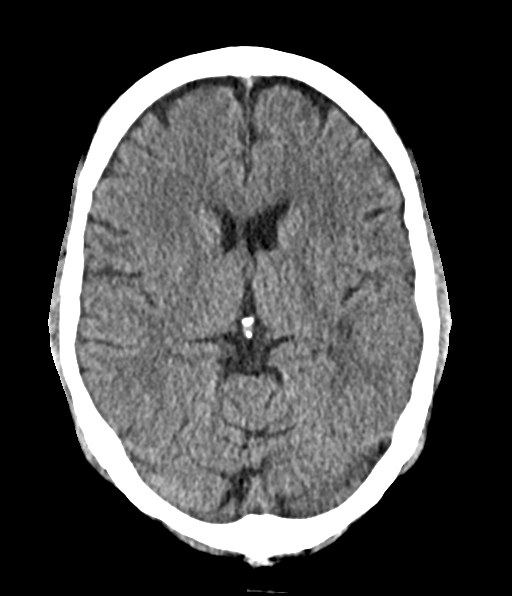
[im 17/30  brain]
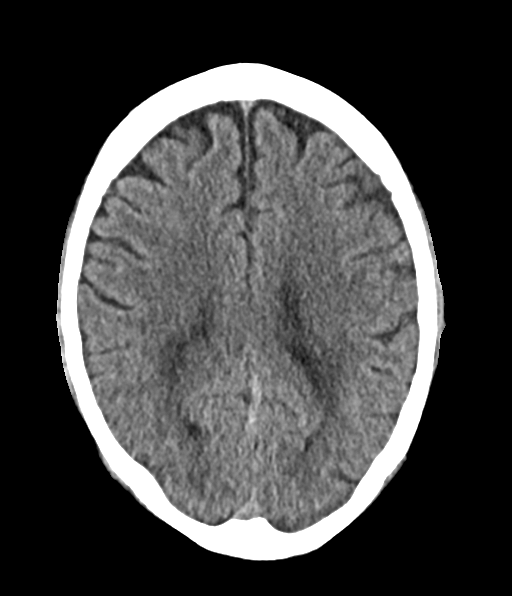
[im 17/30  bone]
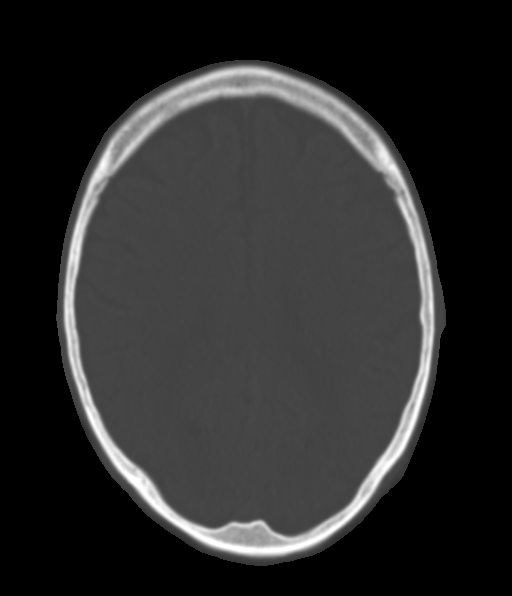
[im 19/30  brain]
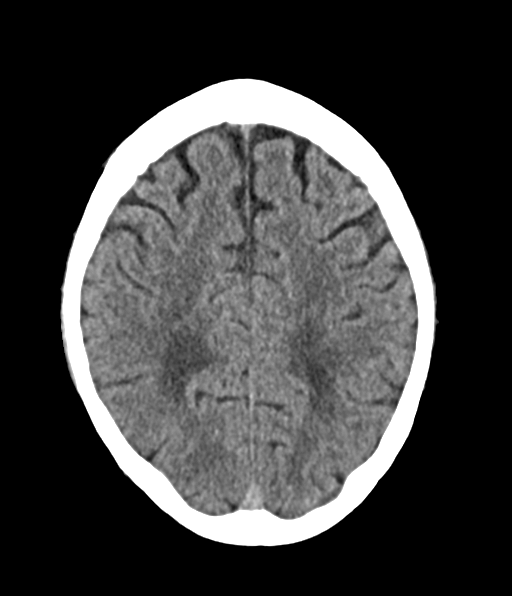
[im 23/30  brain]
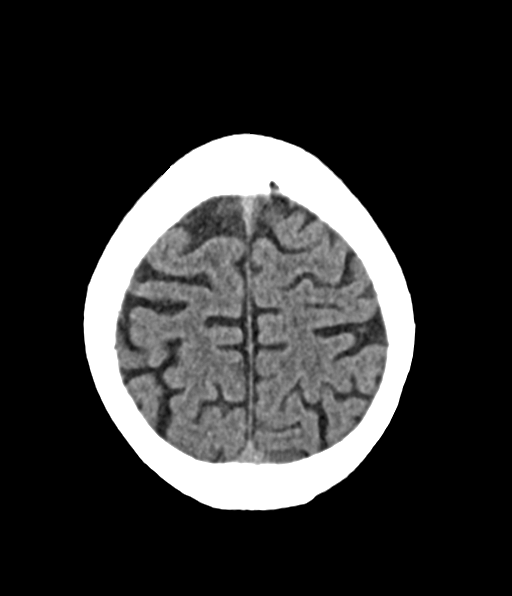
[im 27/30  brain]
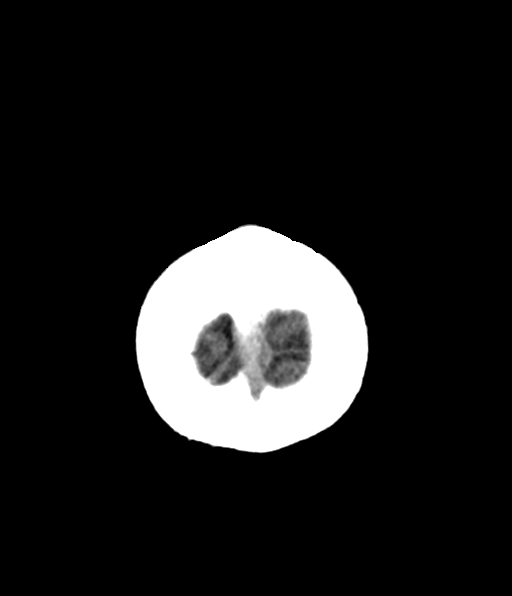

[Series 6: head · coronal · 0.29mm/px · 3 of 70 slices shown (2 of 3)]
[im 24/70  brain]
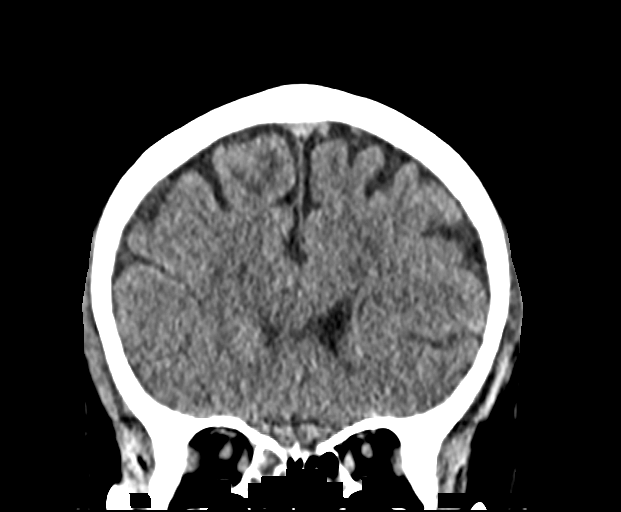
[im 31/70  brain]
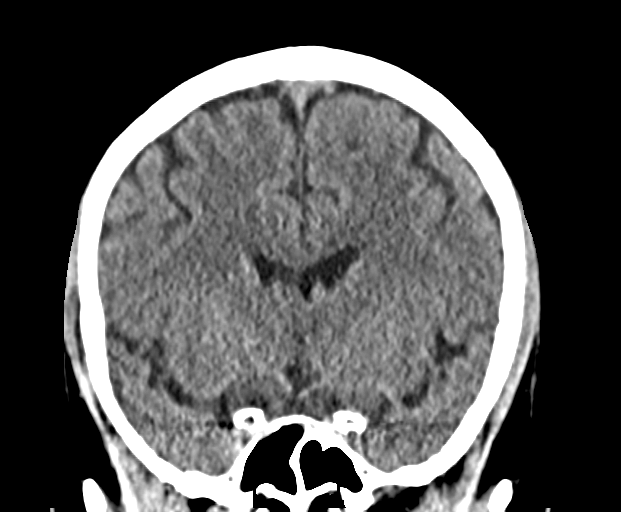
[im 39/70  brain]
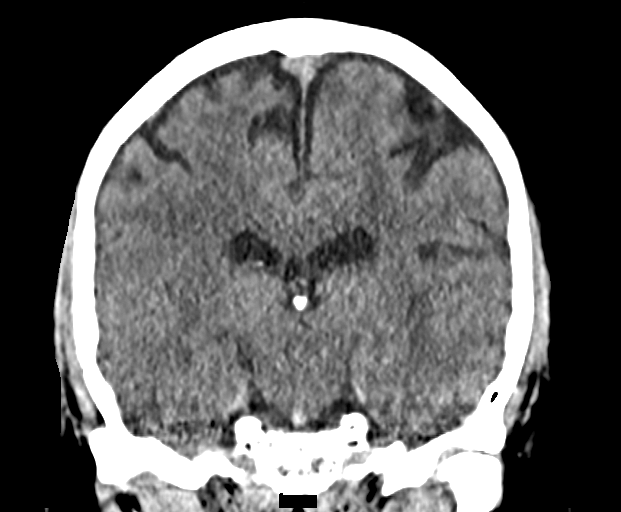

[Series 8: head · sagittal · 0.29mm/px · 3 of 60 slices shown (3 of 3)]
[im 20/60  brain]
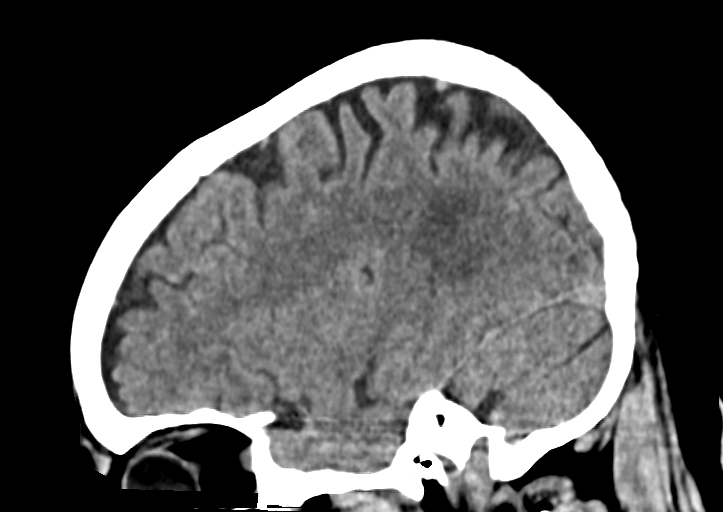
[im 30/60  brain]
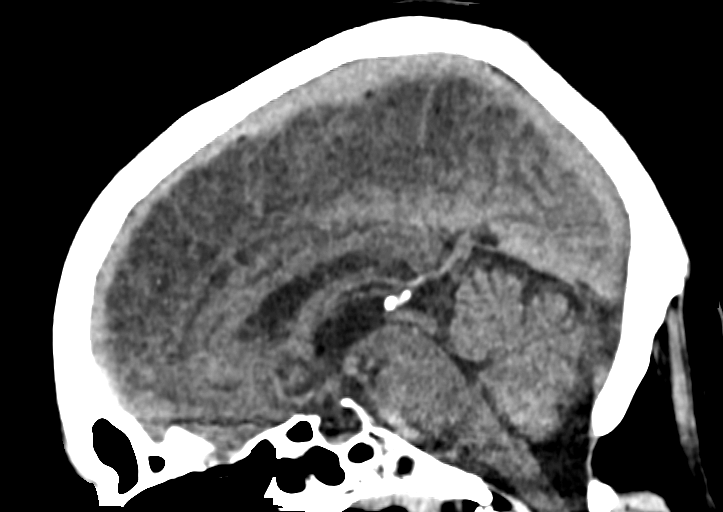
[im 40/60  brain]
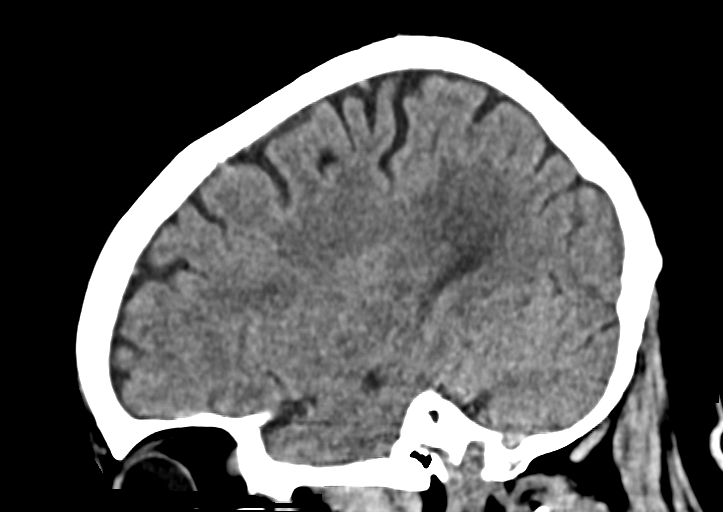

[14 of 47 positions shown; findings below may reference images not displayed]

FINDINGS: Brain: There is no evidence of acute infarct, intracranial
hemorrhage, mass, midline shift, or extra-axial fluid collection.
The ventricles and sulci are normal. Patchy cerebral white matter
hypodensities are greatest in the parietal lobes and have not
significantly changed, nonspecific but compatible with moderate
chronic small vessel ischemic disease given patient's vascular risk
factors.

Vascular: No hyperdense vessel.

Skull: No fracture or focal osseous lesion.

Sinuses/Orbits: Moderate bilateral ethmoid air cell mucosal
thickening. No significant mastoid fluid. Unremarkable visualized
orbits.

Other: None.
IMPRESSION: 1. No evidence of acute intracranial abnormality.
2. Moderate chronic small vessel ischemic disease.

## 2018-09-19 ENCOUNTER — Other Ambulatory Visit: Payer: Self-pay | Admitting: Family Medicine

## 2018-09-19 DIAGNOSIS — E1100 Type 2 diabetes mellitus with hyperosmolarity without nonketotic hyperglycemic-hyperosmolar coma (NKHHC): Secondary | ICD-10-CM

## 2018-09-19 MED ORDER — GLUCOSE BLOOD VI STRP: ORAL_STRIP | 4 refills | Status: DC

## 2018-10-20 ENCOUNTER — Ambulatory Visit: Payer: Self-pay | Admitting: Internal Medicine

## 2019-02-06 DIAGNOSIS — Z23 Encounter for immunization: Secondary | ICD-10-CM | POA: Diagnosis not present

## 2019-02-12 NOTE — Progress Notes (Addendum)
Subjective:   Patrick Grant is a 63 y.o. male who presents for Medicare Annual/Subsequent preventive examination.    This visit is being conducted through telemedicine due to the COVID-19 pandemic. This patient has given me verbal consent via doximity to conduct this visit, patient states they are participating from their home address. Some vital signs may be absent or patient reported.    Patient identification: identified by name, DOB, and current address  Review of Systems:  N/A  Cardiac Risk Factors include: advanced age (>63mn, >>77women);diabetes mellitus;dyslipidemia;male gender;hypertension     Objective:    Vitals: There were no vitals taken for this visit.  There is no height or weight on file to calculate BMI. Unable to obtain vitals due to visit being conducted via telephonically.   Advanced Directives 02/16/2019 02/13/2018 05/13/2017 04/17/2017 02/06/2017 05/23/2016 04/29/2016  Does Patient Have a Medical Advance Directive? No No Yes No No No No  Type of Advance Directive - -Public librarianLiving will - - - -  Copy of HJemisonin Chart? - - No - copy requested - - - -  Would patient like information on creating a medical advance directive? Yes (ED - Information included in AVS) Yes (MAU/Ambulatory/Procedural Areas - Information given) - Yes (MAU/Ambulatory/Procedural Areas - Information given) Yes (ED - Information included in AVS) No - Patient declined No - Patient declined    Tobacco Social History   Tobacco Use  Smoking Status Former Smoker  . Years: 25.00  . Types: Cigars  . Quit date: 03/12/2016  . Years since quitting: 2.9  Smokeless Tobacco Never Used     Counseling given: Not Answered   Clinical Intake:  Pre-visit preparation completed: Yes  Pain : 0-10 Pain Score: 8  Pain Type: Chronic pain Pain Location: Leg Pain Orientation: Left Pain Descriptors / Indicators: Aching Pain Frequency: Constant      Nutritional Risks: None Diabetes: Yes  How often do you need to have someone help you when you read instructions, pamphlets, or other written materials from your doctor or pharmacy?: 4 - Often(Due to vision loss in left eye from diabetes.)   Diabetes:  Is the patient diabetic?  Yes type 2  If diabetic, was a CBG obtained today?  No  Did the patient bring in their glucometer from home?  No  How often do you monitor your CBG's? Twice a day.   Financial Strains and Diabetes Management:  Are you having any financial strains with the device, your supplies or your medication? No .  Does the patient want to be seen by Chronic Care Management for management of their diabetes?  No  Would the patient like to be referred to a Nutritionist or for Diabetic Management?  No   Diabetic Exams:  Diabetic Eye Exam: Overdue for diabetic eye exam. Pt has been advised about the importance in completing this exam.   Diabetic Foot Exam: Completed 07/24/16. Pt has been advised about the importance in completing this exam. Note made to follow up on this at next in office visit.    Interpreter Needed?: No  [facility]  Past Medical History:  Diagnosis Date  . COPD (chronic obstructive pulmonary disease) ([place]   . CRPS (complex regional pain syndrome)   . Diabetes mellitus without complication ([place]   . Hyperlipidemia   . Hypertension    Past Surgical History:  Procedure Laterality Date  . SPINAL CORD STIMULATOR REMOVAL  2010  DUE TO LOSS OF MUSCLE USE WHEN ACTIVATED   Family History  Problem Relation Age of Onset  . CVA Mother   . Kidney failure Mother   . Diabetes Mother   . CVA Father   . Kidney failure Father   . Colon cancer Father   . Ovarian cancer Sister   . Diabetes Brother   . Healthy Sister    Social History   Socioeconomic History  . Marital status: Single    Spouse name: Not on file  . Number of children: 0  . Years of education: Not on file   . Highest education level: Some college, no degree  Occupational History  . Occupation: disability  Social Needs  . Financial resource strain: Not very hard  . Food insecurity    Worry: Never true    Inability: Never true  . Transportation needs    Medical: No    Non-medical: No  Tobacco Use  . Smoking status: Former Smoker    Years: 25.00    Types: Cigars    Quit date: 03/12/2016    Years since quitting: 2.9  . Smokeless tobacco: Never Used  Substance and Sexual Activity  . Alcohol use: No    Alcohol/week: 0.0 standard drinks  . Drug use: No  . Sexual activity: Not on file  Lifestyle  . Physical activity    Days per week: 0 days    Minutes per session: 0 min  . Stress: To some extent  Relationships  . Social Herbalist on phone: Patient refused    Gets together: Patient refused    Attends religious service: Patient refused    Active member of club or organization: Patient refused    Attends meetings of clubs or organizations: Patient refused    Relationship status: Patient refused  Other Topics Concern  . Not on file  Social History Narrative  . Not on file    Outpatient Encounter Medications as of 02/16/2019  Medication Sig  . albuterol (PROVENTIL HFA;VENTOLIN HFA) 108 (90 Base) MCG/ACT inhaler Inhale 2 puffs into the lungs every 6 (six) hours as needed for wheezing or shortness of breath.  . empagliflozin (JARDIANCE) 25 MG TABS tablet Take 25 mg by mouth daily.  Marland Kitchen glucose blood (ONE TOUCH ULTRA TEST) test strip USE 1 STRIP TO CHECK GLUCOSE TWICE DAILY *BEFORE  BREAKFAST  AND  SUPPER*  . lisinopril (ZESTRIL) 10 MG tablet Take 1 tablet (10 mg total) by mouth daily.  . metFORMIN (GLUCOPHAGE) 1000 MG tablet TAKE 1 TABLET BY MOUTH TWICE A DAY WITH A MEAL  . mometasone-formoterol (DULERA) 100-5 MCG/ACT AERO Inhale 2 puffs into the lungs 2 (two) times daily at 10 AM and 5 PM.  . nitroGLYCERIN (NITROSTAT) 0.4 MG SL tablet Place 1 tablet (0.4 mg total) under the  tongue every 5 (five) minutes as needed.  . pravastatin (PRAVACHOL) 20 MG tablet Take 1 tablet (20 mg total) by mouth daily.  Marland Kitchen tiotropium (SPIRIVA HANDIHALER) 18 MCG inhalation capsule Place 1 capsule (18 mcg total) into inhaler and inhale daily.   No facility-administered encounter medications on file as of 02/16/2019.     Activities of Daily Living In your present state of health, do you have any difficulty performing the following activities: 02/16/2019  Hearing? N  Vision? Y  Comment Has loss of vision in left eye due to diabetes.  Difficulty concentrating or making decisions? Y  Walking or climbing stairs? Y  Comment Due to left leg/hip  pains and balance.  Dressing or bathing? N  Doing errands, shopping? Y  Comment Pt does not drive.  Preparing Food and eating ? Y  Comment Girlfriend [name]) does cooking.  Using the Toilet? N  In the past six months, have you accidently leaked urine? N  Do you have problems with loss of bowel control? N  Managing your Medications? N  Managing your Finances? Y  Comment Girlfriend manages finances.  Housekeeping or managing your Housekeeping? Y  Comment Girlfriend does cleaning.  Some recent data might be hidden    Patient Care Team: Chrismon, Vickki Muff, PA as PCP - General (Family Medicine) Allyne Gee, MD as Consulting Physician (Internal Medicine)   Assessment:   This is a routine wellness examination for [place].  Exercise Activities and Dietary recommendations Current Exercise Habits: The patient does not participate in regular exercise at present, Exercise limited by: orthopedic condition(s)  Goals    . Increase water intake     Starting 01/31/16, I will continue to drink 6-8 glasses a day.     . Prevent Falls     Recommend placing hand rails and grab bars in and outside home to prevent further falls.        Fall Risk Fall Risk  02/16/2019 02/13/2018 08/15/2017 07/12/2017 06/12/2017  Falls in the past year? 1 1 No (No Data)  (No Data)  Comment - - - Patient denies new/ recent falls Denies new/ recent falls today  Number falls in past yr: 1 1 - - -  Injury with Fall? 0 0 - - -  Comment - - - - -  Risk Factor Category  - - - - -  Risk for fall due to : Impaired mobility;Impaired balance/gait Impaired balance/gait;Impaired vision - - History of fall(s);Impaired balance/gait;Impaired mobility;Impaired vision  Risk for fall due to: Comment - - - - -  Follow up Falls prevention discussed - - Falls prevention discussed Falls prevention discussed   FALL RISK PREVENTION PERTAINING TO THE HOME:  Any stairs in or around the home? Yes  If so, are there any without handrails? No   Home free of loose throw rugs in walkways, pet beds, electrical cords, etc? Yes  Adequate lighting in your home to reduce risk of falls? Yes   ASSISTIVE DEVICES UTILIZED TO PREVENT FALLS:  Life alert? No  Use of a cane, walker or w/c? Yes  Grab bars in the bathroom? Yes  Shower chair or bench in shower? No  Elevated toilet seat or a handicapped toilet? No    TIMED UP AND GO:  Was the test performed? No .    Depression Screen PHQ 2/9 Scores 02/16/2019 02/13/2018 08/15/2017 06/12/2017  PHQ - 2 Score 0 0 0 1  PHQ- 9 Score - - - -    Cognitive Function     6CIT Screen 02/16/2019 01/31/2016  What Year? 0 points 0 points  What month? 0 points 3 points  What time? 0 points 0 points  Count back from 20 0 points 0 points  Months in reverse 4 points 4 points  Repeat phrase 2 points 6 points  Total Score 6 13    Immunization History  Administered Date(s) Administered  . Influenza,inj,Quad PF,6+ Mos 01/01/2014, 02/05/2015, 01/31/2016, 01/09/2017, 01/10/2018  . Pneumococcal Polysaccharide-23 01/09/2017    Qualifies for Shingles Vaccine? Yes . Due for Shingrix. Pt has been advised to call insurance company to determine out of pocket expense. Advised may also receive vaccine at local pharmacy  or Health Dept. Verbalized acceptance and  understanding.  Tdap: Although this vaccine is not a covered service during a Wellness Exam, does the patient still wish to receive this vaccine today?  No .   Flu Vaccine: Up to date   Screening Tests Health Maintenance  Topic Date Due  . OPHTHALMOLOGY EXAM  11/30/1967  . COLONOSCOPY  11/30/2007  . FOOT EXAM  07/24/2017  . HEMOGLOBIN A1C  11/07/2018  . TETANUS/TDAP  02/16/2020 (Originally 11/29/1976)  . INFLUENZA VACCINE  Completed  . PNEUMOCOCCAL POLYSACCHARIDE VACCINE AGE 63-64 HIGH RISK  Completed  . Hepatitis C Screening  Completed  . HIV Screening  Completed   Cancer Screenings:  Colorectal Screening: Currently due however declines colonoscopy order at this time due to not wanting to leave home during Covid-19 pandemic. Pt agrees to complete cologuard kit. Will order today.   Lung Cancer Screening: (Low Dose CT Chest recommended if Age 12-80 years, 30 pack-year currently smoking OR have quit w/in 15years.) does qualify however declines order today.   Additional Screening:  Hepatitis C Screening: Up to date  Dental Screening: Recommended annual dental exams for proper oral hygiene  Community Resource Referral:  CRR required this visit?  No        Plan:  I have personally reviewed and addressed the Medicare Annual Wellness questionnaire and have noted the following in the patient's chart:  A. Medical and social history B. Use of alcohol, tobacco or illicit drugs  C. Current medications and supplements D. Functional ability and status E.  Nutritional status F.  Physical activity G. Advance directives H. List of other physicians I.  Hospitalizations, surgeries, and ER visits in previous 12 months J.  Rebecca such as hearing and vision if needed, cognitive and depression L. Referrals and appointments   In addition, I have reviewed and discussed with patient certain preventive protocols, quality metrics, and best practice recommendations. A written  personalized care plan for preventive services as well as general preventive health recommendations were provided to patient.   Glendora Score, [place]  16/12/9602 Nurse Health Advisor  Nurse Notes: Pt needs a diabetic foot exam and Hgb A1c check at next in office apt. Pt declined colonoscopy referral today but agreed to complete the cologuard kit. Pt to set up an eye exam within the next year.   Reviewed Nurse Health Advisor's note and plan. Was available for consultation. Agree with documentation and recommendations.

## 2019-02-16 ENCOUNTER — Other Ambulatory Visit: Payer: Self-pay

## 2019-02-16 ENCOUNTER — Ambulatory Visit (INDEPENDENT_AMBULATORY_CARE_PROVIDER_SITE_OTHER): Payer: Medicare Other

## 2019-02-16 DIAGNOSIS — Z1211 Encounter for screening for malignant neoplasm of colon: Secondary | ICD-10-CM | POA: Diagnosis not present

## 2019-02-16 DIAGNOSIS — Z0000 Encounter for general adult medical examination without abnormal findings: Secondary | ICD-10-CM

## 2019-02-16 NOTE — Patient Instructions (Addendum)
Patrick Grant , Thank you for taking time to come for your Medicare Wellness Visit. I appreciate your ongoing commitment to your health goals. Please review the following plan we discussed and let me know if I can assist you in the future.   Screening recommendations/referrals: Colonoscopy: Declined colonoscopy referral. Ordered cologuard today.  Recommended yearly ophthalmology/optometry visit for glaucoma screening and checkup Recommended yearly dental visit for hygiene and checkup  Vaccinations: Influenza vaccine: Up to date Tdap vaccine: Pt declines today.  Shingles vaccine: Pt declines today.     Advanced directives: Advance directive discussed with you today. Pt to print forms offline, notarize and bring copy to office to scan into chart.   Conditions/risks identified: Fall risk prevention discussed with you today.   Next appointment: Declined scheduling a CPE at this time. Scheduled next AWV for 02/23/19 @ 11:00 AM.   Preventive Care 40-64 Years, Male Preventive care refers to lifestyle choices and visits with your health care provider that can promote health and wellness. What does preventive care include?  A yearly physical exam. This is also called an annual well check.  Dental exams once or twice a year.  Routine eye exams. Ask your health care provider how often you should have your eyes checked.  Personal lifestyle choices, including:  Daily care of your teeth and gums.  Regular physical activity.  Eating a healthy diet.  Avoiding tobacco and drug use.  Limiting alcohol use.  Practicing safe sex.  Taking low-dose aspirin every day starting at age 53. What happens during an annual well check? The services and screenings done by your health care provider during your annual well check will depend on your age, overall health, lifestyle risk factors, and family history of disease. Counseling  Your health care provider may ask you questions about your:  Alcohol  use.  Tobacco use.  Drug use.  Emotional well-being.  Home and relationship well-being.  Sexual activity.  Eating habits.  Work and work Statistician. Screening  You may have the following tests or measurements:  Height, weight, and BMI.  Blood pressure.  Lipid and cholesterol levels. These may be checked every 5 years, or more frequently if you are over 74 years old.  Skin check.  Lung cancer screening. You may have this screening every year starting at age 72 if you have a 30-pack-year history of smoking and currently smoke or have quit within the past 15 years.  Fecal occult blood test (FOBT) of the stool. You may have this test every year starting at age 4.  Flexible sigmoidoscopy or colonoscopy. You may have a sigmoidoscopy every 5 years or a colonoscopy every 10 years starting at age 16.  Prostate cancer screening. Recommendations will vary depending on your family history and other risks.  Hepatitis C blood test.  Hepatitis B blood test.  Sexually transmitted disease (STD) testing.  Diabetes screening. This is done by checking your blood sugar (glucose) after you have not eaten for a while (fasting). You may have this done every 1-3 years. Discuss your test results, treatment options, and if necessary, the need for more tests with your health care provider. Vaccines  Your health care provider may recommend certain vaccines, such as:  Influenza vaccine. This is recommended every year.  Tetanus, diphtheria, and acellular pertussis (Tdap, Td) vaccine. You may need a Td booster every 10 years.  Zoster vaccine. You may need this after age 76.  Pneumococcal 13-valent conjugate (PCV13) vaccine. You may need this if you have  certain conditions and have not been vaccinated.  Pneumococcal polysaccharide (PPSV23) vaccine. You may need one or two doses if you smoke cigarettes or if you have certain conditions. Talk to your health care provider about which screenings  and vaccines you need and how often you need them. This information is not intended to replace advice given to you by your health care provider. Make sure you discuss any questions you have with your health care provider. Document Released: 04/08/2015 Document Revised: 11/30/2015 Document Reviewed: 01/11/2015 Elsevier Interactive Patient Education  2017 [place] Prevention in the Home Falls can cause injuries. They can happen to people of all ages. There are many things you can do to make your home safe and to help prevent falls. What can I do on the outside of my home?  Regularly fix the edges of walkways and driveways and fix any cracks.  Remove anything that might make you trip as you walk through a door, such as a raised step or threshold.  Trim any bushes or trees on the path to your home.  Use bright outdoor lighting.  Clear any walking paths of anything that might make someone trip, such as rocks or tools.  Regularly check to see if handrails are loose or broken. Make sure that both sides of any steps have handrails.  Any raised decks and porches should have guardrails on the edges.  Have any leaves, snow, or ice cleared regularly.  Use sand or salt on walking paths during winter.  Clean up any spills in your garage right away. This includes oil or grease spills. What can I do in the bathroom?  Use night lights.  Install grab bars by the toilet and in the tub and shower. Do not use towel bars as grab bars.  Use non-skid mats or decals in the tub or shower.  If you need to sit down in the shower, use a plastic, non-slip stool.  Keep the floor dry. Clean up any water that spills on the floor as soon as it happens.  Remove soap buildup in the tub or shower regularly.  Attach bath mats securely with double-sided non-slip rug tape.  Do not have throw rugs and other things on the floor that can make you trip. What can I do in the bedroom?  Use night lights.   Make sure that you have a light by your bed that is easy to reach.  Do not use any sheets or blankets that are too big for your bed. They should not hang down onto the floor.  Have a firm chair that has side arms. You can use this for support while you get dressed.  Do not have throw rugs and other things on the floor that can make you trip. What can I do in the kitchen?  Clean up any spills right away.  Avoid walking on wet floors.  Keep items that you use a lot in easy-to-reach places.  If you need to reach something above you, use a strong step stool that has a grab bar.  Keep electrical cords out of the way.  Do not use floor polish or wax that makes floors slippery. If you must use wax, use non-skid floor wax.  Do not have throw rugs and other things on the floor that can make you trip. What can I do with my stairs?  Do not leave any items on the stairs.  Make sure that there are handrails on both sides of  the stairs and use them. Fix handrails that are broken or loose. Make sure that handrails are as long as the stairways.  Check any carpeting to make sure that it is firmly attached to the stairs. Fix any carpet that is loose or worn.  Avoid having throw rugs at the top or bottom of the stairs. If you do have throw rugs, attach them to the floor with carpet tape.  Make sure that you have a light switch at the top of the stairs and the bottom of the stairs. If you do not have them, ask someone to add them for you. What else can I do to help prevent falls?  Wear shoes that:  Do not have high heels.  Have rubber bottoms.  Are comfortable and fit you well.  Are closed at the toe. Do not wear sandals.  If you use a stepladder:  Make sure that it is fully opened. Do not climb a closed stepladder.  Make sure that both sides of the stepladder are locked into place.  Ask someone to hold it for you, if possible.  Clearly mark and make sure that you can see:  Any  grab bars or handrails.  First and last steps.  Where the edge of each step is.  Use tools that help you move around (mobility aids) if they are needed. These include:  Canes.  Walkers.  Scooters.  Crutches.  Turn on the lights when you go into a dark area. Replace any light bulbs as soon as they burn out.  Set up your furniture so you have a clear path. Avoid moving your furniture around.  If any of your floors are uneven, fix them.  If there are any pets around you, be aware of where they are.  Review your medicines with your doctor. Some medicines can make you feel dizzy. This can increase your chance of falling. Ask your doctor what other things that you can do to help prevent falls. This information is not intended to replace advice given to you by your health care provider. Make sure you discuss any questions you have with your health care provider. Document Released: 01/06/2009 Document Revised: 08/18/2015 Document Reviewed: 04/16/2014 Elsevier Interactive Patient Education  2017 [employer].

## 2019-03-17 LAB — COLOGUARD: Cologuard: POSITIVE — AB

## 2019-04-02 ENCOUNTER — Telehealth: Payer: Self-pay

## 2019-04-02 ENCOUNTER — Encounter: Payer: Self-pay | Admitting: Family Medicine

## 2019-04-02 DIAGNOSIS — R195 Other fecal abnormalities: Secondary | ICD-10-CM

## 2019-04-02 NOTE — Telephone Encounter (Signed)
Referral placed for GI 

## 2019-04-03 ENCOUNTER — Telehealth: Payer: Self-pay

## 2019-04-03 ENCOUNTER — Other Ambulatory Visit: Payer: Self-pay

## 2019-04-03 DIAGNOSIS — R195 Other fecal abnormalities: Secondary | ICD-10-CM

## 2019-04-03 NOTE — Telephone Encounter (Signed)
Called and spoke with patient about his cologuard results. He stated that the GI office has contacted him and set up an appointment at the end of the month. He gave verbal understanding,

## 2019-04-03 NOTE — Telephone Encounter (Signed)
Gastroenterology Pre-Procedure Review  Request Date: 04/17/19 Requesting Physician: Dr. Vicente Males  PATIENT REVIEW QUESTIONS: The patient responded to the following health history questions as indicated:    1. Are you having any GI issues? no (Positive cologuard) 2. Do you have a personal history of Polyps? no 3. Do you have a family history of Colon Cancer or Polyps? yes (father had colon cancer, after having throat cancer) 4. Diabetes Mellitus? yes (type 2) 5. Joint replacements in the past 12 months?no 6. Major health problems in the past 3 months?no 7. Any artificial heart valves, MVP, or defibrillator?no    MEDICATIONS & ALLERGIES:    Patient reports the following regarding taking any anticoagulation/antiplatelet therapy:   Plavix, Coumadin, Eliquis, Xarelto, Lovenox, Pradaxa, Brilinta, or Effient? no Aspirin? no  Patient confirms/reports the following medications:  Current Outpatient Medications  Medication Sig Dispense Refill  . albuterol (PROVENTIL HFA;VENTOLIN HFA) 108 (90 Base) MCG/ACT inhaler Inhale 2 puffs into the lungs every 6 (six) hours as needed for wheezing or shortness of breath. 18 g 3  . empagliflozin (JARDIANCE) 25 MG TABS tablet Take 25 mg by mouth daily. 90 tablet 3  . glucose blood (ONE TOUCH ULTRA TEST) test strip USE 1 STRIP TO CHECK GLUCOSE TWICE DAILY *BEFORE  BREAKFAST  AND  SUPPER* 200 each 4  . lisinopril (ZESTRIL) 10 MG tablet Take 1 tablet (10 mg total) by mouth daily. 90 tablet 3  . metFORMIN (GLUCOPHAGE) 1000 MG tablet TAKE 1 TABLET BY MOUTH TWICE A DAY WITH A MEAL 180 tablet 3  . mometasone-formoterol (DULERA) 100-5 MCG/ACT AERO Inhale 2 puffs into the lungs 2 (two) times daily at 10 AM and 5 PM. 13 g 3  . nitroGLYCERIN (NITROSTAT) 0.4 MG SL tablet Place 1 tablet (0.4 mg total) under the tongue every 5 (five) minutes as needed. 25 tablet 6  . pravastatin (PRAVACHOL) 20 MG tablet Take 1 tablet (20 mg total) by mouth daily. 90 tablet 3  . tiotropium  (SPIRIVA HANDIHALER) 18 MCG inhalation capsule Place 1 capsule (18 mcg total) into inhaler and inhale daily. 90 capsule 3   No current facility-administered medications for this visit.    Patient confirms/reports the following allergies:  Allergies  Allergen Reactions  . Almond (Diagnostic) Swelling    throat  . Fentanyl Other (See Comments)    Other reaction(s): Other (See Comments) Jittery Other Reaction: Other reaction  . Clove Flavor Other (See Comments)    Loss of vision for 1-2 hrs    No orders of the defined types were placed in this encounter.   AUTHORIZATION INFORMATION Primary Insurance: 1D#: Group #:  Secondary Insurance: 1D#: Group #:  SCHEDULE INFORMATION: Date: 04/17/19 Time: Location:[facility]

## 2019-04-15 ENCOUNTER — Other Ambulatory Visit
Admission: RE | Admit: 2019-04-15 | Discharge: 2019-04-15 | Disposition: A | Discharge status: Home / Self Care | Payer: Medicare Other | Source: Ambulatory Visit | Attending: Gastroenterology | Admitting: Gastroenterology

## 2019-04-15 ENCOUNTER — Other Ambulatory Visit: Payer: Self-pay

## 2019-04-15 DIAGNOSIS — Z01812 Encounter for preprocedural laboratory examination: Secondary | ICD-10-CM | POA: Insufficient documentation

## 2019-04-15 DIAGNOSIS — Z20822 Contact with and (suspected) exposure to covid-19: Secondary | ICD-10-CM | POA: Insufficient documentation

## 2019-04-15 LAB — SARS CORONAVIRUS 2 (TAT 6-24 HRS): SARS Coronavirus 2: NEGATIVE

## 2019-04-17 ENCOUNTER — Other Ambulatory Visit: Payer: Self-pay

## 2019-04-17 ENCOUNTER — Ambulatory Visit: Payer: Medicare Other | Admitting: Family

## 2019-04-17 ENCOUNTER — Ambulatory Visit (INDEPENDENT_AMBULATORY_CARE_PROVIDER_SITE_OTHER): Payer: Medicare Other | Admitting: Family Medicine

## 2019-04-17 ENCOUNTER — Encounter: Payer: Self-pay | Admitting: Gastroenterology

## 2019-04-17 ENCOUNTER — Encounter: Payer: Self-pay | Admitting: Family Medicine

## 2019-04-17 ENCOUNTER — Encounter
Admission: RE | Disposition: A | Discharge status: Home / Self Care | Payer: Self-pay | Source: Home / Self Care | Attending: Gastroenterology

## 2019-04-17 ENCOUNTER — Ambulatory Visit
Admission: RE | Admit: 2019-04-17 | Discharge: 2019-04-17 | Disposition: A | Discharge status: Home / Self Care | Payer: Medicare Other | Attending: Gastroenterology | Admitting: Gastroenterology

## 2019-04-17 VITALS — BP 150/94 | HR 85 | Temp 96.2°F | Wt 221.6 lb

## 2019-04-17 DIAGNOSIS — B029 Zoster without complications: Secondary | ICD-10-CM | POA: Insufficient documentation

## 2019-04-17 DIAGNOSIS — Z5309 Procedure and treatment not carried out because of other contraindication: Secondary | ICD-10-CM | POA: Diagnosis not present

## 2019-04-17 DIAGNOSIS — Z800 Family history of malignant neoplasm of digestive organs: Secondary | ICD-10-CM | POA: Diagnosis not present

## 2019-04-17 DIAGNOSIS — E119 Type 2 diabetes mellitus without complications: Secondary | ICD-10-CM | POA: Diagnosis not present

## 2019-04-17 DIAGNOSIS — Z833 Family history of diabetes mellitus: Secondary | ICD-10-CM | POA: Diagnosis not present

## 2019-04-17 DIAGNOSIS — G4730 Sleep apnea, unspecified: Secondary | ICD-10-CM | POA: Diagnosis not present

## 2019-04-17 DIAGNOSIS — Z87891 Personal history of nicotine dependence: Secondary | ICD-10-CM | POA: Diagnosis not present

## 2019-04-17 DIAGNOSIS — Z888 Allergy status to other drugs, medicaments and biological substances status: Secondary | ICD-10-CM | POA: Insufficient documentation

## 2019-04-17 DIAGNOSIS — J449 Chronic obstructive pulmonary disease, unspecified: Secondary | ICD-10-CM | POA: Insufficient documentation

## 2019-04-17 DIAGNOSIS — E785 Hyperlipidemia, unspecified: Secondary | ICD-10-CM | POA: Diagnosis not present

## 2019-04-17 DIAGNOSIS — Z841 Family history of disorders of kidney and ureter: Secondary | ICD-10-CM | POA: Diagnosis not present

## 2019-04-17 DIAGNOSIS — Z7984 Long term (current) use of oral hypoglycemic drugs: Secondary | ICD-10-CM | POA: Diagnosis not present

## 2019-04-17 DIAGNOSIS — I10 Essential (primary) hypertension: Secondary | ICD-10-CM

## 2019-04-17 DIAGNOSIS — R195 Other fecal abnormalities: Secondary | ICD-10-CM | POA: Diagnosis not present

## 2019-04-17 DIAGNOSIS — Z823 Family history of stroke: Secondary | ICD-10-CM | POA: Insufficient documentation

## 2019-04-17 DIAGNOSIS — Z91018 Allergy to other foods: Secondary | ICD-10-CM | POA: Diagnosis not present

## 2019-04-17 DIAGNOSIS — Z79899 Other long term (current) drug therapy: Secondary | ICD-10-CM | POA: Diagnosis not present

## 2019-04-17 DIAGNOSIS — Z8041 Family history of malignant neoplasm of ovary: Secondary | ICD-10-CM | POA: Diagnosis not present

## 2019-04-17 DIAGNOSIS — G894 Chronic pain syndrome: Secondary | ICD-10-CM | POA: Insufficient documentation

## 2019-04-17 HISTORY — DX: Zoster without complications: B02.9

## 2019-04-17 HISTORY — DX: Sleep apnea, unspecified: G47.30

## 2019-04-17 LAB — GLUCOSE, CAPILLARY: Glucose-Capillary: 160 mg/dL — ABNORMAL HIGH (ref 70–99)

## 2019-04-17 SURGERY — COLONOSCOPY WITH PROPOFOL
Anesthesia: General

## 2019-04-17 MED ORDER — VALACYCLOVIR HCL 1 G PO TABS: 1000.0000 mg | ORAL_TABLET | Freq: Three times a day (TID) | ORAL | 0 refills | Status: AC

## 2019-04-17 MED ORDER — PROPOFOL 500 MG/50ML IV EMUL
INTRAVENOUS | Status: AC
  Filled 2019-04-17: qty 50

## 2019-04-17 MED ORDER — SODIUM CHLORIDE 0.9 % IV SOLN
INTRAVENOUS | Status: DC
  Administered 2019-04-17: 10:00:00 1000 mL via INTRAVENOUS

## 2019-04-17 NOTE — H&P (Addendum)
Patrick Bellows, MD [address] [place], [place], [place], [address] [address], [place], [place], [place], [address] Phone: [phone]  Fax: [phone]  Primary Care Physician:  Margo Common, PA   Pre-Procedure History & Physical: HPI:  Patrick Grant is a 64 y.o. male is here for an colonoscopy.   Past Medical History:  Diagnosis Date  . COPD (chronic obstructive pulmonary disease) ([place])   . CRPS (complex regional pain syndrome)   . Diabetes mellitus without complication ([place])   . Hyperlipidemia   . Hypertension   . Sleep apnea     Past Surgical History:  Procedure Laterality Date  . SPINAL CORD STIMULATOR REMOVAL  2010   DUE TO LOSS OF MUSCLE USE WHEN ACTIVATED    Prior to Admission medications   Medication Sig Start Date End Date Taking? Authorizing Provider  albuterol (PROVENTIL HFA;VENTOLIN HFA) 108 (90 Base) MCG/ACT inhaler Inhale 2 puffs into the lungs every 6 (six) hours as needed for wheezing or shortness of breath. 02/11/18   Chrismon, Vickki Muff, PA  empagliflozin (JARDIANCE) 25 MG TABS tablet Take 25 mg by mouth daily. 02/10/18   Chrismon, [name] E, PA  glucose blood (ONE TOUCH ULTRA TEST) test strip USE 1 STRIP TO CHECK GLUCOSE TWICE DAILY *BEFORE  BREAKFAST  AND  SUPPER* 09/19/18   Chrismon, Vickki Muff, PA  lisinopril (ZESTRIL) 10 MG tablet Take 1 tablet (10 mg total) by mouth daily. 07/17/18   Chrismon, Vickki Muff, PA  metFORMIN (GLUCOPHAGE) 1000 MG tablet TAKE 1 TABLET BY MOUTH TWICE A DAY WITH A MEAL 07/17/18   Chrismon, Vickki Muff, PA  mometasone-formoterol (DULERA) 100-5 MCG/ACT AERO Inhale 2 puffs into the lungs 2 (two) times daily at 10 AM and 5 PM. 05/09/17   Chrismon, Vickki Muff, PA  nitroGLYCERIN (NITROSTAT) 0.4 MG SL tablet Place 1 tablet (0.4 mg total) under the tongue every 5 (five) minutes as needed. 04/17/16   Wende Bushy, MD  pravastatin (PRAVACHOL) 20 MG tablet Take 1 tablet (20 mg total) by mouth daily. 07/17/18   Chrismon, Vickki Muff, PA    tiotropium (SPIRIVA HANDIHALER) 18 MCG inhalation capsule Place 1 capsule (18 mcg total) into inhaler and inhale daily. 05/09/17 02/16/19  Chrismon, Vickki Muff, PA    Allergies as of 04/03/2019 - Review Complete 02/16/2019  Allergen Reaction Noted  . Almond (diagnostic) Swelling 05/23/2016  . Fentanyl Other (See Comments) 02/08/2015  . Clove flavor Other (See Comments) 05/23/2016    Family History  Problem Relation Age of Onset  . CVA Mother   . Kidney failure Mother   . Diabetes Mother   . CVA Father   . Kidney failure Father   . Colon cancer Father   . Ovarian cancer Sister   . Diabetes Brother   . Healthy Sister     Social History   Socioeconomic History  . Marital status: Single    Spouse name: Not on file  . Number of children: 0  . Years of education: Not on file  . Highest education level: Some college, no degree  Occupational History  . Occupation: disability  Tobacco Use  . Smoking status: Former Smoker    Years: 25.00    Types: Cigars    Quit date: 03/12/2016    Years since quitting: 3.0  . Smokeless tobacco: Never Used  Substance and Sexual Activity  . Alcohol use: No    Alcohol/week: 0.0 standard drinks  . Drug use: No  . Sexual  activity: Not on file  Other Topics Concern  . Not on file  Social History Narrative  . Not on file   Social Determinants of Health   Financial Resource Strain: Low Risk   . Difficulty of Paying Living Expenses: Not very hard  Food Insecurity:   . Worried About Charity fundraiser in the Last Year: Not on file  . Ran Out of Food in the Last Year: Not on file  Transportation Needs:   . Lack of Transportation (Medical): Not on file  . Lack of Transportation (Non-Medical): Not on file  Physical Activity:   . Days of Exercise per Week: Not on file  . Minutes of Exercise per Session: Not on file  Stress:   . Feeling of Stress : Not on file  Social Connections:   . Frequency of Communication with Friends and Family: Not  on file  . Frequency of Social Gatherings with Friends and Family: Not on file  . Attends Religious Services: Not on file  . Active Member of Clubs or Organizations: Not on file  . Attends Archivist Meetings: Not on file  . Marital Status: Not on file  Intimate Partner Violence:   . Fear of Current or Ex-Partner: Not on file  . Emotionally Abused: Not on file  . Physically Abused: Not on file  . Sexually Abused: Not on file    Review of Systems: See HPI, otherwise negative ROS  Physical Exam: BP (!) 156/92   Pulse 81   Temp (!) 97.3 F (36.3 C) (Skin)   Resp 16   Ht 6\' 1"  (1.854 m)   Wt 95.3 kg   SpO2 100%   BMI 27.71 kg/m  General:   Alert,  pleasant and cooperative in NAD Head:  Normocephalic and atraumatic. Neck:  Supple; no masses or thyromegaly. Lungs:  Clear throughout to auscultation, normal respiratory effort.    Heart:  +S1, +S2, Regular rate and rhythm, No edema. Abdomen:  Soft,Rash over left lower chest along dermatome, erythematous , and nondistended. Normal bowel sounds, without guarding, and without rebound.   Neurologic:  Alert and  oriented x4;  grossly normal neurologically.  Impression/Plan: Patrick Grant is here for an colonoscopy to be performed for positive cologuard.   Procedure will be canceled due to herpes zoster rash that appears fresh. He is advised to contact his PCP for further advise on the zoster rash. Will be rescheduled   Patrick Bellows, MD  04/17/2019, 9:50 AM

## 2019-04-17 NOTE — Patient Instructions (Signed)
Shingles  Shingles, which is also known as herpes zoster, is an infection that causes a painful skin rash and fluid-filled blisters. It is caused by a virus. Shingles only develops in people who:  Have had chickenpox.  Have been given a medicine to protect against chickenpox (have been vaccinated). Shingles is rare in this group. What are the causes? Shingles is caused by varicella-zoster virus (VZV). This is the same virus that causes chickenpox. After a person is exposed to VZV, the virus stays in the body in an inactive (dormant) state. Shingles develops if the virus is reactivated. This can happen many years after the first (initial) exposure to VZV. It is not known what causes this virus to be reactivated. What increases the risk? People who have had chickenpox or received the chickenpox vaccine are at risk for shingles. Shingles infection is more common in people who:  Are older than age 60.  Have a weakened disease-fighting system (immune system), such as people with: ? HIV. ? AIDS. ? Cancer.  Are taking medicines that weaken the immune system, such as transplant medicines.  Are experiencing a lot of stress. What are the signs or symptoms? Early symptoms of this condition include itching, tingling, and pain in an area on your skin. Pain may be described as burning, stabbing, or throbbing. A few days or weeks after early symptoms start, a painful red rash appears. The rash is usually on one side of the body and has a band-like or belt-like pattern. The rash eventually turns into fluid-filled blisters that break open, change into scabs, and dry up in about 2-3 weeks. At any time during the infection, you may also develop:  A fever.  Chills.  A headache.  An upset stomach. How is this diagnosed? This condition is diagnosed with a skin exam. Skin or fluid samples may be taken from the blisters before a diagnosis is made. These samples are examined under a microscope or sent to  a lab for testing. How is this treated? The rash may last for several weeks. There is not a specific cure for this condition. Your health care provider will probably prescribe medicines to help you manage pain, recover more quickly, and avoid long-term problems. Medicines may include:  Antiviral drugs.  Anti-inflammatory drugs.  Pain medicines.  Anti-itching medicines (antihistamines). If the area involved is on your face, you may be referred to a specialist, such as an eye doctor (ophthalmologist) or an ear, nose, and throat (ENT) doctor (otolaryngologist) to help you avoid eye problems, chronic pain, or disability. Follow these instructions at home: Medicines  Take over-the-counter and prescription medicines only as told by your health care provider.  Apply an anti-itch cream or numbing cream to the affected area as told by your health care provider. Relieving itching and discomfort   Apply cold, wet cloths (cold compresses) to the area of the rash or blisters as told by your health care provider.  Cool baths can be soothing. Try adding baking soda or dry oatmeal to the water to reduce itching. Do not bathe in hot water. Blister and rash care  Keep your rash covered with a loose bandage (dressing). Wear loose-fitting clothing to help ease the pain of material rubbing against the rash.  Keep your rash and blisters clean by washing the area with mild soap and cool water as told by your health care provider.  Check your rash every day for signs of infection. Check for: ? More redness, swelling, or pain. ? Fluid   or blood. ? Warmth. ? Pus or a bad smell.  Do not scratch your rash or pick at your blisters. To help avoid scratching: ? Keep your fingernails clean and cut short. ? Wear gloves or mittens while you sleep, if scratching is a problem. General instructions  Rest as told by your health care provider.  Keep all follow-up visits as told by your health care provider. This  is important.  Wash your hands often with soap and water. If soap and water are not available, use hand sanitizer. Doing this lowers your chance of getting a bacterial skin infection.  Before your blisters change into scabs, your shingles infection can cause chickenpox in people who have never had it or have never been vaccinated against it. To prevent this from happening, avoid contact with other people, especially: ? Babies. ? Pregnant women. ? Children who have eczema. ? Elderly people who have transplants. ? People who have chronic illnesses, such as cancer or AIDS. Contact a health care provider if:  Your pain is not relieved with prescribed medicines.  Your pain does not get better after the rash heals.  You have signs of infection in the rash area, such as: ? More redness, swelling, or pain around the rash. ? Fluid or blood coming from the rash. ? The rash area feeling warm to the touch. ? Pus or a bad smell coming from the rash. Get help right away if:  The rash is on your face or nose.  You have facial pain, pain around your eye area, or loss of feeling on one side of your face.  You have difficulty seeing.  You have ear pain or have ringing in your ear.  You have a loss of taste.  Your condition gets worse. Summary  Shingles, which is also known as herpes zoster, is an infection that causes a painful skin rash and fluid-filled blisters.  This condition is diagnosed with a skin exam. Skin or fluid samples may be taken from the blisters and examined before the diagnosis is made.  Keep your rash covered with a loose bandage (dressing). Wear loose-fitting clothing to help ease the pain of material rubbing against the rash.  Before your blisters change into scabs, your shingles infection can cause chickenpox in people who have never had it or have never been vaccinated against it. This information is not intended to replace advice given to you by your health care  provider. Make sure you discuss any questions you have with your health care provider. Document Revised: 07/04/2018 Document Reviewed: 11/14/2016 Elsevier Patient Education  2020 [employer]  

## 2019-04-17 NOTE — Progress Notes (Signed)
Patient: Patrick Grant Male    DOB: [birth year]   64 y.o.   MRN: [id number] Visit Date: 04/17/2019  Today's Provider: Lavon Paganini, MD   Chief Complaint  Patient presents with  . Rash   Subjective:     Rash This is a new problem. The current episode started today. The affected locations include the torso and back. The rash is characterized by redness, burning and swelling. He was exposed to a new detergent/soap and a new medication (prep for colonoscopy). Pertinent negatives include no congestion, cough, diarrhea, fatigue, fever, shortness of breath or sore throat. Past treatments include nothing.   Patient has had chicken pox and mumps when he was younger. Has not had Shingrix.  Was scheduled for colonoscopy this AM, but it was canceled when his rash was discovered.  Allergies  Allergen Reactions  . Almond (Diagnostic) Swelling    throat  . Fentanyl Other (See Comments)    Other reaction(s): Other (See Comments) Jittery Other Reaction: Other reaction  . Clove Flavor Other (See Comments)    Loss of vision for 1-2 hrs     Current Outpatient Medications:  .  albuterol (PROVENTIL HFA;VENTOLIN HFA) 108 (90 Base) MCG/ACT inhaler, Inhale 2 puffs into the lungs every 6 (six) hours as needed for wheezing or shortness of breath., Disp: 18 g, Rfl: 3 .  empagliflozin (JARDIANCE) 25 MG TABS tablet, Take 25 mg by mouth daily., Disp: 90 tablet, Rfl: 3 .  glucose blood (ONE TOUCH ULTRA TEST) test strip, USE 1 STRIP TO CHECK GLUCOSE TWICE DAILY *BEFORE  BREAKFAST  AND  SUPPER*, Disp: 200 each, Rfl: 4 .  lisinopril (ZESTRIL) 10 MG tablet, Take 1 tablet (10 mg total) by mouth daily., Disp: 90 tablet, Rfl: 3 .  metFORMIN (GLUCOPHAGE) 1000 MG tablet, TAKE 1 TABLET BY MOUTH TWICE A DAY WITH A MEAL, Disp: 180 tablet, Rfl: 3 .  mometasone-formoterol (DULERA) 100-5 MCG/ACT AERO, Inhale 2 puffs into the lungs 2 (two) times daily at 10 AM and 5 PM., Disp: 13 g, Rfl: 3 .  nitroGLYCERIN  (NITROSTAT) 0.4 MG SL tablet, Place 1 tablet (0.4 mg total) under the tongue every 5 (five) minutes as needed., Disp: 25 tablet, Rfl: 6 .  pravastatin (PRAVACHOL) 20 MG tablet, Take 1 tablet (20 mg total) by mouth daily., Disp: 90 tablet, Rfl: 3 .  tiotropium (SPIRIVA HANDIHALER) 18 MCG inhalation capsule, Place 1 capsule (18 mcg total) into inhaler and inhale daily., Disp: 90 capsule, Rfl: 3  Review of Systems  Constitutional: Negative for fatigue and fever.  HENT: Negative for congestion and sore throat.   Respiratory: Negative for cough and shortness of breath.   Gastrointestinal: Negative for diarrhea.  Skin: Positive for rash.    Social History   Tobacco Use  . Smoking status: Former Smoker    Years: 25.00    Types: Cigars    Quit date: 03/12/2016    Years since quitting: 3.0  . Smokeless tobacco: Never Used  Substance Use Topics  . Alcohol use: No    Alcohol/week: 0.0 standard drinks      Objective:   There were no vitals taken for this visit. There were no vitals filed for this visit.There is no height or weight on file to calculate BMI.   Physical Exam Constitutional:      General: He is not in acute distress.    Appearance: Normal appearance. He is not diaphoretic.  HENT:     Head: Normocephalic  and atraumatic.  Cardiovascular:     Rate and Rhythm: Normal rate and regular rhythm.  Pulmonary:     Effort: Pulmonary effort is normal. No respiratory distress.  Skin:    Comments: L sided thoracic back and flank rash.  Erythematous base with vesicles.  Neurological:     Mental Status: He is alert and oriented to person, place, and time. Mental status is at baseline.  Psychiatric:        Mood and Affect: Mood normal.        Behavior: Behavior normal.      Results for orders placed or performed during the hospital encounter of 04/17/19  Glucose, capillary  Result Value Ref Range   Glucose-Capillary 160 (H) 70 - 99 mg/dL       Assessment & Plan    1.  Herpes zoster without complication - rash consistent with herpes zoster along a dermatomal line - no evidence of disseminated disease - discussed contact precautions - advised to keep covered - can use lidocaine creams prn - start valtrex 1g TID x7d as he is <72 hours into course - return precautions discussed, including discussion of postherpetic neuralgia and role that gabapentin can have -Return precautions discussed  2. Essential (primary) hypertension -Elevated today, but he has not had his antihypertensive yet this morning and he is stressed about the rash and not having his colonoscopy today -No changes to medications at this time -Follow-up with PCP for regular chronic disease follow-up   Meds ordered this encounter  Medications  . valACYclovir (VALTREX) 1000 MG tablet    Sig: Take 1 tablet (1,000 mg total) by mouth 3 (three) times daily for 7 days.    Dispense:  21 tablet    Refill:  0     Return in about 2 months (around 06/15/2019) for chronic disease f/u with PCP.   The entirety of the information documented in the History of Present Illness, Review of Systems and Physical Exam were personally obtained by me. Portions of this information were initially documented by Johny Shock , CMA and reviewed by me for thoroughness and accuracy.    Christifer Chapdelaine, Dionne Bucy, MD MPH [facility]

## 2019-04-17 NOTE — OR Nursing (Signed)
Patient c/o "hives" on his core.  Dr's Vicente Males and [place] looked at the area and thought it looked like early onset shingles.  Infection control was consulted and it was recommended to cancel the procedure and send the pt to his family physician for treatment.

## 2019-04-20 ENCOUNTER — Other Ambulatory Visit: Payer: Self-pay

## 2019-04-20 ENCOUNTER — Telehealth: Payer: Self-pay

## 2019-04-20 DIAGNOSIS — R195 Other fecal abnormalities: Secondary | ICD-10-CM

## 2019-04-20 NOTE — Addendum Note (Signed)
Addended by: Ulyess Blossom L on: 04/20/2019 10:35 AM   Modules accepted: Orders

## 2019-04-20 NOTE — Telephone Encounter (Signed)
Called and left a message for call back to scheduled colonoscopy.  

## 2019-04-20 NOTE — Telephone Encounter (Signed)
Giving patient samples of sutab. Informed patient procedure would be 05/18/2019 in [place]. Gave new instructions to patient and mailed them to patient

## 2019-04-20 NOTE — Telephone Encounter (Signed)
-----   Message from Jonathon Bellows, MD sent at 04/17/2019 10:05 AM EST ----- Procedure cancelled today as he has shingles- reschedule and give sample of prep

## 2019-04-22 ENCOUNTER — Telehealth: Payer: Self-pay | Admitting: Family Medicine

## 2019-04-22 MED ORDER — GABAPENTIN 300 MG PO CAPS: 300.0000 mg | ORAL_CAPSULE | Freq: Three times a day (TID) | ORAL | 1 refills | Status: DC

## 2019-04-22 NOTE — Telephone Encounter (Signed)
Pt called and stated that he would like something called in for pain. Pt states that he has shingles and can not sleep at night. Pt would like a call back from the nurse if possible. Please advise

## 2019-04-22 NOTE — Telephone Encounter (Signed)
Ok to send in gabapentin 300mg  TID #90 r1 This will help with nerve pain.

## 2019-04-22 NOTE — Telephone Encounter (Signed)
RX sent to [facility].   Pt advised.   Thanks,   -Mickel Baas

## 2019-05-12 ENCOUNTER — Telehealth: Payer: Self-pay | Admitting: Gastroenterology

## 2019-05-12 ENCOUNTER — Encounter: Payer: Self-pay | Admitting: Gastroenterology

## 2019-05-12 ENCOUNTER — Other Ambulatory Visit: Payer: Self-pay
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
## 2019-05-12 NOTE — Telephone Encounter (Signed)
Can you please call patient about procedure

## 2019-05-12 NOTE — Telephone Encounter (Signed)
Pt is calling he  Missed  A call from [place] about rescheduling a procedure he  Wants to see what date he has his procedure and when he needs to go for his Covid test please call pt

## 2019-05-13 ENCOUNTER — Other Ambulatory Visit
Admission: RE | Admit: 2019-05-13 | Discharge: 2019-05-13 | Disposition: A | Discharge status: Home / Self Care | Payer: Medicare Other | Source: Ambulatory Visit | Attending: Gastroenterology | Admitting: Gastroenterology

## 2019-05-13 DIAGNOSIS — Z01812 Encounter for preprocedural laboratory examination: Secondary | ICD-10-CM | POA: Diagnosis not present

## 2019-05-13 DIAGNOSIS — Z20822 Contact with and (suspected) exposure to covid-19: Secondary | ICD-10-CM | POA: Insufficient documentation

## 2019-05-13 LAB — SARS CORONAVIRUS 2 (TAT 6-24 HRS): SARS Coronavirus 2: NEGATIVE

## 2019-05-15 ENCOUNTER — Other Ambulatory Visit: Payer: Medicare Other

## 2019-05-15 NOTE — Anesthesia Preprocedure Evaluation (Addendum)
Anesthesia Evaluation  Patient identified by MRN, date of birth, ID band Patient awake    Reviewed: Allergy & Precautions, NPO status , Patient's Chart, lab work & pertinent test results  History of Anesthesia Complications Negative for: history of anesthetic complications  Airway Mallampati: II  TM Distance: >3 FB Neck ROM: Full    Dental  (+) Partial Lower, Partial Upper,    Pulmonary sleep apnea , COPD, former smoker,    breath sounds clear to auscultation       Cardiovascular hypertension, (-) angina(-) DOE  Rhythm:Regular Rate:Normal   HLD   Neuro/Psych  CRPS  Neuromuscular disease    GI/Hepatic neg GERD  ,  Endo/Other  diabetes, Type 2  Renal/GU      Musculoskeletal   Abdominal   Peds  Hematology   Anesthesia Other Findings   Reproductive/Obstetrics                            Anesthesia Physical Anesthesia Plan  ASA: III  Anesthesia Plan: General   Post-op Pain Management:    Induction: Intravenous  PONV Risk Score and Plan: 2 and Propofol infusion, TIVA and Treatment may vary due to age or medical condition  Airway Management Planned: Natural Airway and Nasal Cannula  Additional Equipment:   Intra-op Plan:   Post-operative Plan:   Informed Consent: I have reviewed the patients History and Physical, chart, labs and discussed the procedure including the risks, benefits and alternatives for the proposed anesthesia with the patient or authorized representative who has indicated his/her understanding and acceptance.       Plan Discussed with: CRNA and Anesthesiologist  Anesthesia Plan Comments:         Anesthesia Quick Evaluation

## 2019-05-15 NOTE — Discharge Instructions (Signed)
General Anesthesia, Adult, Care After This sheet gives you information about how to care for yourself after your procedure. Your health care provider may also give you more specific instructions. If you have problems or questions, contact your health care provider. What can I expect after the procedure? After the procedure, the following side effects are common:  Pain or discomfort at the IV site.  Nausea.  Vomiting.  Sore throat.  Trouble concentrating.  Feeling cold or chills.  Weak or tired.  Sleepiness and fatigue.  Soreness and body aches. These side effects can affect parts of the body that were not involved in surgery. Follow these instructions at home:  For at least 24 hours after the procedure:  Have a responsible adult stay with you. It is important to have someone help care for you until you are awake and alert.  Rest as needed.  Do not: ? Participate in activities in which you could fall or become injured. ? Drive. ? Use heavy machinery. ? Drink alcohol. ? Take sleeping pills or medicines that cause drowsiness. ? Make important decisions or sign legal documents. ? Take care of children on your own. Eating and drinking  Follow any instructions from your health care provider about eating or drinking restrictions.  When you feel hungry, start by eating small amounts of foods that are soft and easy to digest (bland), such as toast. Gradually return to your regular diet.  Drink enough fluid to keep your urine pale yellow.  If you vomit, rehydrate by drinking water, juice, or clear broth. General instructions  If you have sleep apnea, surgery and certain medicines can increase your risk for breathing problems. Follow instructions from your health care provider about wearing your sleep device: ? Anytime you are sleeping, including during daytime naps. ? While taking prescription pain medicines, sleeping medicines, or medicines that make you drowsy.  Return to  your normal activities as told by your health care provider. Ask your health care provider what activities are safe for you.  Take over-the-counter and prescription medicines only as told by your health care provider.  If you smoke, do not smoke without supervision.  Keep all follow-up visits as told by your health care provider. This is important. Contact a health care provider if:  You have nausea or vomiting that does not get better with medicine.  You cannot eat or drink without vomiting.  You have pain that does not get better with medicine.  You are unable to pass urine.  You develop a skin rash.  You have a fever.  You have redness around your IV site that gets worse. Get help right away if:  You have difficulty breathing.  You have chest pain.  You have blood in your urine or stool, or you vomit blood. Summary  After the procedure, it is common to have a sore throat or nausea. It is also common to feel tired.  Have a responsible adult stay with you for the first 24 hours after general anesthesia. It is important to have someone help care for you until you are awake and alert.  When you feel hungry, start by eating small amounts of foods that are soft and easy to digest (bland), such as toast. Gradually return to your regular diet.  Drink enough fluid to keep your urine pale yellow.  Return to your normal activities as told by your health care provider. Ask your health care provider what activities are safe for you. This information is not   intended to replace advice given to you by your health care provider. Make sure you discuss any questions you have with your health care provider. Document Revised: 03/15/2017 Document Reviewed: 10/26/2016 Elsevier Patient Education  2020 Elsevier Inc[employer]  

## 2019-05-18 ENCOUNTER — Ambulatory Visit
Admission: RE | Admit: 2019-05-18 | Discharge: 2019-05-18 | Disposition: A | Discharge status: Home / Self Care | Payer: Medicare Other | Attending: Gastroenterology | Admitting: Gastroenterology

## 2019-05-18 ENCOUNTER — Encounter: Payer: Self-pay | Admitting: Gastroenterology

## 2019-05-18 ENCOUNTER — Ambulatory Visit: Payer: Medicare Other | Admitting: Anesthesiology

## 2019-05-18 ENCOUNTER — Encounter
Admission: RE | Disposition: A | Discharge status: Home / Self Care | Payer: Self-pay | Source: Home / Self Care | Attending: Gastroenterology

## 2019-05-18 ENCOUNTER — Other Ambulatory Visit: Payer: Self-pay

## 2019-05-18 DIAGNOSIS — R195 Other fecal abnormalities: Secondary | ICD-10-CM | POA: Diagnosis not present

## 2019-05-18 DIAGNOSIS — Z79899 Other long term (current) drug therapy: Secondary | ICD-10-CM | POA: Insufficient documentation

## 2019-05-18 DIAGNOSIS — G7089 Other specified myoneural disorders: Secondary | ICD-10-CM | POA: Diagnosis not present

## 2019-05-18 DIAGNOSIS — Z91018 Allergy to other foods: Secondary | ICD-10-CM | POA: Diagnosis not present

## 2019-05-18 DIAGNOSIS — E119 Type 2 diabetes mellitus without complications: Secondary | ICD-10-CM | POA: Insufficient documentation

## 2019-05-18 DIAGNOSIS — E785 Hyperlipidemia, unspecified: Secondary | ICD-10-CM | POA: Insufficient documentation

## 2019-05-18 DIAGNOSIS — Z841 Family history of disorders of kidney and ureter: Secondary | ICD-10-CM | POA: Insufficient documentation

## 2019-05-18 DIAGNOSIS — K635 Polyp of colon: Secondary | ICD-10-CM

## 2019-05-18 DIAGNOSIS — K640 First degree hemorrhoids: Secondary | ICD-10-CM | POA: Diagnosis not present

## 2019-05-18 DIAGNOSIS — Z87891 Personal history of nicotine dependence: Secondary | ICD-10-CM | POA: Insufficient documentation

## 2019-05-18 DIAGNOSIS — G4730 Sleep apnea, unspecified: Secondary | ICD-10-CM | POA: Insufficient documentation

## 2019-05-18 DIAGNOSIS — I10 Essential (primary) hypertension: Secondary | ICD-10-CM | POA: Insufficient documentation

## 2019-05-18 DIAGNOSIS — Z800 Family history of malignant neoplasm of digestive organs: Secondary | ICD-10-CM | POA: Diagnosis not present

## 2019-05-18 DIAGNOSIS — Z8041 Family history of malignant neoplasm of ovary: Secondary | ICD-10-CM | POA: Diagnosis not present

## 2019-05-18 DIAGNOSIS — D122 Benign neoplasm of ascending colon: Secondary | ICD-10-CM | POA: Insufficient documentation

## 2019-05-18 DIAGNOSIS — J449 Chronic obstructive pulmonary disease, unspecified: Secondary | ICD-10-CM | POA: Diagnosis not present

## 2019-05-18 DIAGNOSIS — D126 Benign neoplasm of colon, unspecified: Secondary | ICD-10-CM | POA: Diagnosis not present

## 2019-05-18 DIAGNOSIS — Z833 Family history of diabetes mellitus: Secondary | ICD-10-CM | POA: Insufficient documentation

## 2019-05-18 DIAGNOSIS — Z888 Allergy status to other drugs, medicaments and biological substances status: Secondary | ICD-10-CM | POA: Diagnosis not present

## 2019-05-18 DIAGNOSIS — Z823 Family history of stroke: Secondary | ICD-10-CM | POA: Diagnosis not present

## 2019-05-18 DIAGNOSIS — D125 Benign neoplasm of sigmoid colon: Secondary | ICD-10-CM | POA: Insufficient documentation

## 2019-05-18 DIAGNOSIS — Z9102 Food additives allergy status: Secondary | ICD-10-CM | POA: Diagnosis not present

## 2019-05-18 HISTORY — PX: COLONOSCOPY WITH PROPOFOL: SHX5780

## 2019-05-18 HISTORY — DX: Dizziness and giddiness: R42

## 2019-05-18 HISTORY — DX: Dependence on other enabling machines and devices: Z99.89

## 2019-05-18 HISTORY — PX: POLYPECTOMY: SHX5525

## 2019-05-18 LAB — GLUCOSE, CAPILLARY: Glucose-Capillary: 173 mg/dL — ABNORMAL HIGH (ref 70–99)

## 2019-05-18 SURGERY — COLONOSCOPY WITH PROPOFOL
Anesthesia: General | Site: Rectum

## 2019-05-18 MED ORDER — PROPOFOL 10 MG/ML IV BOLUS
INTRAVENOUS | Status: DC | PRN
  Administered 2019-05-18: 20 mg via INTRAVENOUS
  Administered 2019-05-18: 90 mg via INTRAVENOUS
  Administered 2019-05-18: 30 mg via INTRAVENOUS
  Administered 2019-05-18 (×2): 20 mg via INTRAVENOUS
  Administered 2019-05-18: 40 mg via INTRAVENOUS
  Administered 2019-05-18 (×3): 30 mg via INTRAVENOUS

## 2019-05-18 MED ORDER — ACETAMINOPHEN 10 MG/ML IV SOLN: 1000.0000 mg | Freq: Once | INTRAVENOUS | Status: DC | PRN

## 2019-05-18 MED ORDER — LIDOCAINE HCL (CARDIAC) PF 100 MG/5ML IV SOSY
PREFILLED_SYRINGE | INTRAVENOUS | Status: DC | PRN
  Administered 2019-05-18: 20 mg via INTRAVENOUS

## 2019-05-18 MED ORDER — ONDANSETRON HCL 4 MG/2ML IJ SOLN: 4.0000 mg | Freq: Once | INTRAMUSCULAR | Status: DC | PRN

## 2019-05-18 MED ORDER — LACTATED RINGERS IV SOLN
100.0000 mL/h | INTRAVENOUS | Status: DC
  Administered 2019-05-18: 09:00:00 100 mL/h via INTRAVENOUS

## 2019-05-18 MED ORDER — STERILE WATER FOR IRRIGATION IR SOLN
Status: DC | PRN
  Administered 2019-05-18: 10:00:00 100 mL

## 2019-05-18 SURGICAL SUPPLY — 24 items
CANISTER SUCT 1200ML W/VALVE (MISCELLANEOUS) ×3 IMPLANT
CLIP HMST 235XBRD CATH ROT (MISCELLANEOUS) IMPLANT
CLIP RESOLUTION 360 11X235 (MISCELLANEOUS)
ELECT REM PT RETURN 9FT ADLT (ELECTROSURGICAL)
ELECTRODE REM PT RTRN 9FT ADLT (ELECTROSURGICAL) IMPLANT
FCP ESCP3.2XJMB 240X2.8X (MISCELLANEOUS)
FORCEPS BIOP RAD 4 LRG CAP 4 (CUTTING FORCEPS) IMPLANT
FORCEPS BIOP RJ4 240 W/NDL (MISCELLANEOUS)
FORCEPS ESCP3.2XJMB 240X2.8X (MISCELLANEOUS) IMPLANT
GOWN CVR UNV OPN BCK APRN NK (MISCELLANEOUS) ×2 IMPLANT
GOWN ISOL THUMB LOOP REG UNIV (MISCELLANEOUS) ×4
INJECTOR VARIJECT VIN23 (MISCELLANEOUS) IMPLANT
KIT DEFENDO VALVE AND CONN (KITS) IMPLANT
KIT ENDO PROCEDURE OLY (KITS) ×3 IMPLANT
MARKER SPOT ENDO TATTOO 5ML (MISCELLANEOUS) IMPLANT
PROBE APC STR FIRE (PROBE) IMPLANT
RETRIEVER NET ROTH 2.5X230 LF (MISCELLANEOUS) ×3 IMPLANT
SNARE SHORT THROW 13M SML OVAL (MISCELLANEOUS) ×3 IMPLANT
SNARE SHORT THROW 30M LRG OVAL (MISCELLANEOUS) IMPLANT
SNARE SNG USE RND 15MM (INSTRUMENTS) IMPLANT
SPOT EX ENDOSCOPIC TATTOO (MISCELLANEOUS)
TRAP ETRAP POLY (MISCELLANEOUS) ×3 IMPLANT
VARIJECT INJECTOR VIN23 (MISCELLANEOUS)
WATER STERILE IRR 250ML POUR (IV SOLUTION) ×3 IMPLANT

## 2019-05-18 NOTE — Anesthesia Procedure Notes (Signed)
Procedure Name: MAC Date/Time: 05/18/2019 10:06 AM Performed by: Vanetta Shawl, CRNA Pre-anesthesia Checklist: Patient identified, Emergency Drugs available, Suction available, Timeout performed and Patient being monitored Patient Re-evaluated:Patient Re-evaluated prior to induction Oxygen Delivery Method: Nasal cannula Placement Confirmation: positive ETCO2

## 2019-05-18 NOTE — Transfer of Care (Signed)
Immediate Anesthesia Transfer of Care Note  Patient: Patrick Grant  Procedure(s) Performed: COLONOSCOPY WITH PROPOFOL (N/A Rectum) POLYPECTOMY (N/A Rectum)  Patient Location: PACU  Anesthesia Type: General  Level of Consciousness: awake, alert  and patient cooperative  Airway and Oxygen Therapy: Patient Spontanous Breathing and Patient connected to supplemental oxygen  Post-op Assessment: Post-op Vital signs reviewed, Patient's Cardiovascular Status Stable, Respiratory Function Stable, Patent Airway and No signs of Nausea or vomiting  Post-op Vital Signs: Reviewed and stable  Complications: No apparent anesthesia complications

## 2019-05-18 NOTE — Anesthesia Postprocedure Evaluation (Signed)
Anesthesia Post Note  Patient: Patrick Grant  Procedure(s) Performed: COLONOSCOPY WITH PROPOFOL (N/A Rectum) POLYPECTOMY (N/A Rectum)     Patient location during evaluation: PACU Anesthesia Type: General Level of consciousness: awake and alert Pain management: pain level controlled Vital Signs Assessment: post-procedure vital signs reviewed and stable Respiratory status: spontaneous breathing, nonlabored ventilation, respiratory function stable and patient connected to nasal cannula oxygen Cardiovascular status: blood pressure returned to baseline and stable Postop Assessment: no apparent nausea or vomiting Anesthetic complications: no    Darris Staiger A  Gracious Renken

## 2019-05-18 NOTE — H&P (Signed)
Patrick Bellows, MD [address] [place], [place], [place], [address] [id number], [place], [place], [place], [address] Phone: [phone]  Fax: [phone]  Primary Care Physician:  Margo Common, PA   Pre-Procedure History & Physical: HPI:  Patrick Grant is a 64 y.o. male is here for an colonoscopy.   Past Medical History:  Diagnosis Date  . COPD (chronic obstructive pulmonary disease) ([place])   . CRPS (complex regional pain syndrome)   . Diabetes mellitus without complication ([place])   . Dizzy spells    occasional  . Hyperlipidemia   . Hypertension   . Shingles 04/17/2019  . Sleep apnea   . Uses crutches    or wheelchair    Past Surgical History:  Procedure Laterality Date  . SPINAL CORD STIMULATOR REMOVAL  2010   DUE TO LOSS OF MUSCLE USE WHEN ACTIVATED    Prior to Admission medications   Medication Sig Start Date End Date Taking? Authorizing Provider  albuterol (PROVENTIL HFA;VENTOLIN HFA) 108 (90 Base) MCG/ACT inhaler Inhale 2 puffs into the lungs every 6 (six) hours as needed for wheezing or shortness of breath. 02/11/18  Yes Chrismon, Vickki Muff, PA  empagliflozin (JARDIANCE) 25 MG TABS tablet Take 25 mg by mouth daily. 02/10/18  Yes Chrismon, Vickki Muff, PA  gabapentin (NEURONTIN) 300 MG capsule Take 1 capsule (300 mg total) by mouth 3 (three) times daily. 04/22/19  Yes Bacigalupo, Dionne Bucy, MD  lisinopril (ZESTRIL) 10 MG tablet Take 1 tablet (10 mg total) by mouth daily. 07/17/18  Yes Chrismon, Vickki Muff, PA  metFORMIN (GLUCOPHAGE) 1000 MG tablet TAKE 1 TABLET BY MOUTH TWICE A DAY WITH A MEAL 07/17/18  Yes Chrismon, Vickki Muff, PA  mometasone-formoterol (DULERA) 100-5 MCG/ACT AERO Inhale 2 puffs into the lungs 2 (two) times daily at 10 AM and 5 PM. 05/09/17  Yes Chrismon, Vickki Muff, PA  pravastatin (PRAVACHOL) 20 MG tablet Take 1 tablet (20 mg total) by mouth daily. 07/17/18  Yes Chrismon, Vickki Muff, PA  tiotropium (SPIRIVA HANDIHALER) 18 MCG inhalation capsule  Place 1 capsule (18 mcg total) into inhaler and inhale daily. 05/09/17 05/12/19 Yes Chrismon, Vickki Muff, PA  glucose blood (ONE TOUCH ULTRA TEST) test strip USE 1 STRIP TO CHECK GLUCOSE TWICE DAILY *BEFORE  BREAKFAST  AND  SUPPER* 09/19/18   Chrismon, Vickki Muff, PA  nitroGLYCERIN (NITROSTAT) 0.4 MG SL tablet Place 1 tablet (0.4 mg total) under the tongue every 5 (five) minutes as needed. Patient not taking: Reported on 04/17/2019 04/17/16   Wende Bushy, MD    Allergies as of 04/20/2019 - Review Complete 04/17/2019  Allergen Reaction Noted  . Almond (diagnostic) Swelling 05/23/2016  . Fentanyl Other (See Comments) 02/08/2015  . Clove flavor Other (See Comments) 05/23/2016    Family History  Problem Relation Age of Onset  . CVA Mother   . Kidney failure Mother   . Diabetes Mother   . CVA Father   . Kidney failure Father   . Colon cancer Father   . Ovarian cancer Sister   . Diabetes Brother   . Healthy Sister     Social History   Socioeconomic History  . Marital status: Single    Spouse name: Not on file  . Number of children: 0  . Years of education: Not on file  . Highest education level: Some college, no degree  Occupational History  . Occupation: disability  Tobacco Use  . Smoking status: Former Smoker  Years: 25.00    Types: Cigars    Quit date: 03/12/2016    Years since quitting: 3.1  . Smokeless tobacco: Never Used  Substance and Sexual Activity  . Alcohol use: No    Alcohol/week: 0.0 standard drinks  . Drug use: No  . Sexual activity: Not on file  Other Topics Concern  . Not on file  Social History Narrative  . Not on file   Social Determinants of Health   Financial Resource Strain: Low Risk   . Difficulty of Paying Living Expenses: Not very hard  Food Insecurity:   . Worried About Charity fundraiser in the Last Year: Not on file  . Ran Out of Food in the Last Year: Not on file  Transportation Needs:   . Lack of Transportation (Medical): Not on file  .  Lack of Transportation (Non-Medical): Not on file  Physical Activity:   . Days of Exercise per Week: Not on file  . Minutes of Exercise per Session: Not on file  Stress:   . Feeling of Stress : Not on file  Social Connections:   . Frequency of Communication with Friends and Family: Not on file  . Frequency of Social Gatherings with Friends and Family: Not on file  . Attends Religious Services: Not on file  . Active Member of Clubs or Organizations: Not on file  . Attends Archivist Meetings: Not on file  . Marital Status: Not on file  Intimate Partner Violence:   . Fear of Current or Ex-Partner: Not on file  . Emotionally Abused: Not on file  . Physically Abused: Not on file  . Sexually Abused: Not on file    Review of Systems: See HPI, otherwise negative ROS  Physical Exam: BP (!) 160/83   Pulse 90   Temp (!) 97.5 F (36.4 C)   Resp 16   Ht 6\' 1"  (1.854 m)   Wt 101.2 kg   SpO2 99%   BMI 29.42 kg/m  General:   Alert,  pleasant and cooperative in NAD Head:  Normocephalic and atraumatic. Neck:  Supple; no masses or thyromegaly. Lungs:  Clear throughout to auscultation, normal respiratory effort.    Heart:  +S1, +S2, Regular rate and rhythm, No edema. Abdomen:  Soft, nontender and nondistended. Normal bowel sounds, without guarding, and without rebound.   Neurologic:  Alert and  oriented x4;  grossly normal neurologically.  Impression/Plan: Patrick Grant is here for an colonoscopy to be performed for positive cologuard Risks, benefits, limitations, and alternatives regarding  colonoscopy have been reviewed with the patient.  Questions have been answered.  All parties agreeable.   Patrick Bellows, MD  05/18/2019, 9:57 AM

## 2019-05-18 NOTE — Op Note (Signed)
[facility]astroenterology Patient Name: Patrick Grant Procedure Date: 05/18/2019 10:03 AM MRN: [id number] Account #: [id number] Date of Birth: 1955/07/06 Admit Type: Outpatient Age: 64 Room: [facility] OR ROOM 01 Gender: Male Note Status: Finalized Procedure:             Colonoscopy Indications:           Positive Cologuard test Providers:             Jonathon Bellows MD, MD Referring MD:          Vickki Muff. Chrismon, MD (Referring MD) Medicines:             Monitored Anesthesia Care Complications:         No immediate complications. Procedure:             Pre-Anesthesia Assessment:                        - Prior to the procedure, a History and Physical was                         performed, and patient medications, allergies and                         sensitivities were reviewed. The patient's tolerance                         of previous anesthesia was reviewed.                        - The risks and benefits of the procedure and the                         sedation options and risks were discussed with the                         patient. All questions were answered and informed                         consent was obtained.                        - ASA Grade Assessment: II - A patient with mild                         systemic disease.                        After obtaining informed consent, the colonoscope was                         passed under direct vision. Throughout the procedure,                         the patient's blood pressure, pulse, and oxygen                         saturations were monitored continuously. The was                         introduced through the anus and advanced to  the the                         cecum, identified by the appendiceal orifice. The                         colonoscopy was performed with ease. The patient                         tolerated the procedure well. The quality of the bowel                         preparation was  excellent. Findings:      The perianal and digital rectal examinations were normal.      Non-bleeding internal hemorrhoids were found during retroflexion. The       hemorrhoids were large and Grade I (internal hemorrhoids that do not       prolapse).      A 10 mm polyp was found in the sigmoid colon. The polyp was       semi-pedunculated. The polyp was removed with a hot snare. Resection and       retrieval were complete.      A 6 mm polyp was found in the ascending colon. The polyp was sessile.       The polyp was removed with a cold snare. Resection and retrieval were       complete.      The exam was otherwise without abnormality on direct and retroflexion       views. Impression:            - Non-bleeding internal hemorrhoids.                        - One 10 mm polyp in the sigmoid colon, removed with a                         hot snare. Resected and retrieved.                        - One 6 mm polyp in the ascending colon, removed with                         a cold snare. Resected and retrieved.                        - The examination was otherwise normal on direct and                         retroflexion views. Recommendation:        - Discharge patient to home (with escort).                        - Resume previous diet.                        - Continue present medications.                        - Await pathology results.                        -  Repeat colonoscopy in 3 years for surveillance. Procedure Code(s):     --- Professional ---                        [phone], Colonoscopy, flexible; with removal of                         tumor(s), polyp(s), or other lesion(s) by snare                         technique Diagnosis Code(s):     --- Professional ---                        K63.5, Polyp of colon                        K64.0, First degree hemorrhoids                        R19.5, Other fecal abnormalities CPT copyright 2019 American Medical Association. All rights  reserved. The codes documented in this report are preliminary and upon coder review may  be revised to meet current compliance requirements. Jonathon Bellows, MD Jonathon Bellows MD, MD 05/18/2019 10:26:02 AM This report has been signed electronically. Number of Addenda: 0 Note Initiated On: 05/18/2019 10:03 AM Scope Withdrawal Time: 0 hours 13 minutes 8 seconds  Total Procedure Duration: 0 hours 15 minutes 35 seconds  Estimated Blood Loss:  Estimated blood loss: none.      [facility]

## 2019-05-19 ENCOUNTER — Encounter: Payer: Self-pay | Admitting: *Deleted

## 2019-05-20 LAB — SURGICAL PATHOLOGY

## 2019-05-21 ENCOUNTER — Encounter: Payer: Self-pay | Admitting: Family Medicine

## 2019-06-11 ENCOUNTER — Encounter: Payer: Self-pay | Admitting: Gastroenterology

## 2019-06-12 ENCOUNTER — Other Ambulatory Visit: Payer: Self-pay | Admitting: Family Medicine

## 2019-06-12 DIAGNOSIS — E1100 Type 2 diabetes mellitus with hyperosmolarity without nonketotic hyperglycemic-hyperosmolar coma (NKHHC): Secondary | ICD-10-CM

## 2019-06-15 ENCOUNTER — Ambulatory Visit: Payer: Self-pay | Admitting: Family Medicine

## 2019-06-22 ENCOUNTER — Other Ambulatory Visit: Payer: Self-pay

## 2019-06-22 ENCOUNTER — Ambulatory Visit (INDEPENDENT_AMBULATORY_CARE_PROVIDER_SITE_OTHER): Payer: Medicare Other | Admitting: Family Medicine

## 2019-06-22 ENCOUNTER — Ambulatory Visit: Payer: Medicare Other | Attending: Internal Medicine

## 2019-06-22 ENCOUNTER — Encounter: Payer: Self-pay | Admitting: Family Medicine

## 2019-06-22 VITALS — BP 152/78 | HR 100 | Temp 96.9°F

## 2019-06-22 DIAGNOSIS — E782 Mixed hyperlipidemia: Secondary | ICD-10-CM | POA: Diagnosis not present

## 2019-06-22 DIAGNOSIS — I10 Essential (primary) hypertension: Secondary | ICD-10-CM

## 2019-06-22 DIAGNOSIS — G90522 Complex regional pain syndrome I of left lower limb: Secondary | ICD-10-CM

## 2019-06-22 DIAGNOSIS — Z23 Encounter for immunization: Secondary | ICD-10-CM

## 2019-06-22 DIAGNOSIS — E1100 Type 2 diabetes mellitus with hyperosmolarity without nonketotic hyperglycemic-hyperosmolar coma (NKHHC): Secondary | ICD-10-CM

## 2019-06-22 MED ORDER — EMPAGLIFLOZIN 25 MG PO TABS: 25.0000 mg | ORAL_TABLET | Freq: Every day | ORAL | 3 refills | Status: DC

## 2019-06-22 NOTE — Progress Notes (Signed)
   Covid-19 Vaccination Clinic  Name:  KHIZER TOEWS    MRN: [id number] DOB: [birth year]  06/22/2019  Mr. Reels was observed post Covid-19 immunization for 15 minutes without incident. He was provided with Vaccine Information Sheet and instruction to access the V-Safe system.   Mr. Sego was instructed to call 911 with any severe reactions post vaccine: Marland Kitchen Difficulty breathing  . Swelling of face and throat  . A fast heartbeat  . A bad rash all over body  . Dizziness and weakness   Immunizations Administered    Name Date Dose VIS Date Route   Pfizer COVID-19 Vaccine 06/22/2019  1:18 PM 0.3 mL 03/06/2019 Intramuscular   Manufacturer: [place]   Lot: [id number]   [place]: [id number]

## 2019-06-22 NOTE — Progress Notes (Addendum)
Patrick Grant  MRN: [id number] DOB: 15-Nov-1955  Subjective:  HPI   The patient I is a 64 year old male who has not been seen in over 1 year with the exception of 1 acute visit.  He was last seen for his chronic health on 05/09/18.   Diabetes Mellitus Type II, Follow-up:   Lab Results  Component Value Date   HGBA1C 8.2 (A) 05/09/2018   HGBA1C 7.3 (A) 01/10/2018   HGBA1C 10.8 (H) 06/05/2017   Management changes at that visit were none.  However he had just gotten back on his Metformin and Jardiance.He reports good compliance with treatment.  He is not having side effects.   Home blood sugar records: 106-130  Episodes of hypoglycemia? no   He is due for a diabetic eye exam and foot exam.  Pertinent Labs:    Component Value Date/Time   CHOL 148 01/17/2018 0907   TRIG 112 01/17/2018 0907   HDL 44 01/17/2018 0907   LDLCALC 82 01/17/2018 0907   LDLCALC 86 02/06/2017 0940   CREATININE 1.09 01/17/2018 0907   CREATININE 0.92 02/06/2017 0940    Wt Readings from Last 3 Encounters:  05/18/19 223 lb (101.2 kg)  04/17/19 221 lb 9.6 oz (100.5 kg)  04/17/19 210 lb (95.3 kg)     Hypertension, follow-up:  BP Readings from Last 3 Encounters:  06/22/19 (!) 152/78  05/18/19 110/77  04/17/19 (!) 150/94    Management since that visit includes none. He reports good compliance with treatment. He is not having side effects.  Outside blood pressures are not being checked.   Weight trend: increasing steadily Wt Readings from Last 3 Encounters:  05/18/19 223 lb (101.2 kg)  04/17/19 221 lb 9.6 oz (100.5 kg)  04/17/19 210 lb (95.3 kg)    Patient Active Problem List   Diagnosis Date Noted  . Dizziness 09/02/2017  . Pulmonary emphysema ([place]) 07/05/2017  . Scrotal pain, Right 04/19/2016  . Prostate cancer screening 04/19/2016  . Cervical pain 02/08/2015  . Cervical nerve root disorder 02/08/2015  . CRPS (complex regional pain syndrome), lower limb 02/08/2015  . Bleeding  from the nose 02/08/2015  . Reflex sympathetic dystrophy of lower extremity 02/08/2015  . Type 2 diabetes mellitus ([place]) 02/08/2015  . Combined fat and carbohydrate induced hyperlipemia 04/06/2008  . Elevated fasting blood sugar 10/10/2007  . Essential (primary) hypertension 09/30/2007    Past Medical History:  Diagnosis Date  . COPD (chronic obstructive pulmonary disease) ([place])   . CRPS (complex regional pain syndrome)   . Diabetes mellitus without complication ([place])   . Dizzy spells    occasional  . Hyperlipidemia   . Hypertension   . Shingles 04/17/2019  . Sleep apnea   . Uses crutches    or wheelchair    Social History   Socioeconomic History  . Marital status: Single    Spouse name: Not on file  . Number of children: 0  . Years of education: Not on file  . Highest education level: Some college, no degree  Occupational History  . Occupation: disability  Tobacco Use  . Smoking status: Former Smoker    Years: 25.00    Types: Cigars    Quit date: 03/12/2016    Years since quitting: 3.2  . Smokeless tobacco: Never Used  Substance and Sexual Activity  . Alcohol use: No    Alcohol/week: 0.0 standard drinks  . Drug use: No  . Sexual activity: Not on file  Other Topics  Concern  . Not on file  Social History Narrative  . Not on file   Social Determinants of Health   Financial Resource Strain: Low Risk   . Difficulty of Paying Living Expenses: Not very hard  Food Insecurity:   . Worried About Charity fundraiser in the Last Year:   . Arboriculturist in the Last Year:   Transportation Needs:   . Film/video editor (Medical):   Marland Kitchen Lack of Transportation (Non-Medical):   Physical Activity:   . Days of Exercise per Week:   . Minutes of Exercise per Session:   Stress:   . Feeling of Stress :   Social Connections:   . Frequency of Communication with Friends and Family:   . Frequency of Social Gatherings with Friends and Family:   . Attends Religious  Services:   . Active Member of Clubs or Organizations:   . Attends Archivist Meetings:   Marland Kitchen Marital Status:   Intimate Partner Violence:   . Fear of Current or Ex-Partner:   . Emotionally Abused:   Marland Kitchen Physically Abused:   . Sexually Abused:     Outpatient Encounter Medications as of 06/22/2019  Medication Sig Note  . albuterol (PROVENTIL HFA;VENTOLIN HFA) 108 (90 Base) MCG/ACT inhaler Inhale 2 puffs into the lungs every 6 (six) hours as needed for wheezing or shortness of breath.   . empagliflozin (JARDIANCE) 25 MG TABS tablet Take 25 mg by mouth daily.   Marland Kitchen gabapentin (NEURONTIN) 300 MG capsule Take 1 capsule (300 mg total) by mouth 3 (three) times daily.   Marland Kitchen glucose blood (ONE TOUCH ULTRA TEST) test strip USE 1 STRIP TO CHECK GLUCOSE TWICE DAILY *BEFORE  BREAKFAST  AND  SUPPER*   . lisinopril (ZESTRIL) 10 MG tablet TAKE 1 TABLET (10 MG TOTAL) BY MOUTH DAILY.   . metFORMIN (GLUCOPHAGE) 1000 MG tablet TAKE 1 TABLET BY MOUTH TWICE A DAY WITH A MEAL   . mometasone-formoterol (DULERA) 100-5 MCG/ACT AERO Inhale 2 puffs into the lungs 2 (two) times daily at 10 AM and 5 PM.   . pravastatin (PRAVACHOL) 20 MG tablet TAKE 1 TABLET (20 MG TOTAL) BY MOUTH DAILY.   Marland Kitchen tiotropium (SPIRIVA HANDIHALER) 18 MCG inhalation capsule Place 1 capsule (18 mcg total) into inhaler and inhale daily.   . nitroGLYCERIN (NITROSTAT) 0.4 MG SL tablet Place 1 tablet (0.4 mg total) under the tongue every 5 (five) minutes as needed. (Patient not taking: Reported on 04/17/2019) 06/12/2017: Reports has not needed recently    No facility-administered encounter medications on file as of 06/22/2019.    Allergies  Allergen Reactions  . Almond (Diagnostic) Swelling    throat  . Fentanyl Other (See Comments)    Other reaction(s): Jittery  . Clove Flavor Other (See Comments)    Loss of vision for 1-2 hrs    Review of Systems  Constitutional: Negative for diaphoresis, fever and malaise/fatigue.  HENT: Negative for  congestion, ear pain, sinus pain and sore throat.   Respiratory: Negative for cough, shortness of breath and wheezing.   Cardiovascular: Negative for chest pain, palpitations and leg swelling.  Gastrointestinal: Negative for abdominal pain and diarrhea.  Genitourinary: Negative for frequency.  Musculoskeletal: Negative for myalgias.  Endo/Heme/Allergies: Negative for polydipsia.    Objective:  BP (!) 152/78 (BP Location: Right Arm, Patient Position: Sitting, Cuff Size: Normal)   Pulse 100   Temp (!) 96.9 F (36.1 C) (Skin)   SpO2 98%   Physical  Exam  Constitutional: He is oriented to person, place, and time and well-developed, well-nourished, and in no distress.  HENT:  Head: Normocephalic.  Eyes: Conjunctivae are normal.  Cardiovascular: Normal rate and regular rhythm.  Pulmonary/Chest: Effort normal and breath sounds normal.  Abdominal: Soft. Bowel sounds are normal.  Musculoskeletal:        General: Tenderness present. Normal range of motion.     Cervical back: Neck supple.     Comments: Severe pain in the left foot with the lightest touch or walking.  Neurological: He is alert and oriented to person, place, and time.  Skin: No rash noted.  Psychiatric: Mood, affect and judgment normal.   Diabetic Foot Form - Detailed   Diabetic Foot Exam - detailed Diabetic Foot exam was performed with the following findings: Yes 06/22/2019  9:21 AM  Visual Foot Exam completed.: Yes  Can the patient see the bottom of their feet?: Yes Are the shoes appropriate in style and fit?: Yes Is there swelling or and abnormal foot shape?: No Is there a claw toe deformity?: No Is there elevated skin temparature?: No Is there foot or ankle muscle weakness?: No Normal Range of Motion: Yes Pulse Foot Exam completed.: Yes  Right posterior Tibialias: Present Left posterior Tibialias: Present  Right Dorsalis Pedis: Present Left Dorsalis Pedis: Other  Sensory Foot Exam Completed.: Yes Semmes-Weinstein  Monofilament Test R Site 1-Great Toe: Pos     Comments: No signs of ulcers or calluses on either foot. Unable to palpate the left foot due to Complex Regional Pain Syndrome. Pain is most intense on the dorsum of the foot with lesser pain on the plantar surface. Walks with crutches bearing minimal weight on the left foot.      Assessment and Plan :  1. Type 2 diabetes mellitus with hyperosmolarity without coma, without long-term current use of insulin (HCC) States FBS in the 106-130 range at home. Did not fast today for labs because he will be getting his first COVID vaccination in a couple hours. Foot exam unchanged and encouraged to get annual ophthalmology evaluation. Given refill of Jardiance 25 mg qd and should continue Metformin 1000 mg BID. Continue diabetic diet and check fasting blood sugar daily.  Will plan to get fasting labs in 2-3 days if no side effects from vaccination. Plan follow up pending reports. - CBC with Differential/Platelet - Comprehensive metabolic panel - Hemoglobin A1c - Lipid Panel With LDL/HDL Ratio - empagliflozin (JARDIANCE) 25 MG TABS tablet; Take 25 mg by mouth daily.  Dispense: 90 tablet; Refill: 3  2. Essential (primary) hypertension Increase in systolic pressure despite use of Lisinopril 10 mg qd. Recommend he restrict salt intake and limit caffeine or energy drinks. Check routine labs and monitor BP at home. May need increase in Lisinopril dosage. - CBC with Differential/Platelet - Comprehensive metabolic panel - TSH - Lipid Panel With LDL/HDL Ratio  3. Complex regional pain syndrome type 1 of left lower extremity Essentially unchanged. Onset in 2008 following a fracture in the left foot. Continues to use crutches to walk and unable to wear shoes due to constant severe pain. Recognizes increase in pain level with barometric weather changes. Attempt to use narcotic analgesic in the past was unsuccessful and stopped due to fear of addiction.   4.  Combined fat and carbohydrate induced hyperlipemia Gaining weight over the past couple months. Recommend low fat reduced calorie diet and should exercise as much as tolerated. Recheck labs and continue Pravastatin due to diabetes with  hyperlipidemia. - Comprehensive metabolic panel - TSH - Lipid Panel With LDL/HDL Ratio

## 2019-06-29 DIAGNOSIS — E1100 Type 2 diabetes mellitus with hyperosmolarity without nonketotic hyperglycemic-hyperosmolar coma (NKHHC): Secondary | ICD-10-CM | POA: Diagnosis not present

## 2019-06-29 DIAGNOSIS — E782 Mixed hyperlipidemia: Secondary | ICD-10-CM | POA: Diagnosis not present

## 2019-06-29 DIAGNOSIS — I10 Essential (primary) hypertension: Secondary | ICD-10-CM | POA: Diagnosis not present

## 2019-06-30 LAB — COMPREHENSIVE METABOLIC PANEL
ALT: 24 IU/L (ref 0–44)
AST: 15 IU/L (ref 0–40)
Albumin/Globulin Ratio: 2 (ref 1.2–2.2)
Albumin: 4.7 g/dL (ref 3.8–4.8)
Alkaline Phosphatase: 89 IU/L (ref 39–117)
BUN/Creatinine Ratio: 18 (ref 10–24)
BUN: 17 mg/dL (ref 8–27)
Bilirubin Total: 0.5 mg/dL (ref 0.0–1.2)
CO2: 17 mmol/L — ABNORMAL LOW (ref 20–29)
Calcium: 9.6 mg/dL (ref 8.6–10.2)
Chloride: 100 mmol/L (ref 96–106)
Creatinine, Ser: 0.96 mg/dL (ref 0.76–1.27)
GFR calc Af Amer: 98 mL/min/{1.73_m2} (ref >59)
GFR calc non Af Amer: 85 mL/min/{1.73_m2} (ref >59)
Globulin, Total: 2.3 g/dL (ref 1.5–4.5)
Glucose: 162 mg/dL — ABNORMAL HIGH (ref 65–99)
Potassium: 4.8 mmol/L (ref 3.5–5.2)
Sodium: 134 mmol/L (ref 134–144)
Total Protein: 7 g/dL (ref 6.0–8.5)

## 2019-06-30 LAB — CBC WITH DIFFERENTIAL/PLATELET
Basophils Absolute: 0.1 10*3/uL (ref 0.0–0.2)
Basos: 1 %
EOS (ABSOLUTE): 0.2 10*3/uL (ref 0.0–0.4)
Eos: 2 %
Hematocrit: 43.7 % (ref 37.5–51.0)
Hemoglobin: 14.9 g/dL (ref 13.0–17.7)
Immature Grans (Abs): 0.1 10*3/uL (ref 0.0–0.1)
Immature Granulocytes: 1 %
Lymphocytes Absolute: 2.4 10*3/uL (ref 0.7–3.1)
Lymphs: 25 %
MCH: 30.8 pg (ref 26.6–33.0)
MCHC: 34.1 g/dL (ref 31.5–35.7)
MCV: 90 fL (ref 79–97)
Monocytes Absolute: 0.6 10*3/uL (ref 0.1–0.9)
Monocytes: 6 %
Neutrophils Absolute: 6.2 10*3/uL (ref 1.4–7.0)
Neutrophils: 65 %
Platelets: 353 10*3/uL (ref 150–450)
RBC: 4.84 x10E6/uL (ref 4.14–5.80)
RDW: 12.8 % (ref 11.6–15.4)
WBC: 9.4 10*3/uL (ref 3.4–10.8)

## 2019-06-30 LAB — HEMOGLOBIN A1C
Est. average glucose Bld gHb Est-mCnc: 186 mg/dL
Hgb A1c MFr Bld: 8.1 % — ABNORMAL HIGH (ref 4.8–5.6)

## 2019-06-30 LAB — LIPID PANEL WITH LDL/HDL RATIO
Cholesterol, Total: 168 mg/dL (ref 100–199)
HDL: 43 mg/dL (ref >39)
LDL Chol Calc (NIH): 104 mg/dL — ABNORMAL HIGH (ref 0–99)
LDL/HDL Ratio: 2.4 ratio (ref 0.0–3.6)
Triglycerides: 119 mg/dL (ref 0–149)
VLDL Cholesterol Cal: 21 mg/dL (ref 5–40)

## 2019-06-30 LAB — TSH: TSH: 1.46 u[IU]/mL (ref 0.450–4.500)

## 2019-07-21 ENCOUNTER — Ambulatory Visit: Payer: Medicare Other | Attending: Internal Medicine

## 2019-07-21 DIAGNOSIS — Z23 Encounter for immunization: Secondary | ICD-10-CM

## 2019-07-21 NOTE — Progress Notes (Signed)
   Covid-19 Vaccination Clinic  Name:  Patrick Grant    MRN: [id number] DOB: November 09, 1955  07/21/2019  Patrick Grant was observed post Covid-19 immunization for 15 minutes without incident. He was provided with Vaccine Information Sheet and instruction to access the V-Safe system.   Patrick Grant was instructed to call 911 with any severe reactions post vaccine: Marland Kitchen Difficulty breathing  . Swelling of face and throat  . A fast heartbeat  . A bad rash all over body  . Dizziness and weakness   Immunizations Administered    Name Date Dose VIS Date Route   Pfizer COVID-19 Vaccine 07/21/2019  1:01 PM 0.3 mL 05/20/2018 Intramuscular   Manufacturer: [place]   Lot: [id number]   [place]: [id number]

## 2019-08-05 ENCOUNTER — Telehealth: Payer: Self-pay | Admitting: Family Medicine

## 2019-08-05 NOTE — Chronic Care Management (AMB) (Signed)
  Chronic Care Management   Outreach Note  08/05/2019 Name: SHIV LAUER MRN: [id number] DOB: 09/21/1955  Anette Riedel Nordlund is a 64 y.o. year old male who is a primary care patient of Chrismon, Vickki Muff, [place]. I reached out to [employer] by phone today in response to a referral sent by Mr. Anette Riedel Lahmann's health plan.     An unsuccessful telephone outreach was attempted today. The patient was referred to the case management team for assistance with care management and care coordination.   Follow Up Plan: A HIPPA compliant phone message was left for the patient providing contact information and requesting a return call.  The care management team will reach out to the patient again over the next 7 days.  If patient returns call to provider office, please advise to call [facility] at [place], [place], [place], [place] [address] Direct Dial: [phone] [email or web address]: [email or web address]

## 2019-08-06 NOTE — Chronic Care Management (AMB) (Signed)
  Chronic Care Management   Note  08/06/2019 Name: CONRADO NANCE MRN: [id number] DOB: 09-08-1955  Anette Riedel Diffley is a 64 y.o. year old male who is a primary care patient of Chrismon, Vickki Muff, [place]. I reached out to [employer] by phone today in response to a referral sent by Mr. Anette Riedel Rother's health plan.     Mr. Olheiser was given information about Chronic Care Management services today including:  1. CCM service includes personalized support from designated clinical staff supervised by his physician, including individualized plan of care and coordination with other care providers 2. 24/7 contact phone numbers for assistance for urgent and routine care needs. 3. Service will only be billed when office clinical staff spend 20 minutes or more in a month to coordinate care. 4. Only one practitioner may furnish and bill the service in a calendar month. 5. The patient may stop CCM services at any time (effective at the end of the month) by phone call to the office staff. 6. The patient will be responsible for cost sharing (co-pay) of up to 20% of the service fee (after annual deductible is met).  Patient agreed to services and verbal consent obtained.   Follow up plan: Telephone appointment with care management team member scheduled for:08/21/2019  Noreene Larsson, [place], [place], [place] [address] Direct Dial: [phone] [email or web address]: [email or web address]

## 2019-08-21 ENCOUNTER — Other Ambulatory Visit: Payer: Self-pay

## 2019-08-21 ENCOUNTER — Ambulatory Visit: Payer: Medicare Other | Admitting: Pharmacist

## 2019-08-21 DIAGNOSIS — E1100 Type 2 diabetes mellitus with hyperosmolarity without nonketotic hyperglycemic-hyperosmolar coma (NKHHC): Secondary | ICD-10-CM

## 2019-08-21 DIAGNOSIS — I10 Essential (primary) hypertension: Secondary | ICD-10-CM

## 2019-08-21 NOTE — Chronic Care Management (AMB) (Signed)
Chronic Care Management Pharmacy  Name: Patrick Grant  MRN: [id number] DOB: [birth year]  Chief Complaint/ HPI  Patrick Grant,  64 y.o. , male presents for their Initial CCM visit with the clinical pharmacist via telephone due to COVID-19 Pandemic.  PCP : Margo Common, PA  Their chronic conditions include: DM, HTN, HLD, COPD  Office Visits: 3/29 DM, Chrismon, BP 152/78 P 100, A1c 8.2%, severe pain L foot, Jardiance, weight gain 1/22 zoster, [place], BP 150/94 P 85 Wt 221.5 BMI 29.2, Valtrex 1g tid x 7d  Consult Visit:NA  Medications: Outpatient Encounter Medications as of 08/21/2019  Medication Sig Note  . albuterol (PROVENTIL HFA;VENTOLIN HFA) 108 (90 Base) MCG/ACT inhaler Inhale 2 puffs into the lungs every 6 (six) hours as needed for wheezing or shortness of breath.   . empagliflozin (JARDIANCE) 25 MG TABS tablet Take 25 mg by mouth daily.   Marland Kitchen glucose blood (ONE TOUCH ULTRA TEST) test strip USE 1 STRIP TO CHECK GLUCOSE TWICE DAILY *BEFORE  BREAKFAST  AND  SUPPER*   . lisinopril (ZESTRIL) 10 MG tablet TAKE 1 TABLET (10 MG TOTAL) BY MOUTH DAILY.   . metFORMIN (GLUCOPHAGE) 1000 MG tablet TAKE 1 TABLET BY MOUTH TWICE A DAY WITH A MEAL   . mometasone-formoterol (DULERA) 100-5 MCG/ACT AERO Inhale 2 puffs into the lungs 2 (two) times daily at 10 AM and 5 PM.   . pravastatin (PRAVACHOL) 20 MG tablet TAKE 1 TABLET (20 MG TOTAL) BY MOUTH DAILY.   Marland Kitchen gabapentin (NEURONTIN) 300 MG capsule Take 1 capsule (300 mg total) by mouth 3 (three) times daily. (Patient not taking: Reported on 08/21/2019)   . nitroGLYCERIN (NITROSTAT) 0.4 MG SL tablet Place 1 tablet (0.4 mg total) under the tongue every 5 (five) minutes as needed. (Patient not taking: Reported on 04/17/2019) 06/12/2017: Reports has not needed recently   . tiotropium (SPIRIVA HANDIHALER) 18 MCG inhalation capsule Place 1 capsule (18 mcg total) into inhaler and inhale daily.    No facility-administered encounter medications on  file as of 08/21/2019.      Financial Resource Strain: Low Risk   . Difficulty of Paying Living Expenses: Not very hard   Current Diagnosis/Assessment:  Goals Addressed            This Visit's Progress   . Chronic Care Management       CARE PLAN ENTRY  Current Barriers:  . Chronic Disease Management support, education, and care coordination needs related to Hypertension, Hyperlipidemia, Diabetes, and COPD   Hypertension . Pharmacist Clinical Goal(s): o Over the next 90 days, patient will work with PharmD and providers to achieve BP goal <130/80 . Current regimen:  o Lisinopril 10mg  daily . Interventions: o Increase lisinopril 20mg  daily . Patient self care activities - Over the next 90 days, patient will: o Check BP daily, document, and provide at future appointments o Ensure daily salt intake < 2300 mg/day  Hyperlipidemia . Pharmacist Clinical Goal(s): o Over the next 90 days, patient will work with PharmD and providers to achieve LDL goal < 70 . Current regimen:  o Pravastatin 20mg  at bedtime . Interventions: o Stop pravastatin o Start rosuvastatin 20mg  1 tab by mouth daily . Patient self care activities - Over the next 90 days, patient will: o Take Crestor every day o Show for any scheduled lab visits to track progress  Diabetes . Pharmacist Clinical Goal(s): o Over the next 90 days, patient will work with PharmD and providers to achieve A1c  goal <7% . Current regimen:  o Metformin 1000 mg twice daily with meals o Jardiance 25mg  daily (taken as needed) . Interventions: o Take Jardiance very day . Patient self care activities - Over the next 90 days, patient will: o Check blood sugar once daily, document, and provide at future appointments o Contact provider with any episodes of hypoglycemia o Meet deductible and/or provide financial documents for patient assistance program  COPD . Pharmacist Clinical Goal(s) o Over the next 90 days, patient will work with  PharmD and providers to increase maintenance inhaler use . Current regimen:  o Albuterol rescue inhaler twice daily o Dulera 1 puff twice daily (half dose) o Spiriva occasionally . Interventions: o Patient assistance for Breztri inhaler which doesn't require out-of-pocket spending o Counseled on deductible . Patient self care activities - Over the next 90 days, patient will: o Meet deductible and/or provide proof of income for [facility]  Medication management . Pharmacist Clinical Goal(s): o Over the next 90 days, patient will work with PharmD and providers to maintain optimal medication adherence . Current pharmacy: [facility] mail order . Interventions o Comprehensive medication review performed. o Continue current medication management strategy . Patient self care activities - Over the next 90 days, patient will: o Focus on medication adherence by meeting deductible o Provided proof of income and medication spending o Take medications as prescribed o Report any questions or concerns to PharmD and/or provider(s)  Initial goal documentation        Hypertension   Office blood pressures are  BP Readings from Last 3 Encounters:  06/22/19 (!) 152/78  05/18/19 110/77  04/17/19 (!) 150/94    Patient has failed these meds in the past: NA  Patient checks BP at home infrequently  Patient home BP readings are ranging: NA  We discussed  BP measures normal when light headed Not at goal  Plan  Increase lisinopril 20mg  daily Continue current medications   Hyperlipidemia   Lipid Panel     Component Value Date/Time   CHOL 168 06/29/2019 0855   TRIG 119 06/29/2019 0855   HDL 43 06/29/2019 0855   Byers 104 (H) 06/29/2019 0855   LDLCALC 86 02/06/2017 0940     The 10-year ASCVD risk score (Goff DC Jr., et al., 2013) is: 25.1%   Values used to calculate the score:     Age: 5 years     Sex: Male     Is Non-Hispanic African American: No     Diabetic: Yes     Tobacco  smoker: No     Systolic Blood Pressure: 0000000 mmHg     Is BP treated: Yes     HDL Cholesterol: 43 mg/dL     Total Cholesterol: 168 mg/dL   Patient has failed these meds in past: None Patient is currently uncontrolled on the following medications:  . Pravastatin 20mg  qhs  We discussed:   Not at goal LDL Unsure about Crestor  Plan  D/c pravastatin Start rosuvastatin 20mg  1 tab daily  Diabetes   Recent Relevant Labs: Lab Results  Component Value Date/Time   HGBA1C 8.1 (H) 06/29/2019 08:55 AM   HGBA1C 8.2 (A) 05/09/2018 04:53 PM   HGBA1C 7.3 (A) 01/10/2018 03:04 PM   HGBA1C 10.8 (H) 06/05/2017 10:26 AM   MICROALBUR negative 01/10/2018 03:02 PM   MICROALBUR 20 01/25/2017 01:55 PM     Checking BG: 2x per Day  Recent FBG Readings: 110 - 115 Patient has failed these meds in past: glipizide  Patient is currently uncontrolled on the following medications: metformin, Jardiance  Last diabetic Foot exam: No results found for: HMDIABEYEEXA  Last diabetic Eye exam: No results found for: HMDIABFOOTEX   We discussed:   BG normal when lightheaded Does feel low about 60  Plan Takes Jardiance as needed  Continue current medications   COPD / Asthma / Tobacco   Eosinophil count:   Lab Results  Component Value Date/Time   EOSPCT 2.4 02/06/2017 09:40 AM  %                               Eos (Absolute):  Lab Results  Component Value Date/Time   EOSABS 0.2 06/29/2019 08:55 AM    Tobacco Status:  Social History   Tobacco Use  Smoking Status Former Smoker  . Years: 25.00  . Types: Cigars  . Quit date: 03/12/2016  . Years since quitting: 3.4  Smokeless Tobacco Never Used    Patient has failed these meds in past: NA Patient is currently uncontrolled on the following medications: albuterol, Dulera Using maintenance inhaler regularly? No Frequency of rescue inhaler use:  multiple times per day  We discussed:    Rescue inhaler 1 - 2 times daily  Now doing 1 puff twice  daily Dulera  Spiriva almost gone No dispense history for Spiriva, Dulera,   Plan  Continue current medications  Medication Management   Pt uses [facility] for all medications Uses pill box? Yes Pt endorses 80% compliance  We discussed:  [employer] doing well Takes pravastatin in evening  Chronic regional pain syndrome Was on fentanyl, morphine Almost died, BG 800+, no coma Can't afford Dulera and Jardiance, PAP ran out   NG for stress test, reorder?  Did LIS application but won't qualify No dispense history for Helyn Numbers, Jardiance  Plan  Start gathering proof of income and proof of OOP Continue current medication management strategy   Follow up: 3 month phone visit  Milus Height, PharmD, [place], [place] [phone]

## 2019-08-23 NOTE — Patient Instructions (Addendum)
Visit Information  Goals Addressed            This Visit's Progress   . Chronic Care Management       CARE PLAN ENTRY  Current Barriers:  . Chronic Disease Management support, education, and care coordination needs related to Hypertension, Hyperlipidemia, Diabetes, and COPD   Hypertension . Pharmacist Clinical Goal(s): o Over the next 90 days, patient will work with PharmD and providers to achieve BP goal <130/80 . Current regimen:  o Lisinopril 72m daily . Interventions: o Increase lisinopril 237mdaily . Patient self care activities - Over the next 90 days, patient will: o Check BP daily, document, and provide at future appointments o Ensure daily salt intake < 2300 mg/day  Hyperlipidemia . Pharmacist Clinical Goal(s): o Over the next 90 days, patient will work with PharmD and providers to achieve LDL goal < 70 . Current regimen:  o Pravastatin 2056mt bedtime . Interventions: o Stop pravastatin o Start rosuvastatin 64m44mtab by mouth daily . Patient self care activities - Over the next 90 days, patient will: o Take Crestor every day o Show for any scheduled lab visits to track progress  Diabetes . Pharmacist Clinical Goal(s): o Over the next 90 days, patient will work with PharmD and providers to achieve A1c goal <7% . Current regimen:  o Metformin 1000 mg twice daily with meals o Jardiance 25mg64mly (taken as needed) . Interventions: o Take Jardiance very day . Patient self care activities - Over the next 90 days, patient will: o Check blood sugar once daily, document, and provide at future appointments o Contact provider with any episodes of hypoglycemia o Meet deductible and/or provide financial documents for patient assistance program  COPD . Pharmacist Clinical Goal(s) o Over the next 90 days, patient will work with PharmD and providers to increase maintenance inhaler use . Current regimen:  o Albuterol rescue inhaler twice daily o Dulera 1 puff twice  daily (half dose) o Spiriva occasionally . Interventions: o Patient assistance for Breztri inhaler which doesn't require out-of-pocket spending o Counseled on deductible . Patient self care activities - Over the next 90 days, patient will: o Meet deductible and/or provide proof of income for [facility] . Pharmacist Clinical Goal(s): o Over the next 90 days, patient will work with PharmD and providers to maintain optimal medication adherence . [facility] . Interventions o Comprehensive medication review performed. o Continue current medication management strategy . Patient self care activities - Over the next 90 days, patient will: o Focus on medication adherence by meeting deductible o Provided proof of income and medication spending o Take medications as prescribed o Report any questions or concerns to PharmD and/or provider(s)  Initial goal documentation        Mr. HagedZumsteingiven information about Chronic Care Management services today including:  1. CCM service includes personalized support from designated clinical staff supervised by his physician, including individualized plan of care and coordination with other care providers 2. 24/7 contact phone numbers for assistance for urgent and routine care needs. 3. Standard insurance, coinsurance, copays and deductibles apply for chronic care management only during months in which we provide at least 20 minutes of these services. Most insurances cover these services at 100%, however patients may be responsible for any copay, coinsurance and/or deductible if applicable. This service may help you avoid the need for more expensive face-to-face services. 4. Only one practitioner may furnish and bill the  service in a calendar month. 5. The patient may stop CCM services at any time (effective at the end of the month) by phone call to the office staff.  Patient agreed to services and verbal  consent obtained.   Print copy of patient instructions provided.  Telephone follow up appointment with pharmacy team member scheduled for: 3 months  Patrick Grant, PharmD, BCGP, CTTS Clinical Pharmacist [facility] [phone]  Type 2 Diabetes Mellitus, Self Care, Adult Caring for yourself after you have been diagnosed with type 2 diabetes (type 2 diabetes mellitus) means keeping your blood sugar (glucose) under control with a balance of:  Nutrition.  Exercise.  Lifestyle changes.  Medicines or insulin, if necessary.  Support from your team of health care providers and others. The following information explains what you need to know to manage your diabetes at home. What are the risks? Having diabetes can put you at risk for other long-term (chronic) conditions, such as heart disease and kidney disease. Your health care provider may prescribe medicines to help prevent complications from diabetes. These medicines may include:  Aspirin.  Medicine to lower cholesterol.  Medicine to control blood pressure. How to monitor blood glucose   Check your blood glucose every day, as often as told by your health care provider.  Have your A1c (hemoglobin A1c) level checked two or more times a year, or as often as told by your health care provider. Your health care provider will set individualized treatment goals for you. Generally, the goal of treatment is to maintain the following blood glucose levels:  Before meals (preprandial): 80-130 mg/dL (4.4-7.2 mmol/L).  After meals (postprandial): below 180 mg/dL (10 mmol/L).  A1c level: less than 7%. How to manage hyperglycemia and hypoglycemia Hyperglycemia symptoms Hyperglycemia, also called high blood glucose, occurs when blood glucose is too high. Make sure you know the early signs of hyperglycemia, such as:  Increased thirst.  Hunger.  Feeling very tired.  Needing to urinate more often than usual.  Blurry  vision. Hypoglycemia symptoms Hypoglycemia, also called low blood glucose, occurswith a blood glucose level at or below 70 mg/dL (3.9 mmol/L). The risk for hypoglycemia increases during or after exercise, during sleep, during illness, and when skipping meals or not eating for a long time (fasting). It is important to know the symptoms of hypoglycemia and treat it right away. Always have a 15-gram rapid-acting carbohydrate snack with you to treat low blood glucose. Family members and close friends should also know the symptoms and should understand how to treat hypoglycemia, in case you are not able to treat yourself. Symptoms may include:  Hunger.  Anxiety.  Sweating and feeling clammy.  Confusion.  Dizziness or feeling light-headed.  Sleepiness.  Nausea.  Increased heart rate.  Headache.  Blurry vision.  Irritability.  A change in coordination.  Tingling or numbness around the mouth, lips, or tongue.  Restless sleep.  Fainting.  Seizure. Treating hypoglycemia If you are alert and able to swallow safely, follow the 15:15 rule:  Take 15 grams of a rapid-acting carbohydrate. Talk with your health care provider about how much you should take.  Rapid-acting options include: ? Glucose pills (take 15 grams). ? 6-8 pieces of hard candy. ? 4-6 oz (120-150 mL) of fruit juice. ? 4-6 oz (120-150 mL) of regular (not diet) soda. ? 1 Tbsp (15 mL) honey or sugar.  Check your blood glucose 15 minutes after you take the carbohydrate.  If the repeat blood glucose level is still at  or below 70 mg/dL (3.9 mmol/L), take 15 grams of a carbohydrate again.  If your blood glucose level does not increase above 70 mg/dL (3.9 mmol/L) after 3 tries, seek emergency medical care.  After your blood glucose level returns to normal, eat a meal or a snack within 1 hour. Treating severe hypoglycemia Severe hypoglycemia is when your blood glucose level is at or below 54 mg/dL (3 mmol/L). Severe  hypoglycemia is an emergency. Do not wait to see if the symptoms will go away. Get medical help right away. Call your local emergency services (911 in the [place] If you have severe hypoglycemia and you cannot eat or drink, you may need an injection of glucagon. A family member or close friend should learn how to check your blood glucose and how to give you a glucagon injection. Ask your health care provider if you need to have an emergency glucagon injection kit available. Severe hypoglycemia may need to be treated in a hospital. The treatment may include getting glucose through an IV. You may also need treatment for the cause of your hypoglycemia. Follow these instructions at home: Take diabetes medicines as told  If your health care provider prescribed insulin or diabetes medicines, take them every day.  Do not run out of insulin or other diabetes medicines that you take. Plan ahead so you always have these available.  If you use insulin, adjust your dosage based on how physically active you are and what foods you eat. Your health care provider will tell you how to adjust your dosage. Make healthy food choices  The things that you eat and drink affect your blood glucose and your insulin dosage. Making good choices helps to control your diabetes and prevent other health problems. A healthy meal plan includes eating lean proteins, complex carbohydrates, fresh fruits and vegetables, low-fat dairy products, and healthy fats. Make an appointment to see a diet and nutrition specialist (registered dietitian) to help you create an eating plan that is right for you. Make sure that you:  Follow instructions from your health care provider about eating or drinking restrictions.  Drink enough fluid to keep your urine pale yellow.  Keep a record of the carbohydrates that you eat. Do this by reading food labels and learning the standard serving sizes of foods.  Follow your sick day plan whenever you cannot  eat or drink as usual. Make this plan in advance with your health care provider.  Stay active Exercise regularly, as told by your health care provider. This may include:  Stretching and doing strength exercises, such as yoga or weightlifting, 2 or more times a week.  Doing 150 minutes or more of moderate-intensity or vigorous-intensity exercise each week. This could be brisk walking, biking, or water aerobics. ? Spread out your activity over 3 or more days of the week. ? Do not go more than 2 days in a row without doing some kind of physical activity. When you start a new exercise or activity, work with your health care provider to adjust your insulin, medicines, or food intake as needed. Make healthy lifestyle choices  Do not use any tobacco products, such as cigarettes, chewing tobacco, and e-cigarettes. If you need help quitting, ask your health care provider.  If your health care provider says that alcohol is safe for you, limit alcohol intake to no more than 1 drink per day for nonpregnant women and 2 drinks per day for men. One drink equals 12 oz of beer (355 mL),  5 oz of wine (148 mL), or 1 oz of hard liquor (44 mL).  Learn to manage stress. If you need help with this, ask your health care provider. Care for your body   Keep your immunizations up to date. In addition to getting vaccinations as told by your health care provider, it is recommended that you get vaccinated against the following illnesses: ? The flu (influenza). Get a flu shot every year. ? Pneumonia. ? Hepatitis B.  Schedule an eye exam soon after your diagnosis, and then one time every year after that.  Check your skin and feet every day for cuts, bruises, redness, blisters, or sores. Schedule a foot exam with your health care provider once every year.  Brush your teeth and gums two times a day, and floss one or more times a day. Visit your dentist one or more times every 6 months.  Maintain a healthy  weight. General instructions  Take over-the-counter and prescription medicines only as told by your health care provider.  Share your diabetes management plan with people in your workplace, school, and household.  Carry a medical alert card or wear medical alert jewelry.  Keep all follow-up visits as told by your health care provider. This is important. Questions to ask your health care provider  Do I need to meet with a diabetes educator?  Where can I find a support group for people with diabetes? Where to find more information For more information about diabetes, visit:  American Diabetes Association (ADA): www.diabetes.org  American Association of Diabetes Educators (AADE): www.diabeteseducator.org Summary  Caring for yourself after you have been diagnosed with (type 2 diabetes mellitus) means keeping your blood sugar (glucose) under control with a balance of nutrition, exercise, lifestyle changes, and medicine.  Check your blood glucose every day, as often as told by your health care provider.  Having diabetes can put you at risk for other long-term (chronic) conditions, such as heart disease and kidney disease. Your health care provider may prescribe medicines to help prevent complications from diabetes.  Keep all follow-up visits as told by your health care provider. This is important. This information is not intended to replace advice given to you by your health care provider. Make sure you discuss any questions you have with your health care provider. Document Revised: 09/02/2017 Document Reviewed: 04/15/2015 Elsevier Patient Education  2020 [employer].

## 2019-09-10 ENCOUNTER — Other Ambulatory Visit: Payer: Self-pay | Admitting: Family Medicine

## 2019-09-10 DIAGNOSIS — E1100 Type 2 diabetes mellitus with hyperosmolarity without nonketotic hyperglycemic-hyperosmolar coma (NKHHC): Secondary | ICD-10-CM

## 2019-09-11 ENCOUNTER — Other Ambulatory Visit: Payer: Self-pay | Admitting: Family Medicine

## 2019-09-11 DIAGNOSIS — E1100 Type 2 diabetes mellitus with hyperosmolarity without nonketotic hyperglycemic-hyperosmolar coma (NKHHC): Secondary | ICD-10-CM

## 2019-09-11 MED ORDER — GLUCOSE BLOOD VI STRP: ORAL_STRIP | 4 refills | Status: DC

## 2019-09-16 ENCOUNTER — Telehealth: Payer: Self-pay | Admitting: Family Medicine

## 2019-09-16 ENCOUNTER — Other Ambulatory Visit: Payer: Self-pay | Admitting: *Deleted

## 2019-09-16 DIAGNOSIS — E1100 Type 2 diabetes mellitus with hyperosmolarity without nonketotic hyperglycemic-hyperosmolar coma (NKHHC): Secondary | ICD-10-CM

## 2019-09-16 MED ORDER — ONETOUCH ULTRA BLUE VI STRP: ORAL_STRIP | 4 refills | Status: DC

## 2019-09-16 NOTE — Telephone Encounter (Signed)
Medication Refill - Medication:  glucose blood (ONE TOUCH ULTRA TEST) test strip [[id number]   Preferred Pharmacy (with phone number or street name):  [facility] [address], [place]  [place] [address]  Phone: [phone] Fax: [phone]     Agent: Please be advised that RX refills may take up to 3 business days. We ask that you follow-up with your pharmacy.

## 2019-09-16 NOTE — Telephone Encounter (Signed)
Patient stated that medication was sent to the wrong pharmacy. Patient would like medication sent to [facility] not [place]

## 2019-09-16 NOTE — Telephone Encounter (Signed)
Call to [place] called into pharmacy as prescribed

## 2019-10-09 ENCOUNTER — Telehealth: Payer: Self-pay

## 2019-10-09 MED ORDER — GLUCOSE BLOOD VI STRP: ORAL_STRIP | 12 refills | Status: DC

## 2019-10-09 NOTE — Telephone Encounter (Signed)
That's true,Medicare covers one strip a day. They only cover more than one a day if you are insulin.

## 2019-10-09 NOTE — Telephone Encounter (Signed)
Can a new Rx with directions for pt to check sugar once daily so pt can get his test strips? Please advise. Thanks TNP

## 2019-10-09 NOTE — Telephone Encounter (Signed)
Pt stated that he hasn't been able to get his test strips from [facility] because Medicare will only cover the strips for testing once a day. Pt stated he hasn't been able to test his blood sugar in 2 weeks. Pt stated that he doesn't know if his order needs to be changed or if there is something we can do to get Medicare to cover. Please advise. Thanks TNP

## 2019-10-30 ENCOUNTER — Ambulatory Visit: Payer: Self-pay | Admitting: Family Medicine

## 2019-11-10 ENCOUNTER — Encounter: Payer: Self-pay | Admitting: Family Medicine

## 2019-11-11 ENCOUNTER — Other Ambulatory Visit: Payer: Self-pay | Admitting: *Deleted

## 2019-11-11 DIAGNOSIS — E1100 Type 2 diabetes mellitus with hyperosmolarity without nonketotic hyperglycemic-hyperosmolar coma (NKHHC): Secondary | ICD-10-CM

## 2019-11-11 MED ORDER — GLUCOSE BLOOD VI STRP: ORAL_STRIP | 12 refills | Status: DC

## 2019-11-11 NOTE — Telephone Encounter (Signed)
Resent patient test strips to [facility]. Rx was confirmed by pharmacy that if was received and filled for $3. Patient was advised.

## 2019-11-23 ENCOUNTER — Ambulatory Visit (INDEPENDENT_AMBULATORY_CARE_PROVIDER_SITE_OTHER): Payer: Medicare Other | Admitting: Pharmacist

## 2019-11-23 DIAGNOSIS — E1100 Type 2 diabetes mellitus with hyperosmolarity without nonketotic hyperglycemic-hyperosmolar coma (NKHHC): Secondary | ICD-10-CM

## 2019-11-23 DIAGNOSIS — I10 Essential (primary) hypertension: Secondary | ICD-10-CM

## 2019-11-23 NOTE — Chronic Care Management (AMB) (Signed)
Chronic Care Management Pharmacy  Name: Patrick Grant  MRN: [id number] DOB: 05-03-1955  Chief Complaint/ HPI  Patrick Grant,  64 y.o. , male presents for their Follow-Up CCM visit with the clinical pharmacist via telephone due to COVID-19 Pandemic.  PCP : Margo Common, PA  Their chronic conditions include: DM, HTN, COPD  Office Visits:NA  Consult Visit:NA  Medications: Outpatient Encounter Medications as of 11/23/2019  Medication Sig Note  . albuterol (PROVENTIL HFA;VENTOLIN HFA) 108 (90 Base) MCG/ACT inhaler Inhale 2 puffs into the lungs every 6 (six) hours as needed for wheezing or shortness of breath.   . empagliflozin (JARDIANCE) 25 MG TABS tablet Take 25 mg by mouth daily.   Marland Kitchen gabapentin (NEURONTIN) 300 MG capsule Take 1 capsule (300 mg total) by mouth 3 (three) times daily. (Patient not taking: Reported on 08/21/2019)   . glucose blood test strip Test blood sugar once daily   . lisinopril (ZESTRIL) 10 MG tablet TAKE 1 TABLET EVERY DAY   . metFORMIN (GLUCOPHAGE) 1000 MG tablet TAKE 1 TABLET BY MOUTH TWICE A DAY WITH A MEAL   . mometasone-formoterol (DULERA) 100-5 MCG/ACT AERO Inhale 2 puffs into the lungs 2 (two) times daily at 10 AM and 5 PM.   . nitroGLYCERIN (NITROSTAT) 0.4 MG SL tablet Place 1 tablet (0.4 mg total) under the tongue every 5 (five) minutes as needed. (Patient not taking: Reported on 04/17/2019) 06/12/2017: Reports has not needed recently   . pravastatin (PRAVACHOL) 20 MG tablet TAKE 1 TABLET EVERY DAY   . tiotropium (SPIRIVA HANDIHALER) 18 MCG inhalation capsule Place 1 capsule (18 mcg total) into inhaler and inhale daily.    No facility-administered encounter medications on file as of 11/23/2019.      Financial Resource Strain: Medium Risk  . Difficulty of Paying Living Expenses: Somewhat hard    Current Diagnosis/Assessment:  Goals Addressed            This Visit's Progress   . Chronic Care Management       CARE PLAN  ENTRY  Current Barriers:  . Chronic Disease Management support, education, and care coordination needs related to Hypertension, Hyperlipidemia, Diabetes, and COPD   Hypertension . Pharmacist Clinical Goal(s): o Over the next 90 days, patient will work with PharmD and providers to achieve BP goal <140/90 . Current regimen:  o Lisinopril 15m daily . Interventions: o Increase lisinopril 232mdaily (not ordered) . Patient self care activities - Over the next 90 days, patient will: o Check BP daily, document, and provide at future appointments o Ensure daily salt intake < 2300 mg/day  Hyperlipidemia . Pharmacist Clinical Goal(s): o Over the next 90 days, patient will work with PharmD and providers to achieve LDL goal < 100 . Current regimen:  o Pravastatin 206mt bedtime . Interventions: o Stop pravastatin o Start rosuvastatin 31m69mtab by mouth daily (not ordered) . Patient self care activities - Over the next 90 days, patient will: o Take Crestor every day o Show for any scheduled lab visits to track progress  Diabetes . Pharmacist Clinical Goal(s): o Over the next 90 days, patient will work with PharmD and providers to achieve A1c goal <7% . Current regimen:  o Metformin 1000 mg twice daily with meals o Jardiance 25mg11mly (taken as needed) . Interventions: o Take Jardiance every day . Patient self care activities - Over the next 90 days, patient will: o Check blood sugar once daily, document, and provide at future  appointments o Contact provider with any episodes of hypoglycemia o Meet deductible and/or provide financial documents for patient assistance program  COPD . Pharmacist Clinical Goal(s) o Over the next 90 days, patient will work with PharmD and providers to increase maintenance inhaler use . Current regimen:  o Albuterol rescue inhaler twice daily o Dulera 1 puff twice daily (half dose) o Spiriva occasionally . Interventions: o Patient assistance for  Breztri inhaler which doesn't require out-of-pocket spending (financial documents not provided) o Counseled on deductible . Patient self care activities - Over the next 90 days, patient will: o Meet deductible and/or provide proof of income for [facility]  Medication management . Pharmacist Clinical Goal(s): o Over the next 90 days, patient will work with PharmD and providers to maintain optimal medication adherence . Current pharmacy: [facility] mail order . Interventions o Comprehensive medication review performed. o Continue current medication management strategy . Patient self care activities - Over the next 90 days, patient will: o Focus on medication adherence by meeting deductible o Provided proof of income and medication spending o Take medications as prescribed o Report any questions or concerns to PharmD and/or provider(s) o Reconnect with Dr. Natale Milch and continue care  Initial goal documentation       Hypertension   BP goal is:  <140/90  Office blood pressures are  BP Readings from Last 3 Encounters:  06/22/19 (!) 152/78  05/18/19 110/77  04/17/19 (!) 150/94   Patient checks BP at home infrequently Patient home BP readings are ranging: NA  Patient has failed these meds in the past: NA Patient is currently uncontrolled on the following medications:  . Lisinopril 53m daily  We discussed  Lisinopril inc? No  Plan  Continue current medications   Hyperlipidemia   LDL goal < 100  Lipid Panel     Component Value Date/Time   CHOL 168 06/29/2019 0855   TRIG 119 06/29/2019 0855   HDL 43 06/29/2019 0855   LDLCALC 104 (H) 06/29/2019 0855   LDLCALC 86 02/06/2017 0940    Hepatic Function Latest Ref Rng & Units 06/29/2019 01/17/2018 07/23/2017  Total Protein 6.0 - 8.5 g/dL 7.0 6.5 7.6  Albumin 3.8 - 4.8 g/dL 4.7 4.6 5.2  AST 0 - 40 IU/L 15 23 21   ALT 0 - 44 IU/L 24 26 32  Alk Phosphatase 39 - 117 IU/L 89 86 84  Total Bilirubin 0.0 - 1.2 mg/dL 0.5 0.5 0.4      The 10-year ASCVD risk score (Mikey BussingDC Jr., et al., 2013) is: 25.1%   Values used to calculate the score:     Age: [age]years     Sex: Male     Is Non-Hispanic African American: No     Diabetic: Yes     Tobacco smoker: No     Systolic Blood Pressure: 1833mmHg     Is BP treated: Yes     HDL Cholesterol: 43 mg/dL     Total Cholesterol: 168 mg/dL   Patient has failed these meds in past: NA Patient is currently uncontrolled on the following medications:  . Pravastatin 262mdaily  We discussed:   Change to rosuvastatin? No Generally lost touch with PCP  Plan  Continue current medications    Diabetes   Recent Relevant Labs: Lab Results  Component Value Date/Time   HGBA1C 8.1 (H) 06/29/2019 08:55 AM   HGBA1C 8.2 (A) 05/09/2018 04:53 PM   HGBA1C 7.3 (A) 01/10/2018 03:04 PM   HGBA1C 10.8 (H) 06/05/2017  10:26 AM   MICROALBUR negative 01/10/2018 03:02 PM   MICROALBUR 20 01/25/2017 01:55 PM     Checking BG: 2x per Day  Recent FBG Readings: Recent pre-meal BG readings: 100 - 219 Patient has failed these meds in past: NA Patient is currently uncontrolled on the following medications: metformin, Jariance PRN  Last diabetic Foot exam: No results found for: HMDIABEYEEXA  Last diabetic Eye exam: No results found for: HMDIABFOOTEX   We discussed: Taking Jardiance routinely? PRN Got up to 468 Appointment with Chrismon got canceled due to vacation?  Plan  Switch to Relion meter and test strips Buy Jardiance, meet deductible, and take every day Continue current medications  Medication Management   Pt uses [facility] for all medications Uses pill box? Yes Pt endorses 80% compliance  We discussed:  Response from LIS application? Denied Took several months to get test strip Rx. Sent to wrong pharmacy Hasn't met deductible  Plan   Continue current medication management strategy  Follow up: 3 month phone visit  Milus Height, PharmD, [place], [place] [phone]

## 2019-11-24 NOTE — Patient Instructions (Addendum)
Visit Information  Goals Addressed            This Visit's Progress   . Chronic Care Management       CARE PLAN ENTRY  Current Barriers:  . Chronic Disease Management support, education, and care coordination needs related to Hypertension, Hyperlipidemia, Diabetes, and COPD   Hypertension . Pharmacist Clinical Goal(s): o Over the next 90 days, patient will work with PharmD and providers to achieve BP goal <140/90 . Current regimen:  o Lisinopril 10mg  daily . Interventions: o Increase lisinopril 20mg  daily (not ordered) . Patient self care activities - Over the next 90 days, patient will: o Check BP daily, document, and provide at future appointments o Ensure daily salt intake < 2300 mg/day  Hyperlipidemia . Pharmacist Clinical Goal(s): o Over the next 90 days, patient will work with PharmD and providers to achieve LDL goal < 100 . Current regimen:  o Pravastatin 20mg  at bedtime . Interventions: o Stop pravastatin o Start rosuvastatin 20mg  1 tab by mouth daily (not ordered) . Patient self care activities - Over the next 90 days, patient will: o Take Crestor every day o Show for any scheduled lab visits to track progress  Diabetes . Pharmacist Clinical Goal(s): o Over the next 90 days, patient will work with PharmD and providers to achieve A1c goal <7% . Current regimen:  o Metformin 1000 mg twice daily with meals o Jardiance 25mg  daily (taken as needed) . Interventions: o Take Jardiance every day . Patient self care activities - Over the next 90 days, patient will: o Check blood sugar once daily, document, and provide at future appointments o Contact provider with any episodes of hypoglycemia o Meet deductible and/or provide financial documents for patient assistance program  COPD . Pharmacist Clinical Goal(s) o Over the next 90 days, patient will work with PharmD and providers to increase maintenance inhaler use . Current regimen:  o Albuterol rescue inhaler  twice daily o Dulera 1 puff twice daily (half dose) o Spiriva occasionally . Interventions: o Patient assistance for Breztri inhaler which doesn't require out-of-pocket spending (financial documents not provided) o Counseled on deductible . Patient self care activities - Over the next 90 days, patient will: o Meet deductible and/or provide proof of income for [facility]  Medication management . Pharmacist Clinical Goal(s): o Over the next 90 days, patient will work with PharmD and providers to maintain optimal medication adherence . Current pharmacy: [facility] mail order . Interventions o Comprehensive medication review performed. o Continue current medication management strategy . Patient self care activities - Over the next 90 days, patient will: o Focus on medication adherence by meeting deductible o Provided proof of income and medication spending o Take medications as prescribed o Report any questions or concerns to PharmD and/or provider(s) o Reconnect with Dr. Natale Milch and continue care  Initial goal documentation        Print copy of patient instructions provided.   Telephone follow up appointment with pharmacy team member scheduled for: 3 months  Milus Height, PharmD, [place], CTTS Clinical Pharmacist [facility] [phone]   Diabetes Basics  Diabetes (diabetes mellitus) is a long-term (chronic) disease. It occurs when the body does not properly use sugar (glucose) that is released from food after you eat. Diabetes may be caused by one or both of these problems:  Your pancreas does not make enough of a hormone called insulin.  Your body does not react in a normal way to insulin that it makes. Insulin  lets sugars (glucose) go into cells in your body. This gives you energy. If you have diabetes, sugars cannot get into cells. This causes high blood sugar (hyperglycemia). Follow these instructions at home: How is diabetes treated? You may need to take  insulin or other diabetes medicines daily to keep your blood sugar in balance. Take your diabetes medicines every day as told by your doctor. List your diabetes medicines here: Diabetes medicines  Name of medicine: ______________________________ ? Amount (dose): _______________ Time (a.m./p.m.): _______________ Notes: ___________________________________  Name of medicine: ______________________________ ? Amount (dose): _______________ Time (a.m./p.m.): _______________ Notes: ___________________________________  Name of medicine: ______________________________ ? Amount (dose): _______________ Time (a.m./p.m.): _______________ Notes: ___________________________________ If you use insulin, you will learn how to give yourself insulin by injection. You may need to adjust the amount based on the food that you eat. List the types of insulin you use here: Insulin  Insulin type: ______________________________ ? Amount (dose): _______________ Time (a.m./p.m.): _______________ Notes: ___________________________________  Insulin type: ______________________________ ? Amount (dose): _______________ Time (a.m./p.m.): _______________ Notes: ___________________________________  Insulin type: ______________________________ ? Amount (dose): _______________ Time (a.m./p.m.): _______________ Notes: ___________________________________  Insulin type: ______________________________ ? Amount (dose): _______________ Time (a.m./p.m.): _______________ Notes: ___________________________________  Insulin type: ______________________________ ? Amount (dose): _______________ Time (a.m./p.m.): _______________ Notes: ___________________________________ How do I manage my blood sugar?  Check your blood sugar levels using a blood glucose monitor as directed by your doctor. Your doctor will set treatment goals for you. Generally, you should have these blood sugar levels:  Before meals (preprandial): 80-130 mg/dL  (4.4-7.2 mmol/L).  After meals (postprandial): below 180 mg/dL (10 mmol/L).  A1c level: less than 7%. Write down the times that you will check your blood sugar levels: Blood sugar checks  Time: _______________ Notes: ___________________________________  Time: _______________ Notes: ___________________________________  Time: _______________ Notes: ___________________________________  Time: _______________ Notes: ___________________________________  Time: _______________ Notes: ___________________________________  Time: _______________ Notes: ___________________________________  What do I need to know about low blood sugar? Low blood sugar is called hypoglycemia. This is when blood sugar is at or below 70 mg/dL (3.9 mmol/L). Symptoms may include:  Feeling: ? Hungry. ? Worried or nervous (anxious). ? Sweaty and clammy. ? Confused. ? Dizzy. ? Sleepy. ? Sick to your stomach (nauseous).  Having: ? A fast heartbeat. ? A headache. ? A change in your vision. ? Tingling or no feeling (numbness) around the mouth, lips, or tongue. ? Jerky movements that you cannot control (seizure).  Having trouble with: ? Moving (coordination). ? Sleeping. ? Passing out (fainting). ? Getting upset easily (irritability). Treating low blood sugar To treat low blood sugar, eat or drink something sugary right away. If you can think clearly and swallow safely, follow the 15:15 rule:  Take 15 grams of a fast-acting carb (carbohydrate). Talk with your doctor about how much you should take.  Some fast-acting carbs are: ? Sugar tablets (glucose pills). Take 3-4 glucose pills. ? 6-8 pieces of hard candy. ? 4-6 oz (120-150 mL) of fruit juice. ? 4-6 oz (120-150 mL) of regular (not diet) soda. ? 1 Tbsp (15 mL) honey or sugar.  Check your blood sugar 15 minutes after you take the carb.  If your blood sugar is still at or below 70 mg/dL (3.9 mmol/L), take 15 grams of a carb again.  If your blood  sugar does not go above 70 mg/dL (3.9 mmol/L) after 3 tries, get help right away.  After your blood sugar goes back to normal, eat a meal or a snack within  1 hour. Treating very low blood sugar If your blood sugar is at or below 54 mg/dL (3 mmol/L), you have very low blood sugar (severe hypoglycemia). This is an emergency. Do not wait to see if the symptoms will go away. Get medical help right away. Call your local emergency services (911 in the [place] Do not drive yourself to the hospital. Questions to ask your health care provider  Do I need to meet with a diabetes educator?  What equipment will I need to care for myself at home?  What diabetes medicines do I need? When should I take them?  How often do I need to check my blood sugar?  What number can I call if I have questions?  When is my next doctor's visit?  Where can I find a support group for people with diabetes? Where to find more information  American Diabetes Association: www.diabetes.org  American Association of Diabetes Educators: www.diabeteseducator.org/patient-resources Contact a doctor if:  Your blood sugar is at or above 240 mg/dL (13.3 mmol/L) for 2 days in a row.  You have been sick or have had a fever for 2 days or more, and you are not getting better.  You have any of these problems for more than 6 hours: ? You cannot eat or drink. ? You feel sick to your stomach (nauseous). ? You throw up (vomit). ? You have watery poop (diarrhea). Get help right away if:  Your blood sugar is lower than 54 mg/dL (3 mmol/L).  You get confused.  You have trouble: ? Thinking clearly. ? Breathing. Summary  Diabetes (diabetes mellitus) is a long-term (chronic) disease. It occurs when the body does not properly use sugar (glucose) that is released from food after digestion.  Take insulin and diabetes medicines as told.  Check your blood sugar every day, as often as told.  Keep all follow-up visits as told by your  doctor. This is important. This information is not intended to replace advice given to you by your health care provider. Make sure you discuss any questions you have with your health care provider. Document Revised: 12/03/2018 Document Reviewed: 06/14/2017 Elsevier Patient Education  [place].

## 2019-12-03 ENCOUNTER — Ambulatory Visit: Payer: Medicare Other | Admitting: Family Medicine

## 2019-12-15 ENCOUNTER — Other Ambulatory Visit: Payer: Self-pay

## 2019-12-15 ENCOUNTER — Encounter: Payer: Self-pay | Admitting: Family Medicine

## 2019-12-15 ENCOUNTER — Ambulatory Visit (INDEPENDENT_AMBULATORY_CARE_PROVIDER_SITE_OTHER): Payer: Medicare Other | Admitting: Family Medicine

## 2019-12-15 VITALS — BP 126/98 | HR 93 | Temp 98.7°F | Wt 224.0 lb

## 2019-12-15 DIAGNOSIS — D489 Neoplasm of uncertain behavior, unspecified: Secondary | ICD-10-CM | POA: Diagnosis not present

## 2019-12-15 DIAGNOSIS — G90522 Complex regional pain syndrome I of left lower limb: Secondary | ICD-10-CM | POA: Diagnosis not present

## 2019-12-15 DIAGNOSIS — I10 Essential (primary) hypertension: Secondary | ICD-10-CM

## 2019-12-15 DIAGNOSIS — Z23 Encounter for immunization: Secondary | ICD-10-CM

## 2019-12-15 DIAGNOSIS — E1100 Type 2 diabetes mellitus with hyperosmolarity without nonketotic hyperglycemic-hyperosmolar coma (NKHHC): Secondary | ICD-10-CM

## 2019-12-15 DIAGNOSIS — E782 Mixed hyperlipidemia: Secondary | ICD-10-CM | POA: Diagnosis not present

## 2019-12-15 NOTE — Addendum Note (Signed)
Addended by: Althea Charon D on: 12/15/2019 12:18 PM   Modules accepted: Orders

## 2019-12-15 NOTE — Progress Notes (Signed)
Established patient visit   Patient: Patrick Grant   DOB: 07-14-1955   64 y.o. Male  MRN: [id number] Visit Date: 12/15/2019  Today's healthcare provider: Vernie Murders, PA   No chief complaint on file.  Subjective    HPI   Diabetes Mellitus Type II, Follow-up  Lab Results  Component Value Date   HGBA1C 8.1 (H) 06/29/2019   HGBA1C 8.2 (A) 05/09/2018   HGBA1C 7.3 (A) 01/10/2018   Wt Readings from Last 3 Encounters:  12/15/19 224 lb (101.6 kg)  05/18/19 223 lb (101.2 kg)  04/17/19 221 lb 9.6 oz (100.5 kg)   Last seen for diabetes 6 months ago.  Management since then includes none. He reports good compliance with treatment. He is not having side effects.  Symptoms: No fatigue No foot ulcerations  No appetite changes No nausea  No paresthesia of the feet  No polydipsia  No polyuria No visual disturbances   No vomiting     Home blood sugar records: 140-300  Episodes of hypoglycemia? No    Current insulin regiment: none Most Recent Eye Exam: over a year ago Current exercise: no regular exercise Current diet habits: diabetic  Pertinent Labs: Lab Results  Component Value Date   CHOL 168 06/29/2019   HDL 43 06/29/2019   LDLCALC 104 (H) 06/29/2019   TRIG 119 06/29/2019   CHOLHDL 3.4 01/17/2018   Lab Results  Component Value Date   NA 134 06/29/2019   K 4.8 06/29/2019   CREATININE 0.96 06/29/2019   GFRNONAA 85 06/29/2019   GFRAA 98 06/29/2019   GLUCOSE 162 (H) 06/29/2019     ---------------------------------------------------------------------------------------------------   Past Medical History:  Diagnosis Date  . COPD (chronic obstructive pulmonary disease) ([place])   . CRPS (complex regional pain syndrome)   . Diabetes mellitus without complication ([place])   . Dizzy spells    occasional  . Hyperlipidemia   . Hypertension   . Shingles 04/17/2019  . Sleep apnea   . Uses crutches    or wheelchair   Past Surgical History:  Procedure  Laterality Date  . COLONOSCOPY WITH PROPOFOL N/A 05/18/2019   Procedure: COLONOSCOPY WITH PROPOFOL;  Surgeon: Jonathon Bellows, MD;  Location: [facility];  Service: Endoscopy;  Laterality: N/A;  Diabetic - oral meds sleep apnea  . POLYPECTOMY N/A 05/18/2019   Procedure: POLYPECTOMY;  Surgeon: Jonathon Bellows, MD;  Location: [facility];  Service: Endoscopy;  Laterality: N/A;  . SPINAL CORD STIMULATOR REMOVAL  2010   DUE TO LOSS OF MUSCLE USE WHEN ACTIVATED   Social History   Tobacco Use  . Smoking status: Former Smoker    Years: 25.00    Types: Cigars    Quit date: 03/12/2016    Years since quitting: 3.7  . Smokeless tobacco: Never Used  Vaping Use  . Vaping Use: Never used  Substance Use Topics  . Alcohol use: No    Alcohol/week: 0.0 standard drinks  . Drug use: No   Family History  Problem Relation Age of Onset  . CVA Mother   . Kidney failure Mother   . Diabetes Mother   . CVA Father   . Kidney failure Father   . Colon cancer Father   . Ovarian cancer Sister   . Diabetes Brother   . Healthy Sister    Allergies  Allergen Reactions  . Almond (Diagnostic) Swelling    throat  . Fentanyl Other (See Comments)    Other reaction(s): Jittery  .  Clove Flavor Other (See Comments)    Loss of vision for 1-2 hrs       Medications: Outpatient Medications Prior to Visit  Medication Sig  . albuterol (PROVENTIL HFA;VENTOLIN HFA) 108 (90 Base) MCG/ACT inhaler Inhale 2 puffs into the lungs every 6 (six) hours as needed for wheezing or shortness of breath.  . empagliflozin (JARDIANCE) 25 MG TABS tablet Take 25 mg by mouth daily.  Marland Kitchen gabapentin (NEURONTIN) 300 MG capsule Take 1 capsule (300 mg total) by mouth 3 (three) times daily.  Marland Kitchen glucose blood test strip Test blood sugar once daily  . lisinopril (ZESTRIL) 10 MG tablet TAKE 1 TABLET EVERY DAY  . metFORMIN (GLUCOPHAGE) 1000 MG tablet TAKE 1 TABLET BY MOUTH TWICE A DAY WITH A MEAL  . mometasone-formoterol (DULERA) 100-5  MCG/ACT AERO Inhale 2 puffs into the lungs 2 (two) times daily at 10 AM and 5 PM.  . nitroGLYCERIN (NITROSTAT) 0.4 MG SL tablet Place 1 tablet (0.4 mg total) under the tongue every 5 (five) minutes as needed.  . pravastatin (PRAVACHOL) 20 MG tablet TAKE 1 TABLET EVERY DAY  . tiotropium (SPIRIVA HANDIHALER) 18 MCG inhalation capsule Place 1 capsule (18 mcg total) into inhaler and inhale daily.   No facility-administered medications prior to visit.    Review of Systems  Last CBC Lab Results  Component Value Date   WBC 9.4 06/29/2019   HGB 14.9 06/29/2019   HCT 43.7 06/29/2019   MCV 90 06/29/2019   MCH 30.8 06/29/2019   RDW 12.8 06/29/2019   PLT 353 24/23/5361   Last metabolic panel Lab Results  Component Value Date   GLUCOSE 162 (H) 06/29/2019   NA 134 06/29/2019   K 4.8 06/29/2019   CL 100 06/29/2019   CO2 17 (L) 06/29/2019   BUN 17 06/29/2019   CREATININE 0.96 06/29/2019   GFRNONAA 85 06/29/2019   GFRAA 98 06/29/2019   CALCIUM 9.6 06/29/2019   PROT 7.0 06/29/2019   ALBUMIN 4.7 06/29/2019   LABGLOB 2.3 06/29/2019   AGRATIO 2.0 06/29/2019   BILITOT 0.5 06/29/2019   ALKPHOS 89 06/29/2019   AST 15 06/29/2019   ALT 24 06/29/2019   ANIONGAP 10 04/29/2016      Objective    BP (!) 126/98 (BP Location: Right Arm, Patient Position: Sitting, Cuff Size: Normal)   Pulse 93   Temp 98.7 F (37.1 C) (Oral)   Wt 224 lb (101.6 kg)   SpO2 97%   BMI 29.55 kg/m  BP Readings from Last 3 Encounters:  12/15/19 (!) 126/98  06/22/19 (!) 152/78  05/18/19 110/77   Wt Readings from Last 3 Encounters:  12/15/19 224 lb (101.6 kg)  05/18/19 223 lb (101.2 kg)  04/17/19 221 lb 9.6 oz (100.5 kg)     Physical Exam Constitutional:      General: He is not in acute distress.    Appearance: He is well-developed.  HENT:     Head: Normocephalic and atraumatic.     Right Ear: Hearing normal.     Left Ear: Hearing normal.     Nose: Nose normal.  Eyes:     General: Lids are normal. No  scleral icterus.       Right eye: No discharge.        Left eye: No discharge.     Conjunctiva/sclera: Conjunctivae normal.  Cardiovascular:     Rate and Rhythm: Normal rate.     Heart sounds: Normal heart sounds.  Pulmonary:  Effort: Pulmonary effort is normal. No respiratory distress.     Breath sounds: Normal breath sounds.  Abdominal:     General: Bowel sounds are normal.     Palpations: Abdomen is soft.  Musculoskeletal:        General: Normal range of motion.     Comments: Severe pain in the left foot with the lightest touch or walking.   Skin:    Findings: Lesion present. No rash.     Comments: 2.5 cm rolled edged erythematous lesion with central scab on the right upper chest.  Neurological:     Mental Status: He is alert and oriented to person, place, and time.  Psychiatric:        Speech: Speech normal.        Behavior: Behavior normal.        Thought Content: Thought content normal.       No results found for any visits on 12/15/19.  Assessment & Plan     1. Type 2 diabetes mellitus with hyperosmolarity without coma, without long-term current use of insulin (HCC) FBS 141 this morning. No hypoglycemia on the Jardiance 25 mg qd and Metformin 1000 mg BID. Encouraged to schedule eye exam with ophthalmologist. - CBC with Differential/Platelet - Comprehensive metabolic panel - Lipid panel - Hemoglobin A1c  2. Complex regional pain syndrome type 1 of left lower extremity Essentially unchanged.Onset in 2008 following a fracture in the left foot. Continues to use crutches to walk and unable to wear shoes due to constant severe pain. Recognizes increase in pain level with barometric weather changes. Attempt to use narcotic analgesic in the past was unsuccessful and stopped due to fear of addiction.   3. Essential (primary) hypertension Continues to take the Lisinopril 10 mg qd without chest pains, palpitations, dyspnea or edema. Check CBC, CMP and TSH. States CRPS  worse today with rainy weather and diastolic pressure elevated. - CBC with Differential/Platelet - Comprehensive metabolic panel - TSH  4. Combined fat and carbohydrate induced hyperlipemia Tolerating Pravastatin 20 mg qd without side effects. Continue low fat diet and check follow up labs. - Comprehensive metabolic panel - Lipid panel - TSH  5. Neoplasm of uncertain behavior 2.5 diameter lesion on the right upper chest that is erythematous with central scab. Has been present for a year and has become friable with easy bleeding. No known trauma. Need dermatology referral for biopsy. Suspect squamous cell versus basal cell cancer. - Ambulatory referral to Dermatology   No follow-ups on file.      Andres Shad, PA, have reviewed all documentation for this visit. The documentation on 12/15/19 for the exam, diagnosis, procedures, and orders are all accurate and complete.    Vernie Murders, [place] [phone] (phone) [phone] (fax)  [place]

## 2019-12-16 LAB — HEMOGLOBIN A1C
Est. average glucose Bld gHb Est-mCnc: 214 mg/dL
Hgb A1c MFr Bld: 9.1 % — ABNORMAL HIGH (ref 4.8–5.6)

## 2019-12-16 LAB — CBC WITH DIFFERENTIAL/PLATELET
Basophils Absolute: 0.1 10*3/uL (ref 0.0–0.2)
Basos: 1 %
EOS (ABSOLUTE): 0.2 10*3/uL (ref 0.0–0.4)
Eos: 2 %
Hematocrit: 46.3 % (ref 37.5–51.0)
Hemoglobin: 15.8 g/dL (ref 13.0–17.7)
Immature Grans (Abs): 0 10*3/uL (ref 0.0–0.1)
Immature Granulocytes: 0 %
Lymphocytes Absolute: 2.2 10*3/uL (ref 0.7–3.1)
Lymphs: 24 %
MCH: 30.6 pg (ref 26.6–33.0)
MCHC: 34.1 g/dL (ref 31.5–35.7)
MCV: 90 fL (ref 79–97)
Monocytes Absolute: 0.5 10*3/uL (ref 0.1–0.9)
Monocytes: 6 %
Neutrophils Absolute: 6.1 10*3/uL (ref 1.4–7.0)
Neutrophils: 67 %
Platelets: 354 10*3/uL (ref 150–450)
RBC: 5.16 x10E6/uL (ref 4.14–5.80)
RDW: 12.2 % (ref 11.6–15.4)
WBC: 9.1 10*3/uL (ref 3.4–10.8)

## 2019-12-16 LAB — COMPREHENSIVE METABOLIC PANEL
ALT: 24 IU/L (ref 0–44)
AST: 16 IU/L (ref 0–40)
Albumin/Globulin Ratio: 2.4 — ABNORMAL HIGH (ref 1.2–2.2)
Albumin: 5.1 g/dL — ABNORMAL HIGH (ref 3.8–4.8)
Alkaline Phosphatase: 94 IU/L (ref 44–121)
BUN/Creatinine Ratio: 14 (ref 10–24)
BUN: 13 mg/dL (ref 8–27)
Bilirubin Total: 0.5 mg/dL (ref 0.0–1.2)
CO2: 19 mmol/L — ABNORMAL LOW (ref 20–29)
Calcium: 9.8 mg/dL (ref 8.6–10.2)
Chloride: 100 mmol/L (ref 96–106)
Creatinine, Ser: 0.95 mg/dL (ref 0.76–1.27)
GFR calc Af Amer: 99 mL/min/{1.73_m2} (ref >59)
GFR calc non Af Amer: 85 mL/min/{1.73_m2} (ref >59)
Globulin, Total: 2.1 g/dL (ref 1.5–4.5)
Glucose: 143 mg/dL — ABNORMAL HIGH (ref 65–99)
Potassium: 4.7 mmol/L (ref 3.5–5.2)
Sodium: 136 mmol/L (ref 134–144)
Total Protein: 7.2 g/dL (ref 6.0–8.5)

## 2019-12-16 LAB — LIPID PANEL
Chol/HDL Ratio: 3.7 ratio (ref 0.0–5.0)
Cholesterol, Total: 176 mg/dL (ref 100–199)
HDL: 48 mg/dL (ref >39)
LDL Chol Calc (NIH): 104 mg/dL — ABNORMAL HIGH (ref 0–99)
Triglycerides: 133 mg/dL (ref 0–149)
VLDL Cholesterol Cal: 24 mg/dL (ref 5–40)

## 2019-12-16 LAB — TSH: TSH: 0.927 u[IU]/mL (ref 0.450–4.500)

## 2019-12-17 ENCOUNTER — Telehealth: Payer: Self-pay

## 2019-12-17 NOTE — Progress Notes (Signed)
Patient is not checking blood sugar or  at home on a regular basis. He only checks his blood glucose when "he feels off".  Patient says his most recent A1C was greater than 9 but due to pain in his foot he admits to eating a lot of boxed pasta and rice the last few months. Reviewed dietary recommendations for diabetics with patient. Patient denies hypoglycemic/hyperglycemic episodes. .. Recent Relevant Labs: Lab Results  Component Value Date/Time   HGBA1C 9.1 (H) 12/15/2019 11:55 AM   HGBA1C 8.1 (H) 06/29/2019 08:55 AM   MICROALBUR negative 01/10/2018 03:02 PM   MICROALBUR 20 01/25/2017 01:55 PM    Kidney Function Lab Results  Component Value Date/Time   CREATININE 0.95 12/15/2019 11:55 AM   CREATININE 0.96 06/29/2019 08:55 AM   CREATININE 0.92 02/06/2017 09:40 AM   GFRNONAA 85 12/15/2019 11:55 AM   GFRNONAA 91 02/06/2017 09:40 AM   GFRAA 99 12/15/2019 11:55 AM   GFRAA 105 02/06/2017 09:40 AM    Patient can not remember what his blood pressure's have been at home. His most recent in office was high and he was told it needed to be rechecked. That has not been done. .. Reviewed chart prior to disease state call. Spoke with patient regarding BP  Recent Office Vitals: BP Readings from Last 3 Encounters:  12/15/19 (!) 126/98  06/22/19 (!) 152/78  05/18/19 110/77   Pulse Readings from Last 3 Encounters:  12/15/19 93  06/22/19 100  05/18/19 82    Wt Readings from Last 3 Encounters:  12/15/19 224 lb (101.6 kg)  05/18/19 223 lb (101.2 kg)  04/17/19 221 lb 9.6 oz (100.5 kg)     Kidney Function Lab Results  Component Value Date/Time   CREATININE 0.95 12/15/2019 11:55 AM   CREATININE 0.96 06/29/2019 08:55 AM   CREATININE 0.92 02/06/2017 09:40 AM   GFRNONAA 85 12/15/2019 11:55 AM   GFRNONAA 91 02/06/2017 09:40 AM   GFRAA 99 12/15/2019 11:55 AM   GFRAA 105 02/06/2017 09:40 AM    BMP Latest Ref Rng & Units 12/15/2019 06/29/2019 01/17/2018  Glucose 65 - 99 mg/dL 143(H) 162(H)  178(H)  BUN 8 - 27 mg/dL 13 17 14   Creatinine 0.76 - 1.27 mg/dL 0.95 0.96 1.09  BUN/Creat Ratio 10 - 24 14 18 13   Sodium 134 - 144 mmol/L 136 134 138  Potassium 3.5 - 5.2 mmol/L 4.7 4.8 4.8  Chloride 96 - 106 mmol/L 100 100 101  CO2 20 - 29 mmol/L 19(L) 17(L) 18(L)  Calcium 8.6 - 10.2 mg/dL 9.8 9.6 9.6     . Any recent hospitalizations or ED visits since last visit with CPP? No . What diet changes have been made to improve Blood Pressure Control?  o Patient has been making a lot of boxed meals. Reviewed hypertension diet with patient to reduce salt . What exercise is being done to improve your Blood Pressure Control?  o Patient's mobility is an issue due to pain    Patient is not taking his maintenance inhaler or Jardiance correctly due to cost. He is trying to make medications last as long as possible by not taking as directed. Patient assistant paperwork mailed to patient for appropriate medications.   [place], [place]

## 2019-12-25 ENCOUNTER — Other Ambulatory Visit: Payer: Self-pay | Admitting: Family Medicine

## 2019-12-25 DIAGNOSIS — E1100 Type 2 diabetes mellitus with hyperosmolarity without nonketotic hyperglycemic-hyperosmolar coma (NKHHC): Secondary | ICD-10-CM

## 2020-01-06 ENCOUNTER — Other Ambulatory Visit: Payer: Self-pay | Admitting: Family Medicine

## 2020-01-06 DIAGNOSIS — E1100 Type 2 diabetes mellitus with hyperosmolarity without nonketotic hyperglycemic-hyperosmolar coma (NKHHC): Secondary | ICD-10-CM

## 2020-01-06 MED ORDER — PRAVASTATIN SODIUM 20 MG PO TABS: 20.0000 mg | ORAL_TABLET | Freq: Every day | ORAL | 2 refills | Status: DC

## 2020-01-06 MED ORDER — METFORMIN HCL 1000 MG PO TABS: ORAL_TABLET | ORAL | 2 refills | Status: DC

## 2020-01-06 NOTE — Telephone Encounter (Signed)
Pt is requesting a refill for pravastatin (PRAVACHOL) 20 MG tablet    Pt says that his pharmacy didn't receive Rx for metFORMIN (GLUCOPHAGE) 1000 MG tablet     Pharmacy:  [facility], [place] Phone:  [phone]  Fax:  [phone]

## 2020-02-22 NOTE — Progress Notes (Addendum)
Subjective:   Patrick Grant is a 64 y.o. male who presents for Medicare Annual/Subsequent preventive examination.  I connected with Keontae Randon today by telephone and verified that I am speaking with the correct person using two identifiers. Location patient: home Location provider: work Persons participating in the virtual visit: patient, provider.   I discussed the limitations, risks, security and privacy concerns of performing an evaluation and management service by telephone and the availability of in person appointments. I also discussed with the patient that there may be a patient responsible charge related to this service. The patient expressed understanding and verbally consented to this telephonic visit.    Interactive audio and video telecommunications were attempted between this provider and patient, however failed, due to patient having technical difficulties OR patient did not have access to video capability.  We continued and completed visit with audio only.   Review of Systems    N/A  Cardiac Risk Factors include: advanced age (>3men, >86 women);diabetes mellitus;male gender;hypertension;dyslipidemia     Objective:    Today's Vitals   02/23/20 1055  PainSc: 7    There is no height or weight on file to calculate BMI.  Advanced Directives 02/23/2020 05/18/2019 04/17/2019 02/16/2019 02/13/2018 05/13/2017 04/17/2017  Does Patient Have a Medical Advance Directive? No No No No No Yes No  Type of Advance Directive - - - - - Press photographer;Living will -  Copy of [place] in Chart? - - - - - No - copy requested -  Would patient like information on creating a medical advance directive? No - Patient declined No - Patient declined - Yes (ED - Information included in AVS) Yes (MAU/Ambulatory/Procedural Areas - Information given) - Yes (MAU/Ambulatory/Procedural Areas - Information given)    Current Medications (verified) Outpatient  Encounter Medications as of 02/23/2020  Medication Sig   albuterol (PROVENTIL HFA;VENTOLIN HFA) 108 (90 Base) MCG/ACT inhaler Inhale 2 puffs into the lungs every 6 (six) hours as needed for wheezing or shortness of breath.   empagliflozin (JARDIANCE) 25 MG TABS tablet Take 25 mg by mouth daily.   glucose blood test strip Test blood sugar once daily   lisinopril (ZESTRIL) 10 MG tablet TAKE 1 TABLET EVERY DAY   metFORMIN (GLUCOPHAGE) 1000 MG tablet Take 1 tablet by mouth twice a day with a meal   mometasone-formoterol (DULERA) 100-5 MCG/ACT AERO Inhale 2 puffs into the lungs 2 (two) times daily at 10 AM and 5 PM.   pravastatin (PRAVACHOL) 20 MG tablet Take 1 tablet (20 mg total) by mouth daily.   tiotropium (SPIRIVA HANDIHALER) 18 MCG inhalation capsule Place 1 capsule (18 mcg total) into inhaler and inhale daily.   gabapentin (NEURONTIN) 300 MG capsule Take 1 capsule (300 mg total) by mouth 3 (three) times daily. (Patient not taking: Reported on 02/23/2020)   nitroGLYCERIN (NITROSTAT) 0.4 MG SL tablet Place 1 tablet (0.4 mg total) under the tongue every 5 (five) minutes as needed. (Patient not taking: Reported on 02/23/2020)   No facility-administered encounter medications on file as of 02/23/2020.    Allergies (verified) Almond (diagnostic), Fentanyl, and Clove flavor   History: Past Medical History:  Diagnosis Date   COPD (chronic obstructive pulmonary disease) (HCC)    CRPS (complex regional pain syndrome)    Diabetes mellitus without complication ([place])    Dizzy spells    occasional   Hyperlipidemia    Hypertension    Shingles 04/17/2019   Sleep apnea  Uses crutches    or wheelchair   Past Surgical History:  Procedure Laterality Date   COLONOSCOPY WITH PROPOFOL N/A 05/18/2019   Procedure: COLONOSCOPY WITH PROPOFOL;  Surgeon: Jonathon Bellows, MD;  Location: [facility];  Service: Endoscopy;  Laterality: N/A;  Diabetic - oral meds sleep apnea   POLYPECTOMY N/A 05/18/2019    Procedure: POLYPECTOMY;  Surgeon: Jonathon Bellows, MD;  Location: [facility];  Service: Endoscopy;  Laterality: N/A;   SPINAL CORD STIMULATOR REMOVAL  2010   DUE TO LOSS OF MUSCLE USE WHEN ACTIVATED   Family History  Problem Relation Age of Onset   CVA Mother    Kidney failure Mother    Diabetes Mother    CVA Father    Kidney failure Father    Colon cancer Father    Ovarian cancer Sister    Diabetes Brother    Healthy Sister    Social History   Socioeconomic History   Marital status: Single    Spouse name: Not on file   Number of children: 0   Years of education: Not on file   Highest education level: Some college, no degree  Occupational History   Occupation: disability  Tobacco Use   Smoking status: Former Smoker    Years: 25.00    Types: Cigars    Quit date: 03/12/2016    Years since quitting: 3.9   Smokeless tobacco: Never Used  Vaping Use   Vaping Use: Never used  Substance and Sexual Activity   Alcohol use: No    Alcohol/week: 0.0 standard drinks   Drug use: No   Sexual activity: Not on file  Other Topics Concern   Not on file  Social History Narrative   Not on file   Social Determinants of Health   Financial Resource Strain: Medium Risk   Difficulty of Paying Living Expenses: Somewhat hard  Food Insecurity: No Food Insecurity   Worried About Charity fundraiser in the Last Year: Never true   Ran Out of Food in the Last Year: Never true  Transportation Needs: No Transportation Needs   Lack of Transportation (Medical): No   Lack of Transportation (Non-Medical): No  Physical Activity: Inactive   Days of Exercise per Week: 0 days   Minutes of Exercise per Session: 0 min  Stress: Stress Concern Present   Feeling of Stress : To some extent  Social Connections: Moderately Isolated   Frequency of Communication with Friends and Family: Never   Frequency of Social Gatherings with Friends and Family: More than three times a week   Attends Religious  Services: Never   Marine scientist or Organizations: No   Attends Music therapist: Never   Marital Status: Living with partner    Tobacco Counseling Counseling given: Not Answered   Clinical Intake:  Pre-visit preparation completed: Yes  Pain : 0-10 Pain Score: 7  Pain Type: Chronic pain Pain Location: Leg Pain Orientation: Left Pain Descriptors / Indicators: Aching Pain Frequency: Constant Pain Relieving Factors: Not taking any medications for pain.  Pain Relieving Factors: Not taking any medications for pain.  Nutritional Risks: None Diabetes: Yes  How often do you need to have someone help you when you read instructions, pamphlets, or other written materials from your doctor or pharmacy?: 2 - Rarely What is the last grade level you completed in school?: Due to bad vision in left eye fron diabetes.  Diabetic? Yes  Nutrition Risk Assessment:  Has the patient had  any N/V/D within the last 2 months?  No  Does the patient have any non-healing wounds?  No  Has the patient had any unintentional weight loss or weight gain?  No   Diabetes:  Is the patient diabetic?  Yes  If diabetic, was a CBG obtained today?  No  Did the patient bring in their glucometer from home?  No  How often do you monitor your CBG's? Twice a day.   Financial Strains and Diabetes Management:  Are you having any financial strains with the device, your supplies or your medication?  Yes, however the [facility] pharmacist is assisting financial costs with Jardiance.  .  Does the patient want to be seen by Chronic Care Management for management of their diabetes?   No, currently speaking in contact with CCM team. Would the patient like to be referred to a Nutritionist or for Diabetic Management?  No   Diabetic Exams:  Diabetic Eye Exam: Overdue for diabetic eye exam. Pt has been advised about the importance in completing this exam. Patient advised to call and schedule an eye  exam. Diabetic Foot Exam: Completed 06/22/19   Interpreter Needed?: No  Information entered by :: [facility], LPN   Activities of Daily Living In your present state of health, do you have any difficulty performing the following activities: 02/23/2020 12/15/2019  Hearing? N N  Vision? Y Y  Comment Due to diabetic issues- declined scheduling an eye exam at this time. -  Difficulty concentrating or making decisions? N N  Walking or climbing stairs? Y Y  Comment Due to leg pain. -  Dressing or bathing? N N  Doing errands, shopping? Y Y  Comment Does not drive. -  Preparing Food and eating ? Y -  Comment Does not cook. -  Using the Toilet? N -  In the past six months, have you accidently leaked urine? N -  Do you have problems with loss of bowel control? N -  Managing your Medications? N -  Managing your Finances? N -  Housekeeping or managing your Housekeeping? Y -  Comment Does not clean. Friend manages all house work. -  Some recent data might be hidden    Patient Care Team: Chrismon, Vickki Muff, PA as PCP - General (Family Medicine) Allyne Gee, MD as Consulting Physician (Internal Medicine) Pa, [facility]) Jonathon Bellows, MD as Consulting Physician (Gastroenterology)  Indicate any recent Medical Services you may have received from other than [facility] providers in the past year (date may be approximate).     Assessment:   This is a routine wellness examination for [place].  Hearing/Vision screen No exam data present  Dietary issues and exercise activities discussed: Current Exercise Habits: The patient does not participate in regular exercise at present, Exercise limited by: orthopedic condition(s)  Goals      Chronic Care Management     CARE PLAN ENTRY  Current Barriers:  Chronic Disease Management support, education, and care coordination needs related to Hypertension, Hyperlipidemia, Diabetes, and COPD   Hypertension Pharmacist Clinical  Goal(s): Over the next 90 days, patient will work with PharmD and providers to achieve BP goal <140/90 Current regimen:  Lisinopril 10mg  daily Interventions: Increase lisinopril 20mg  daily (not ordered) Patient self care activities - Over the next 90 days, patient will: Check BP daily, document, and provide at future appointments Ensure daily salt intake < 2300 mg/day  Hyperlipidemia Pharmacist Clinical Goal(s): Over the next 90 days, patient will work with PharmD and  providers to achieve LDL goal < 100 Current regimen:  Pravastatin 20mg  at bedtime Interventions: Stop pravastatin Start rosuvastatin 20mg  1 tab by mouth daily (not ordered) Patient self care activities - Over the next 90 days, patient will: Take Crestor every day Show for any scheduled lab visits to track progress  Diabetes Pharmacist Clinical Goal(s): Over the next 90 days, patient will work with PharmD and providers to achieve A1c goal <7% Current regimen:  Metformin 1000 mg twice daily with meals Jardiance 25mg  daily (taken as needed) Interventions: Take Jardiance every day Patient self care activities - Over the next 90 days, patient will: Check blood sugar once daily, document, and provide at future appointments Contact provider with any episodes of hypoglycemia Meet deductible and/or provide financial documents for patient assistance program  COPD Pharmacist Clinical Goal(s) Over the next 90 days, patient will work with PharmD and providers to increase maintenance inhaler use Current regimen:  Albuterol rescue inhaler twice daily Dulera 1 puff twice daily (half dose) Spiriva occasionally Interventions: Patient assistance for [employer] inhaler which doesn't require out-of-pocket spending (financial documents not provided) Counseled on deductible Patient self care activities - Over the next 90 days, patient will: Meet deductible and/or provide proof of income for [facility]  Medication  management Pharmacist Clinical Goal(s): Over the next 90 days, patient will work with PharmD and providers to maintain optimal medication adherence Current pharmacy: [facility] order Interventions Comprehensive medication review performed. Continue current medication management strategy Patient self care activities - Over the next 90 days, patient will: Focus on medication adherence by meeting deductible Provided proof of income and medication spending Take medications as prescribed Report any questions or concerns to PharmD and/or provider(s) Reconnect with Dr. Natale Milch and continue care  Initial goal documentation      Prevent Falls     Recommend to remove any items from the home that may cause slips or trips.       Depression Screen PHQ 2/9 Scores 02/23/2020 12/15/2019 02/16/2019 02/13/2018 08/15/2017 06/12/2017 05/13/2017  PHQ - 2 Score 1 0 0 0 0 1 2  PHQ- 9 Score - 0 - - - - 7    Fall Risk Fall Risk  02/23/2020 12/15/2019 02/16/2019 02/13/2018 08/15/2017  Falls in the past year? 1 1 1 1  No  Comment - - - - -  Number falls in past yr: 1 1 1 1  -  Injury with Fall? 0 1 0 0 -  Comment - - - - -  Risk Factor Category  - - - - -  Risk for fall due to : Impaired mobility;Impaired balance/gait History of fall(s);Impaired balance/gait;Impaired mobility Impaired mobility;Impaired balance/gait Impaired balance/gait;Impaired vision -  Risk for fall due to: Comment - - - - -  Follow up Falls prevention discussed Falls evaluation completed;Falls prevention discussed Falls prevention discussed - -    Any stairs in or around the home? Yes  If so, are there any without handrails? No  Home free of loose throw rugs in walkways, pet beds, electrical cords, etc? Yes  Adequate lighting in your home to reduce risk of falls? Yes   ASSISTIVE DEVICES UTILIZED TO PREVENT FALLS:  Life alert? No  Use of a cane, walker or w/c? Yes  Grab bars in the bathroom? Yes  Shower chair or bench in shower?  No  Elevated toilet seat or a handicapped toilet? No    Cognitive Function: Declined today.      6CIT Screen 02/16/2019 01/31/2016  What Year? 0 points  0 points  What month? 0 points 3 points  What time? 0 points 0 points  Count back from 20 0 points 0 points  Months in reverse 4 points 4 points  Repeat phrase 2 points 6 points  Total Score 6 13    Immunizations Immunization History  Administered Date(s) Administered   Influenza,inj,Quad PF,6+ Mos 01/01/2014, 02/05/2015, 01/31/2016, 01/09/2017, 01/10/2018, 12/15/2019   PFIZER SARS-COV-2 Vaccination 06/22/2019, 07/21/2019   Pneumococcal Polysaccharide-23 01/09/2017    TDAP status: Due, Education has been provided regarding the importance of this vaccine. Advised may receive this vaccine at local pharmacy or Health Dept. Aware to provide a copy of the vaccination record if obtained from local pharmacy or Health Dept. Verbalized acceptance and understanding. Flu Vaccine status: Up to date Covid-19 vaccine status: Completed vaccines  Qualifies for Shingles Vaccine? Yes   Zostavax completed No   Shingrix Completed?: No.    Education has been provided regarding the importance of this vaccine. Patient has been advised to call insurance company to determine out of pocket expense if they have not yet received this vaccine. Advised may also receive vaccine at local pharmacy or Health Dept. Verbalized acceptance and understanding.  Screening Tests Health Maintenance  Topic Date Due   OPHTHALMOLOGY EXAM  Never done   TETANUS/TDAP  02/22/2021 (Originally 11/29/1974)   HEMOGLOBIN A1C  06/13/2020   FOOT EXAM  06/21/2020   COLONOSCOPY  05/17/2022   INFLUENZA VACCINE  Completed   PNEUMOCOCCAL POLYSACCHARIDE VACCINE AGE 76-64 HIGH RISK  Completed   COVID-19 Vaccine  Completed   Hepatitis C Screening  Completed   HIV Screening  Completed    Health Maintenance  Health Maintenance Due  Topic Date Due   OPHTHALMOLOGY EXAM  Never done     Colorectal cancer screening: Completed 05/18/19. Repeat every 3 years  Lung Cancer Screening: (Low Dose CT Chest recommended if Age 72-80 years, 30 pack-year currently smoking OR have quit w/in 15years.) does qualify however declines scan at this time.    Additional Screening:  Hepatitis C Screening: Up to date   Vision Screening: Recommended annual ophthalmology exams for early detection of glaucoma and other disorders of the eye. Is the patient up to date with their annual eye exam? No, pt declined scheduling a yearly eye exam at this time.  Who is the provider or what is the name of the office in which the patient attends annual eye exams? [facility] If pt is not established with a provider, would they like to be referred to a provider to establish care? No .   Dental Screening: Recommended annual dental exams for proper oral hygiene  Community Resource Referral / Chronic Care Management: CRR required this visit?  No   CCM required this visit?  No      Plan:     I have personally reviewed and noted the following in the patient's chart:   Medical and social history Use of alcohol, tobacco or illicit drugs  Current medications and supplements Functional ability and status Nutritional status Physical activity Advanced directives List of other physicians Hospitalizations, surgeries, and ER visits in previous 12 months Vitals Screenings to include cognitive, depression, and falls Referrals and appointments  In addition, I have reviewed and discussed with patient certain preventive protocols, quality metrics, and best practice recommendations. A written personalized care plan for preventive services as well as general preventive health recommendations were provided to patient.     Zayan Delvecchio [place], [place]   [id number]   Nurse  Notes: Pt is due for a yearly eye exam. Pt declines scheduling an apt with [facility] at this time.   Reviewed note and plan of Nurse Health  Advisor. Was available for consultation during screening. Agree with documentation and recommendations.

## 2020-02-23 ENCOUNTER — Other Ambulatory Visit: Payer: Self-pay

## 2020-02-23 ENCOUNTER — Ambulatory Visit (INDEPENDENT_AMBULATORY_CARE_PROVIDER_SITE_OTHER): Payer: Medicare Other

## 2020-02-23 DIAGNOSIS — Z0000 Encounter for general adult medical examination without abnormal findings: Secondary | ICD-10-CM | POA: Diagnosis not present

## 2020-02-23 NOTE — Patient Instructions (Signed)
Patrick Grant , Thank you for taking time to come for your Medicare Wellness Visit. I appreciate your ongoing commitment to your health goals. Please review the following plan we discussed and let me know if I can assist you in the future.   Screening recommendations/referrals: Colonoscopy: Up to date, due 04/2022 Recommended yearly ophthalmology/optometry visit for glaucoma screening and checkup Recommended yearly dental visit for hygiene and checkup  Vaccinations: Influenza vaccine: Done 12/15/19 Tdap vaccine: Currently due, declined at this time. Shingles vaccine: Shingrix discussed. Please contact your pharmacy for coverage information.     Advanced directives: Advance directive discussed with you today. Pt to print a copy from online and complete at home and then have forms notarized. Once this is complete please bring a copy in to our office so we can scan it into your chart.  Conditions/risks identified: Fall risk preventatives discussed today.   Next appointment: 02/29/20 @ 8:15 AM for a CCM call   Preventive Care 40-64 Years, Male Preventive care refers to lifestyle choices and visits with your health care provider that can promote health and wellness. What does preventive care include?  A yearly physical exam. This is also called an annual well check.  Dental exams once or twice a year.  Routine eye exams. Ask your health care provider how often you should have your eyes checked.  Personal lifestyle choices, including:  Daily care of your teeth and gums.  Regular physical activity.  Eating a healthy diet.  Avoiding tobacco and drug use.  Limiting alcohol use.  Practicing safe sex.  Taking low-dose aspirin every day starting at age 66. What happens during an annual well check? The services and screenings done by your health care provider during your annual well check will depend on your age, overall health, lifestyle risk factors, and family history of  disease. Counseling  Your health care provider may ask you questions about your:  Alcohol use.  Tobacco use.  Drug use.  Emotional well-being.  Home and relationship well-being.  Sexual activity.  Eating habits.  Work and work Statistician. Screening  You may have the following tests or measurements:  Height, weight, and BMI.  Blood pressure.  Lipid and cholesterol levels. These may be checked every 5 years, or more frequently if you are over 39 years old.  Skin check.  Lung cancer screening. You may have this screening every year starting at age 47 if you have a 30-pack-year history of smoking and currently smoke or have quit within the past 15 years.  Fecal occult blood test (FOBT) of the stool. You may have this test every year starting at age 74.  Flexible sigmoidoscopy or colonoscopy. You may have a sigmoidoscopy every 5 years or a colonoscopy every 10 years starting at age 25.  Prostate cancer screening. Recommendations will vary depending on your family history and other risks.  Hepatitis C blood test.  Hepatitis B blood test.  Sexually transmitted disease (STD) testing.  Diabetes screening. This is done by checking your blood sugar (glucose) after you have not eaten for a while (fasting). You may have this done every 1-3 years. Discuss your test results, treatment options, and if necessary, the need for more tests with your health care provider. Vaccines  Your health care provider may recommend certain vaccines, such as:  Influenza vaccine. This is recommended every year.  Tetanus, diphtheria, and acellular pertussis (Tdap, Td) vaccine. You may need a Td booster every 10 years.  Zoster vaccine. You may need this  after age 73.  Pneumococcal 13-valent conjugate (PCV13) vaccine. You may need this if you have certain conditions and have not been vaccinated.  Pneumococcal polysaccharide (PPSV23) vaccine. You may need one or two doses if you smoke cigarettes  or if you have certain conditions. Talk to your health care provider about which screenings and vaccines you need and how often you need them. This information is not intended to replace advice given to you by your health care provider. Make sure you discuss any questions you have with your health care provider. Document Released: 04/08/2015 Document Revised: 11/30/2015 Document Reviewed: 01/11/2015 Elsevier Interactive Patient Education  2017 [employer] Prevention in the Home Falls can cause injuries. They can happen to people of all ages. There are many things you can do to make your home safe and to help prevent falls. What can I do on the outside of my home?  Regularly fix the edges of walkways and driveways and fix any cracks.  Remove anything that might make you trip as you walk through a door, such as a raised step or threshold.  Trim any bushes or trees on the path to your home.  Use bright outdoor lighting.  Clear any walking paths of anything that might make someone trip, such as rocks or tools.  Regularly check to see if handrails are loose or broken. Make sure that both sides of any steps have handrails.  Any raised decks and porches should have guardrails on the edges.  Have any leaves, snow, or ice cleared regularly.  Use sand or salt on walking paths during winter.  Clean up any spills in your garage right away. This includes oil or grease spills. What can I do in the bathroom?  Use night lights.  Install grab bars by the toilet and in the tub and shower. Do not use towel bars as grab bars.  Use non-skid mats or decals in the tub or shower.  If you need to sit down in the shower, use a plastic, non-slip stool.  Keep the floor dry. Clean up any water that spills on the floor as soon as it happens.  Remove soap buildup in the tub or shower regularly.  Attach bath mats securely with double-sided non-slip rug tape.  Do not have throw rugs and other  things on the floor that can make you trip. What can I do in the bedroom?  Use night lights.  Make sure that you have a light by your bed that is easy to reach.  Do not use any sheets or blankets that are too big for your bed. They should not hang down onto the floor.  Have a firm chair that has side arms. You can use this for support while you get dressed.  Do not have throw rugs and other things on the floor that can make you trip. What can I do in the kitchen?  Clean up any spills right away.  Avoid walking on wet floors.  Keep items that you use a lot in easy-to-reach places.  If you need to reach something above you, use a strong step stool that has a grab bar.  Keep electrical cords out of the way.  Do not use floor polish or wax that makes floors slippery. If you must use wax, use non-skid floor wax.  Do not have throw rugs and other things on the floor that can make you trip. What can I do with my stairs?  Do not leave  any items on the stairs.  Make sure that there are handrails on both sides of the stairs and use them. Fix handrails that are broken or loose. Make sure that handrails are as long as the stairways.  Check any carpeting to make sure that it is firmly attached to the stairs. Fix any carpet that is loose or worn.  Avoid having throw rugs at the top or bottom of the stairs. If you do have throw rugs, attach them to the floor with carpet tape.  Make sure that you have a light switch at the top of the stairs and the bottom of the stairs. If you do not have them, ask someone to add them for you. What else can I do to help prevent falls?  Wear shoes that:  Do not have high heels.  Have rubber bottoms.  Are comfortable and fit you well.  Are closed at the toe. Do not wear sandals.  If you use a stepladder:  Make sure that it is fully opened. Do not climb a closed stepladder.  Make sure that both sides of the stepladder are locked into place.  Ask  someone to hold it for you, if possible.  Clearly mark and make sure that you can see:  Any grab bars or handrails.  First and last steps.  Where the edge of each step is.  Use tools that help you move around (mobility aids) if they are needed. These include:  Canes.  Walkers.  Scooters.  Crutches.  Turn on the lights when you go into a dark area. Replace any light bulbs as soon as they burn out.  Set up your furniture so you have a clear path. Avoid moving your furniture around.  If any of your floors are uneven, fix them.  If there are any pets around you, be aware of where they are.  Review your medicines with your doctor. Some medicines can make you feel dizzy. This can increase your chance of falling. Ask your doctor what other things that you can do to help prevent falls. This information is not intended to replace advice given to you by your health care provider. Make sure you discuss any questions you have with your health care provider. Document Released: 01/06/2009 Document Revised: 08/18/2015 Document Reviewed: 04/16/2014 Elsevier Interactive Patient Education  2017 [employer].

## 2020-02-26 DIAGNOSIS — Z23 Encounter for immunization: Secondary | ICD-10-CM | POA: Diagnosis not present

## 2020-02-29 ENCOUNTER — Telehealth: Payer: Self-pay

## 2020-02-29 NOTE — Chronic Care Management (AMB) (Deleted)
Chronic Care Management Pharmacy  Name: Patrick Grant  MRN: [id number] DOB: [birth year]  Chief Complaint/ HPI  Patrick Grant,  64 y.o. , male presents for their Follow-Up CCM visit with the clinical pharmacist via telephone.  PCP : Patrick Common, PA  Their chronic conditions include: Hypertension, Hyperlipidemia, Diabetes, and COPD  Office Visits: 02/23/20: Patient presented to [facility], LPN for AWV.   07/03/79: Patient presented to [facility], [place] for follow-up. A1c worsened to 9.1%.   Consult Visit:NA  Medications: Outpatient Encounter Medications as of 02/29/2020  Medication Sig Note  . albuterol (PROVENTIL HFA;VENTOLIN HFA) 108 (90 Base) MCG/ACT inhaler Inhale 2 puffs into the lungs every 6 (six) hours as needed for wheezing or shortness of breath.   . empagliflozin (JARDIANCE) 25 MG TABS tablet Take 25 mg by mouth daily.   Marland Kitchen gabapentin (NEURONTIN) 300 MG capsule Take 1 capsule (300 mg total) by mouth 3 (three) times daily. (Patient not taking: Reported on 02/23/2020)   . glucose blood test strip Test blood sugar once daily   . lisinopril (ZESTRIL) 10 MG tablet TAKE 1 TABLET EVERY DAY   . metFORMIN (GLUCOPHAGE) 1000 MG tablet Take 1 tablet by mouth twice a day with a meal   . mometasone-formoterol (DULERA) 100-5 MCG/ACT AERO Inhale 2 puffs into the lungs 2 (two) times daily at 10 AM and 5 PM.   . nitroGLYCERIN (NITROSTAT) 0.4 MG SL tablet Place 1 tablet (0.4 mg total) under the tongue every 5 (five) minutes as needed. (Patient not taking: Reported on 02/23/2020) 06/12/2017: Reports has not needed recently   . pravastatin (PRAVACHOL) 20 MG tablet Take 1 tablet (20 mg total) by mouth daily.   Marland Kitchen tiotropium (SPIRIVA HANDIHALER) 18 MCG inhalation capsule Place 1 capsule (18 mcg total) into inhaler and inhale daily.    No facility-administered encounter medications on file as of 02/29/2020.      Financial Resource Strain: Medium Risk  . Difficulty of Paying  Living Expenses: Somewhat hard    Current Diagnosis/Assessment:  Goals Addressed   None    Hypertension   BP goal is:  <140/90  Office blood pressures are  BP Readings from Last 3 Encounters:  12/15/19 (!) 126/98  06/22/19 (!) 152/78  05/18/19 110/77   Patient checks BP at home infrequently Patient home BP readings are ranging: NA  Patient has failed these meds in the past: NA Patient is currently uncontrolled on the following medications:  . Lisinopril 80m daily  We discussed  Lisinopril inc? No  Plan  Continue current medications   Hyperlipidemia   LDL goal < 100  Lipid Panel     Component Value Date/Time   CHOL 176 12/15/2019 1155   TRIG 133 12/15/2019 1155   HDL 48 12/15/2019 1155   LDLCALC 104 (H) 12/15/2019 1155   LDLCALC 86 02/06/2017 0940    Hepatic Function Latest Ref Rng & Units 12/15/2019 06/29/2019 01/17/2018  Total Protein 6.0 - 8.5 g/dL 7.2 7.0 6.5  Albumin 3.8 - 4.8 g/dL 5.1(H) 4.7 4.6  AST 0 - 40 IU/L 16 15 23   ALT 0 - 44 IU/L 24 24 26   Alk Phosphatase 44 - 121 IU/L 94 89 86  Total Bilirubin 0.0 - 1.2 mg/dL 0.5 0.5 0.5     The 10-year ASCVD risk score (Mikey BussingDC Jr., et al., 2013) is: 19.5%   Values used to calculate the score:     Age: [age]years     Sex: Male  Is Non-Hispanic African American: No     Diabetic: Yes     Tobacco smoker: No     Systolic Blood Pressure: 355 mmHg     Is BP treated: Yes     HDL Cholesterol: 48 mg/dL     Total Cholesterol: 176 mg/dL   Patient has failed these meds in past: NA Patient is currently uncontrolled on the following medications:  . Pravastatin 25m daily  We discussed:   Change to rosuvastatin? No Generally lost touch with PCP  Plan  Continue current medications    Diabetes   Recent Relevant Labs: Lab Results  Component Value Date/Time   HGBA1C 9.1 (H) 12/15/2019 11:55 AM   HGBA1C 8.1 (H) 06/29/2019 08:55 AM   MICROALBUR negative 01/10/2018 03:02 PM   MICROALBUR 20 01/25/2017  01:55 PM     Checking BG: 2x per Day  Recent FBG Readings: Recent pre-meal BG readings: 100 - 219 Patient has failed these meds in past: NA  Patient has failed these meds in past: Invokana, glipizide,  Patient is currently uncontrolled on the following medications:  .Marland KitchenMetformin 1000 mg twice daily  . Jardiance 25 mg daily   Last diabetic Foot exam: No results found for: HMDIABEYEEXA  Last diabetic Eye exam: No results found for: HMDIABFOOTEX   We discussed:  No fill Jardiance   Plan  Continue current medications  COPD / Asthma / Tobacco   Last spirometry score: ***  Gold Grade: {CHL HP Upstream Pharm COPD Gold GEZVGJ:[id number]}Current COPD Classification:  {CHL HP Upstream Pharm COPD [phone]}  Eosinophil count:   Lab Results  Component Value Date/Time   EOSPCT 2.4 02/06/2017 09:40 AM  %                               Eos (Absolute):  Lab Results  Component Value Date/Time   EOSABS 0.2 12/15/2019 11:55 AM    Tobacco Status:  Social History   Tobacco Use  Smoking Status Former Smoker  . Years: 25.00  . Types: Cigars  . Quit date: 03/12/2016  . Years since quitting: 3.9  Smokeless Tobacco Never Used    Patient has failed these meds in past: *** Patient is currently {CHL Controlled/[phone]} on the following medications:  . Albuterol HFA 2 puffs q6hr PRN  . Dulera 2 puffs twice daily  . Spiriva 18 mcg 1 cap daily   Using maintenance inhaler regularly? {yes/no:20286} Frequency of rescue inhaler use:  {CHL HP Upstream Pharm Inhaler FVTVN:[id number]} We discussed:  {CHL HP Upstream Pharmacy discussion:[phone]}  Plan  Continue {CHL HP Upstream Pharmacy [phone]}   Medication Management   [facility] [place] U[facility] pill [facility]? Yes Pt endorses 80% compliance  We discussed:   Plan   Continue current medication management strategy  Follow up: 3 month phone visit  AFort [phone]

## 2020-03-21 ENCOUNTER — Other Ambulatory Visit: Payer: Self-pay

## 2020-03-21 ENCOUNTER — Encounter: Payer: Self-pay | Admitting: Family Medicine

## 2020-03-21 ENCOUNTER — Ambulatory Visit (INDEPENDENT_AMBULATORY_CARE_PROVIDER_SITE_OTHER): Payer: Medicare Other | Admitting: Family Medicine

## 2020-03-21 VITALS — BP 158/93 | HR 94 | Temp 98.1°F | Ht 72.0 in | Wt 225.0 lb

## 2020-03-21 DIAGNOSIS — E1100 Type 2 diabetes mellitus with hyperosmolarity without nonketotic hyperglycemic-hyperosmolar coma (NKHHC): Secondary | ICD-10-CM | POA: Diagnosis not present

## 2020-03-21 DIAGNOSIS — G90522 Complex regional pain syndrome I of left lower limb: Secondary | ICD-10-CM | POA: Diagnosis not present

## 2020-03-21 DIAGNOSIS — I10 Essential (primary) hypertension: Secondary | ICD-10-CM | POA: Diagnosis not present

## 2020-03-21 NOTE — Progress Notes (Signed)
Established patient visit   Patient: Patrick Grant   DOB: 04-09-1955   64 y.o. Male  MRN: [id number] Visit Date: 03/21/2020  Today's healthcare provider: Dortha Kern, PA-C   No chief complaint on file.  Subjective    HPI  Diabetes Mellitus Type II, Follow-up  Lab Results  Component Value Date   HGBA1C 9.1 (H) 12/15/2019   HGBA1C 8.1 (H) 06/29/2019   HGBA1C 8.2 (A) 05/09/2018   Wt Readings from Last 3 Encounters:  12/15/19 224 lb (101.6 kg)  05/18/19 223 lb (101.2 kg)  04/17/19 221 lb 9.6 oz (100.5 kg)   Last seen for diabetes 3 months ago.   Management since then includes none. He reports excellent compliance with treatment. He is not having side effects.  Symptoms: Yes fatigue No foot ulcerations  No appetite changes No nausea  No paresthesia of the feet  No polydipsia  Yes polyuria No visual disturbances   No vomiting     Home blood sugar records: reports numbers around 156  Episodes of hypoglycemia? No    Current insulin regiment:  Most Recent Eye Exam: unknown Current exercise: none Current diet habits: in general, a "healthy" diet    Pertinent Labs: Lab Results  Component Value Date   CHOL 176 12/15/2019   HDL 48 12/15/2019   LDLCALC 104 (H) 12/15/2019   TRIG 133 12/15/2019   CHOLHDL 3.7 12/15/2019   Lab Results  Component Value Date   NA 136 12/15/2019   K 4.7 12/15/2019   CREATININE 0.95 12/15/2019   GFRNONAA 85 12/15/2019   GFRAA 99 12/15/2019   GLUCOSE 143 (H) 12/15/2019     --------------------------------------------------------------------------------------------------- .  Patient Active Problem List   Diagnosis Date Noted  . Dizziness 09/02/2017  . Pulmonary emphysema (HCC) 07/05/2017  . Scrotal pain, Right 04/19/2016  . Prostate cancer screening 04/19/2016  . Cervical pain 02/08/2015  . Cervical nerve root disorder 02/08/2015  . CRPS (complex regional pain syndrome), lower limb 02/08/2015  . Bleeding from the  nose 02/08/2015  . Reflex sympathetic dystrophy of lower extremity 02/08/2015  . Type 2 diabetes mellitus (HCC) 02/08/2015  . Combined fat and carbohydrate induced hyperlipemia 04/06/2008  . Elevated fasting blood sugar 10/10/2007  . Essential (primary) hypertension 09/30/2007   Past Medical History:  Diagnosis Date  . COPD (chronic obstructive pulmonary disease) (HCC)   . CRPS (complex regional pain syndrome)   . Diabetes mellitus without complication (HCC)   . Dizzy spells    occasional  . Hyperlipidemia   . Hypertension   . Shingles 04/17/2019  . Sleep apnea   . Uses crutches    or wheelchair   Family History  Problem Relation Age of Onset  . CVA Mother   . Kidney failure Mother   . Diabetes Mother   . CVA Father   . Kidney failure Father   . Colon cancer Father   . Ovarian cancer Sister   . Diabetes Brother   . Healthy Sister    Allergies  Allergen Reactions  . Almond (Diagnostic) Swelling    throat  . Fentanyl Other (See Comments)    Other reaction(s): Jittery  . Clove Flavor Other (See Comments)    Loss of vision for 1-2 hrs     Social History   Tobacco Use  . Smoking status: Former Smoker    Years: 25.00    Types: Cigars    Quit date: 03/12/2016    Years since quitting: 4.0  .  Smokeless tobacco: Never Used  Vaping Use  . Vaping Use: Never used  Substance Use Topics  . Alcohol use: No    Alcohol/week: 0.0 standard drinks  . Drug use: No   Family History  Problem Relation Age of Onset  . CVA Mother   . Kidney failure Mother   . Diabetes Mother   . CVA Father   . Kidney failure Father   . Colon cancer Father   . Ovarian cancer Sister   . Diabetes Brother   . Healthy Sister      Medications: Outpatient Medications Prior to Visit  Medication Sig  . albuterol (PROVENTIL HFA;VENTOLIN HFA) 108 (90 Base) MCG/ACT inhaler Inhale 2 puffs into the lungs every 6 (six) hours as needed for wheezing or shortness of breath.  . empagliflozin (JARDIANCE)  25 MG TABS tablet Take 25 mg by mouth daily.  Marland Kitchen gabapentin (NEURONTIN) 300 MG capsule Take 1 capsule (300 mg total) by mouth 3 (three) times daily. (Patient not taking: Reported on 02/23/2020)  . glucose blood test strip Test blood sugar once daily  . lisinopril (ZESTRIL) 10 MG tablet TAKE 1 TABLET EVERY DAY  . metFORMIN (GLUCOPHAGE) 1000 MG tablet Take 1 tablet by mouth twice a day with a meal  . mometasone-formoterol (DULERA) 100-5 MCG/ACT AERO Inhale 2 puffs into the lungs 2 (two) times daily at 10 AM and 5 PM.  . nitroGLYCERIN (NITROSTAT) 0.4 MG SL tablet Place 1 tablet (0.4 mg total) under the tongue every 5 (five) minutes as needed. (Patient not taking: Reported on 02/23/2020)  . pravastatin (PRAVACHOL) 20 MG tablet Take 1 tablet (20 mg total) by mouth daily.  Marland Kitchen tiotropium (SPIRIVA HANDIHALER) 18 MCG inhalation capsule Place 1 capsule (18 mcg total) into inhaler and inhale daily.   No facility-administered medications prior to visit.    Review of Systems  Constitutional: Negative.   Respiratory: Negative.   Cardiovascular: Negative.   Gastrointestinal: Negative.   Endocrine: Negative.   Genitourinary: Negative.   Musculoskeletal:       CRPS in the left foot flared today.    {Heme  Chem  Endocrine  Serology  Results Review (optional):23779::" "}  Objective    BP (!) 158/93 (BP Location: Right Arm, Patient Position: Sitting, Cuff Size: Large)   Pulse 94   Temp 98.1 F (36.7 C) (Oral)   Ht 6' (1.829 m)   Wt 225 lb (102.1 kg)   SpO2 98%   BMI 30.52 kg/m  BP Readings from Last 3 Encounters:  03/21/20 (!) 158/93  12/15/19 (!) 126/98  06/22/19 (!) 152/78   Wt Readings from Last 3 Encounters:  03/21/20 225 lb (102.1 kg)  12/15/19 224 lb (101.6 kg)  05/18/19 223 lb (101.2 kg)     Physical Exam Constitutional:      General: He is not in acute distress.    Appearance: He is well-developed and well-nourished.  HENT:     Head: Normocephalic and atraumatic.     Right  Ear: Hearing normal.     Left Ear: Hearing normal.     Nose: Nose normal.  Eyes:     General: Lids are normal. No scleral icterus.       Right eye: No discharge.        Left eye: No discharge.     Conjunctiva/sclera: Conjunctivae normal.  Cardiovascular:     Rate and Rhythm: Normal rate and regular rhythm.     Heart sounds: Normal heart sounds.  Pulmonary:  Effort: Pulmonary effort is normal. No respiratory distress.     Breath sounds: Normal breath sounds.  Abdominal:     General: Bowel sounds are normal.     Palpations: Abdomen is soft.  Musculoskeletal:        General: Tenderness present. Normal range of motion.     Cervical back: Neck supple.     Comments: CRPS in left foot for years.  Skin:    General: Skin is intact.     Findings: No lesion or rash.  Neurological:     Mental Status: He is alert and oriented to person, place, and time.  Psychiatric:        Mood and Affect: Mood and affect normal.        Speech: Speech normal.        Behavior: Behavior normal.        Thought Content: Thought content normal.      No results found for any visits on 03/21/20.  Assessment & Plan     1. Type 2 diabetes mellitus with hyperosmolarity without coma, without long-term current use of insulin (HCC) Last Hgb A1C was 9.1 in September 2021. FBS has been spiking to 150 during the holidays. Still taking Jardiance 25 mg qd with Metformin 1000 mg BID, Lisinopril 10 mg qd and Pravastatin 20 mg qd. Recheck follow up labs. - CBC with Differential/Platelet - Comprehensive metabolic panel - Hemoglobin A1c  2. Essential (primary) hypertension Tolerating Lisinopril 10 mg qd and may be elevated today with flare of CRPS. Restrict caffeine, salt intake and get off the holiday eating. Recheck labs. - CBC with Differential/Platelet - Comprehensive metabolic panel - TSH  3. Complex regional pain syndrome type 1 of left lower extremity A little flare of pain today with weather changes.     No follow-ups on file.      I, Jarvis Knodel, PA-C, have reviewed all documentation for this visit. The documentation on 03/21/20 for the exam, diagnosis, procedures, and orders are all accurate and complete.    Vernie Murders, PA-C  [employer] [phone] (phone) [phone] (fax)  [place]

## 2020-03-22 LAB — CBC WITH DIFFERENTIAL/PLATELET
Basophils Absolute: 0.1 10*3/uL (ref 0.0–0.2)
Basos: 1 %
EOS (ABSOLUTE): 0.2 10*3/uL (ref 0.0–0.4)
Eos: 2 %
Hematocrit: 48.5 % (ref 37.5–51.0)
Hemoglobin: 16.4 g/dL (ref 13.0–17.7)
Immature Grans (Abs): 0 10*3/uL (ref 0.0–0.1)
Immature Granulocytes: 0 %
Lymphocytes Absolute: 2.5 10*3/uL (ref 0.7–3.1)
Lymphs: 23 %
MCH: 30.4 pg (ref 26.6–33.0)
MCHC: 33.8 g/dL (ref 31.5–35.7)
MCV: 90 fL (ref 79–97)
Monocytes Absolute: 0.6 10*3/uL (ref 0.1–0.9)
Monocytes: 6 %
Neutrophils Absolute: 7.6 10*3/uL — ABNORMAL HIGH (ref 1.4–7.0)
Neutrophils: 68 %
Platelets: 377 10*3/uL (ref 150–450)
RBC: 5.4 x10E6/uL (ref 4.14–5.80)
RDW: 12.5 % (ref 11.6–15.4)
WBC: 11 10*3/uL — ABNORMAL HIGH (ref 3.4–10.8)

## 2020-03-22 LAB — COMPREHENSIVE METABOLIC PANEL
ALT: 24 IU/L (ref 0–44)
AST: 20 IU/L (ref 0–40)
Albumin/Globulin Ratio: 2 (ref 1.2–2.2)
Albumin: 5.1 g/dL — ABNORMAL HIGH (ref 3.8–4.8)
Alkaline Phosphatase: 96 IU/L (ref 44–121)
BUN/Creatinine Ratio: 15 (ref 10–24)
BUN: 17 mg/dL (ref 8–27)
Bilirubin Total: 0.5 mg/dL (ref 0.0–1.2)
CO2: 16 mmol/L — ABNORMAL LOW (ref 20–29)
Calcium: 10.1 mg/dL (ref 8.6–10.2)
Chloride: 97 mmol/L (ref 96–106)
Creatinine, Ser: 1.16 mg/dL (ref 0.76–1.27)
GFR calc Af Amer: 78 mL/min/{1.73_m2} (ref >59)
GFR calc non Af Amer: 67 mL/min/{1.73_m2} (ref >59)
Globulin, Total: 2.6 g/dL (ref 1.5–4.5)
Glucose: 160 mg/dL — ABNORMAL HIGH (ref 65–99)
Potassium: 4.7 mmol/L (ref 3.5–5.2)
Sodium: 132 mmol/L — ABNORMAL LOW (ref 134–144)
Total Protein: 7.7 g/dL (ref 6.0–8.5)

## 2020-03-22 LAB — HEMOGLOBIN A1C
Est. average glucose Bld gHb Est-mCnc: 194 mg/dL
Hgb A1c MFr Bld: 8.4 % — ABNORMAL HIGH (ref 4.8–5.6)

## 2020-03-22 LAB — TSH: TSH: 1.42 u[IU]/mL (ref 0.450–4.500)

## 2020-04-27 ENCOUNTER — Ambulatory Visit: Payer: Medicare Other | Admitting: Dermatology

## 2020-05-02 ENCOUNTER — Telehealth: Payer: Self-pay | Admitting: Family Medicine

## 2020-05-02 NOTE — Telephone Encounter (Signed)
Milissa with pt's pharmacy called in to follow up on PA for mometasone-formoterol (DULERA) 100-5 MCG/ACT AERO. Milissa with pharmacy states they've faxed over documentation for this medication. Pt's insurance is requesting more information in order to cover Rx's Rx.   Please assist.   Pharmacy: [phone]

## 2020-05-03 ENCOUNTER — Telehealth: Payer: Self-pay

## 2020-05-03 ENCOUNTER — Other Ambulatory Visit: Payer: Self-pay

## 2020-05-03 MED ORDER — MOMETASONE FURO-FORMOTEROL FUM 100-5 MCG/ACT IN AERO: 2.0000 | INHALATION_SPRAY | Freq: Two times a day (BID) | RESPIRATORY_TRACT | 3 refills | Status: DC

## 2020-05-03 MED ORDER — ALBUTEROL SULFATE HFA 108 (90 BASE) MCG/ACT IN AERS: 2.0000 | INHALATION_SPRAY | Freq: Four times a day (QID) | RESPIRATORY_TRACT | 3 refills | Status: DC | PRN

## 2020-05-03 NOTE — Telephone Encounter (Signed)
Patient states that you have been working on getting him medication.  I did not see any notes and was not sure what was going on.  If possible could you please update patient on status of medication assistance? Thank you

## 2020-05-03 NOTE — Telephone Encounter (Signed)
This message was incorrect.  Pharmacy is requesting information of Glucose test strips.  Pharmacy will fax forms to the office.

## 2020-05-03 NOTE — Telephone Encounter (Signed)
LMTCB-need to speak with patient so I can get more information about his [employer] Please put call through to office

## 2020-05-16 ENCOUNTER — Ambulatory Visit (INDEPENDENT_AMBULATORY_CARE_PROVIDER_SITE_OTHER): Payer: Medicare Other | Admitting: Dermatology

## 2020-05-16 ENCOUNTER — Other Ambulatory Visit: Payer: Self-pay

## 2020-05-16 DIAGNOSIS — L578 Other skin changes due to chronic exposure to nonionizing radiation: Secondary | ICD-10-CM | POA: Diagnosis not present

## 2020-05-16 DIAGNOSIS — D492 Neoplasm of unspecified behavior of bone, soft tissue, and skin: Secondary | ICD-10-CM

## 2020-05-16 DIAGNOSIS — C44519 Basal cell carcinoma of skin of other part of trunk: Secondary | ICD-10-CM | POA: Diagnosis not present

## 2020-05-16 DIAGNOSIS — C4491 Basal cell carcinoma of skin, unspecified: Secondary | ICD-10-CM

## 2020-05-16 HISTORY — DX: Basal cell carcinoma of skin, unspecified: C44.91

## 2020-05-16 NOTE — Patient Instructions (Addendum)
Wound Care Instructions  On the day following your surgery, you should begin doing daily dressing changes: 1. Remove the old dressing and discard it. 2. Cleanse the wound gently with tap water. This may be done in the shower or by placing a wet gauze pad directly on the wound and letting it soak for several minutes. 3. It is important to gently remove any dried blood from the wound in order to encourage healing. This may be done by gently rolling a moistened Q-tip on the dried blood. Do not pick at the wound. 4. If the wound should start to bleed, continue cleaning the wound, then place a moist gauze pad on the wound and hold pressure for a few minutes.  5. Make sure you then dry the skin surrounding the wound completely or the tape will not stick to the skin. Do not use cotton balls on the wound. 6. After the wound is clean and dry, apply the ointment gently with a Q-tip. 7. Cut a non-stick pad to fit the size of the wound. Lay the pad flush to the wound. If the wound is draining, you may want to reinforce it with a small amount of gauze on top of the non-stick pad for a little added compression to the area. 8. Use the tape to seal the area completely. 9. Select from the following with respect to your individual situation: 1. If your wound has been stitched closed: continue the above steps 1-8 at least daily until your sutures are removed. 2. If your wound has been left open to heal: continue steps 1-8 at least daily for the first 3-4 weeks. 10. We would like for you to take a few extra precautions for at least the next week. 1. Sleep with your head elevated on pillows if our wound is on your head. 2. Do not bend over or lift heavy items to reduce the chance of elevated blood pressure to the wound 3. Do not participate in particularly strenuous activities.   Below is a list of dressing supplies you might need.  . Cotton-tipped applicators - Q-tips . Gauze pads (2x2 and/or 4x4) - All-Purpose  Sponges . Non-stick dressing material - Telfa . Tape - Paper or Hypafix . New and clean tube of petroleum jelly - Vaseline    Comments on Post-Operative Period 1. Slight swelling and redness often appear around the wound. This is normal and will disappear within several days following the surgery. 2. The healing wound will drain a brownish-red-yellow discharge during healing. This is a normal phase of wound healing. As the wound begins to heal, the drainage may increase in amount. Again, this drainage is normal. 3. Notify us if the drainage becomes persistently bloody, excessively swollen, or intensely painful or develops a foul odor or red streaks.  4. If you should experience mild discomfort during the healing phase, you may take an aspirin-free medication such as Tylenol (acetaminophen). Notify us if the discomfort is severe or persistent. Avoid alcoholic beverages when taking pain medicine.  In Case of Wound Hemorrhage A wound hemorrhage is when the bandage suddenly becomes soaked with bright red blood and flows profusely. If this happens, sit down or lie down with your head elevated. If the wound has a dressing on it, do not remove the dressing. Apply pressure to the existing gauze. If the wound is not covered, use a gauze pad to apply pressure and continue applying the pressure for 20 minutes without peeking. DO NOT COVER THE WOUND WITH   A LARGE TOWEL OR [place] CLOTH. Release your hand from the wound site but do not remove the dressing. If the bleeding has stopped, gently clean around the wound. Leave the dressing in place for 24 hours if possible. This wait time allows the blood vessels to close off so that you do not spark a new round of bleeding by disrupting the newly clotted blood vessels with an immediate dressing change. If the bleeding does not subside, continue to hold pressure. If matters are out of your control, contact an After Hours clinic or go to the Emergency  Room.     Pre-Operative Instructions  You are scheduled for a surgical procedure at [facility]. We recommend you read the following instructions. If you have any questions or concerns, please call the office at [phone].  1. Shower and wash the entire body with soap and water the day of your surgery paying special attention to cleansing at and around the planned surgery site.  2. Avoid aspirin or aspirin containing products at least fourteen (14) days prior to your surgical procedure and for at least one week (7 Days) after your surgical procedure. If you take aspirin on a regular basis for heart disease or history of stroke or for any other reason, we may recommend you continue taking aspirin but please notify us if you take this on a regular basis. Aspirin can cause more bleeding to occur during surgery as well as prolonged bleeding and bruising after surgery.   3. Avoid other nonsteroidal pain medications at least one week prior to surgery and at least one week prior to your surgery. These include medications such as Ibuprofen (Motrin, Advil and Nuprin), Naprosyn, Voltaren, Relafen, etc. If medications are used for therapeutic reasons, please inform us as they can cause increased bleeding or prolonged bleeding during and bruising after surgical procedures.   4. Please advise [place] if you are taking any "blood thinner" medications such as Coumadin or Dipyridamole or Plavix or similar medications. These cause increased bleeding and prolonged bleeding during procedures and bruising after surgical procedures. We may have to consider discontinuing these medications briefly prior to and shortly after your surgery if safe to do so.   5. Please inform us of all medications you are currently taking. All medications that are taken regularly should be taken the day of surgery as you always do. Nevertheless, we need to be informed of what medications you are taking prior to surgery to know whether  they will affect the procedure or cause any complications.   6. Please inform us of any medication allergies. Also inform us of whether you have allergies to Latex or rubber products or whether you have had any adverse reaction to Lidocaine or Epinephrine.  7. Please inform us of any prosthetic or artificial body parts such as artificial heart valve, joint replacements, etc., or similar condition that might require preoperative antibiotics.   8. We recommend avoidance of alcohol at least two weeks prior to surgery and continued avoidance for at least two weeks after surgery.   9. We recommend discontinuation of tobacco smoking at least two weeks prior to surgery and continued abstinence for at least two weeks after surgery.  10. Do not plan strenuous exercise, strenuous work or strenuous lifting for approximately four weeks after your surgery.   11. We request if you are unable to make your scheduled surgical appointment, please call us at least a week in advance or as soon as you are aware of  a problem so that we can cancel or reschedule the appointment.   12. You MAY TAKE TYLENOL (acetaminophen) for pain as it is not a blood thinner.   13. PLEASE PLAN TO BE IN TOWN FOR TWO WEEKS FOLLOWING SURGERY, THIS IS IMPORTANT SO YOU CAN BE CHECKED FOR DRESSING CHANGES, SUTURE REMOVAL AND TO MONITOR FOR POSSIBLE COMPLICATIONS.  14.

## 2020-05-16 NOTE — Progress Notes (Signed)
   New Patient Visit  Subjective  Patrick Grant is a 65 y.o. male who presents for the following: Skin Problem (Check a growth on the R chest x 2 years growing and bleeding ).  Patient says he has history of significant sun exposure in the past. Friend with pt  The following portions of the chart were reviewed this encounter and updated as appropriate:   Tobacco  Allergies  Meds  Problems  Med Hx  Surg Hx  Fam Hx     Review of Systems:  No other skin or systemic complaints except as noted in HPI or Assessment and Plan.  Objective  Well appearing patient in no apparent distress; mood and affect are within normal limits.  A focused examination was performed including chest . Relevant physical exam findings are noted in the Assessment and Plan.  Objective  Right chest: 2.8 cm pearly pink plaque         Assessment & Plan  Neoplasm of skin Right chest  Skin / nail biopsy Type of biopsy: tangential   Informed consent: discussed and consent obtained   Patient was prepped and draped in usual sterile fashion: area prepped with alcohol. Anesthesia: the lesion was anesthetized in a standard fashion   Anesthetic:  1% lidocaine w/ epinephrine 1-100,000 buffered w/ 8.4% NaHCO3 Hemostasis achieved with: suture, pressure, aluminum chloride and electrodesiccation   Outcome: patient tolerated procedure well   Post-procedure details: wound care instructions given   Post-procedure details comment:  Ointment and small bandage  Specimen 1 - Surgical pathology Differential Diagnosis: R/O BCC  Check Margins: No 2.8 cm pearly pink plaque  Pending pathology results plan on surgery removal   Actinic Damage - chronic, secondary to cumulative UV radiation exposure/sun exposure over time - diffuse scaly erythematous macules with underlying dyspigmentation - Recommend daily broad spectrum sunscreen SPF 30+ to sun-exposed areas, reapply every 2 hours as needed.  - Call for new or  changing lesions.  Return pending biopsy results .  IMarye Round, CMA, am acting as scribe for Sarina Ser, MD .  Documentation: I have reviewed the above documentation for accuracy and completeness, and I agree with the above.  Sarina Ser, MD

## 2020-05-17 ENCOUNTER — Encounter: Payer: Self-pay | Admitting: Dermatology

## 2020-05-19 ENCOUNTER — Telehealth: Payer: Self-pay

## 2020-05-19 NOTE — Telephone Encounter (Signed)
-----   Message from Ralene Bathe, MD sent at 05/17/2020  6:42 PM EST ----- Diagnosis Skin , right chest BASAL CELL CARCINOMA, NODULAR PATTERN  Cancer - BCC As suspected Schedule for surgery

## 2020-05-19 NOTE — Telephone Encounter (Signed)
Advised patient of results and scheduled surgery appointment/hd

## 2020-05-28 ENCOUNTER — Other Ambulatory Visit: Payer: Self-pay | Admitting: Family Medicine

## 2020-06-15 ENCOUNTER — Telehealth: Payer: Self-pay

## 2020-06-15 NOTE — Progress Notes (Signed)
    Chronic Care Management Pharmacy Assistant   Name: Patrick Grant  MRN: [id number] DOB: 1955-05-28   Reason for Encounter: Medication Review /General Adherence Call.    Recent office visits:  12/15/2019 PCP [name] Chrismon  03/21/2020 PCP Vernie Murders  Recent consult visits:  05/16/2020 Dermatology [facility] visits:  None in previous 6 months  Medications: Outpatient Encounter Medications as of 06/15/2020  Medication Sig Note  . albuterol (VENTOLIN HFA) 108 (90 Base) MCG/ACT inhaler Inhale 2 puffs into the lungs every 6 (six) hours as needed for wheezing or shortness of breath.   . empagliflozin (JARDIANCE) 25 MG TABS tablet Take 25 mg by mouth daily.   Marland Kitchen gabapentin (NEURONTIN) 300 MG capsule Take 1 capsule (300 mg total) by mouth 3 (three) times daily. (Patient not taking: No sig reported)   . glucose blood test strip Test blood sugar once daily   . lisinopril (ZESTRIL) 10 MG tablet TAKE 1 TABLET EVERY DAY   . metFORMIN (GLUCOPHAGE) 1000 MG tablet Take 1 tablet by mouth twice a day with a meal   . mometasone-formoterol (DULERA) 100-5 MCG/ACT AERO Inhale 2 puffs into the lungs 2 (two) times daily at 10 AM and 5 PM.   . nitroGLYCERIN (NITROSTAT) 0.4 MG SL tablet Place 1 tablet (0.4 mg total) under the tongue every 5 (five) minutes as needed. 06/12/2017: Reports has not needed recently   . pravastatin (PRAVACHOL) 20 MG tablet Take 1 tablet (20 mg total) by mouth daily.   Marland Kitchen tiotropium (SPIRIVA HANDIHALER) 18 MCG inhalation capsule Place 1 capsule (18 mcg total) into inhaler and inhale daily.    No facility-administered encounter medications on file as of 06/15/2020.      Star Rating Drugs:lisinopril,metformin,pravastatin  Called patient and discussed medication adherence  with patient, no issues at this time with current medication.   Patient denies ED visit since his last CPP follow up.  Patient denies any side effects with his medication. Patient  denies any problems with his current pharmacy  Patient states he is doing "good".  [facility] Pharmacist Assistant [phone]

## 2020-06-21 ENCOUNTER — Telehealth: Payer: Self-pay

## 2020-06-21 ENCOUNTER — Encounter: Payer: Self-pay | Admitting: Dermatology

## 2020-06-21 ENCOUNTER — Other Ambulatory Visit: Payer: Self-pay

## 2020-06-21 ENCOUNTER — Ambulatory Visit (INDEPENDENT_AMBULATORY_CARE_PROVIDER_SITE_OTHER): Payer: Medicare Other | Admitting: Dermatology

## 2020-06-21 DIAGNOSIS — C44519 Basal cell carcinoma of skin of other part of trunk: Secondary | ICD-10-CM

## 2020-06-21 MED ORDER — MUPIROCIN 2 % EX OINT: 1.0000 "application " | TOPICAL_OINTMENT | Freq: Every day | CUTANEOUS | 1 refills | Status: DC

## 2020-06-21 MED ORDER — DOXYCYCLINE MONOHYDRATE 100 MG PO CAPS: 100.0000 mg | ORAL_CAPSULE | Freq: Two times a day (BID) | ORAL | 0 refills | Status: AC

## 2020-06-21 NOTE — Patient Instructions (Signed)
If you have any questions or concerns for your doctor, please call our main line at [phone] and press option 4 to reach your doctor's medical assistant. If no one answers, please leave a voicemail as directed and we will return your call as soon as possible. Messages left after 4 pm will be answered the following business day.   You may also send us a message via MyChart. We typically respond to MyChart messages within 1-2 business days.  For prescription refills, please ask your pharmacy to contact our office. Our fax number is [phone].  If you have an urgent issue when the clinic is closed that cannot wait until the next business day, you can page your doctor at the number below.    Please note that while we do our best to be available for urgent issues outside of office hours, we are not available 24/7.   If you have an urgent issue and are unable to reach us, you may choose to seek medical care at your doctor's office, retail clinic, urgent care center, or emergency room.  If you have a medical emergency, please immediately call 911 or go to the emergency department.  Pager Numbers  - Dr. Kowalski: [phone]  - Dr. Moye: [phone]  - Dr. Stewart: [phone]  In the event of inclement weather, please call our main line at [phone] for an update on the status of any delays or closures.  Dermatology Medication Tips: Please keep the boxes that topical medications come in in order to help keep track of the instructions about where and how to use these. Pharmacies typically print the medication instructions only on the boxes and not directly on the medication tubes.   If your medication is too expensive, please contact our office at [phone] option 4 or send us a message through MyChart.   We are unable to tell what your co-pay for medications will be in advance as this is different depending on your insurance coverage. However, we may be able to find a  substitute medication at lower cost or fill out paperwork to get insurance to cover a needed medication.   If a prior authorization is required to get your medication covered by your insurance company, please allow us 1-2 business days to complete this process.  Drug prices often vary depending on where the prescription is filled and some pharmacies may offer cheaper prices.  The website www.goodrx.com contains coupons for medications through different pharmacies. The prices here do not account for what the cost may be with help from insurance (it may be cheaper with your insurance), but the website can give you the price if you did not use any insurance.  - You can print the associated coupon and take it with your prescription to the pharmacy.  - You may also stop by our office during regular business hours and pick up a GoodRx coupon card.  - If you need your prescription sent electronically to a different pharmacy, notify our office through [place] MyChart or by phone at [phone] option 4.     Wound Care Instructions  1. Cleanse wound gently with soap and water once a day then pat dry with clean gauze. Apply a thing coat of Petrolatum (petroleum jelly, "Vaseline") over the wound (unless you have an allergy to this). We recommend that you use a new, sterile tube of Vaseline. Do not pick or remove scabs. Do not remove the yellow or white "healing tissue" from the base of the wound.    2. Cover the wound with fresh, clean, nonstick gauze and secure with paper tape. You may use Band-Aids in place of gauze and tape if the would is small enough, but would recommend trimming much of the tape off as there is often too much. Sometimes Band-Aids can irritate the skin.  3. You should call the office for your biopsy report after 1 week if you have not already been contacted.  4. If you experience any problems, such as abnormal amounts of bleeding, swelling, significant bruising, significant pain,  or evidence of infection, please call the office immediately.  5. FOR ADULT SURGERY PATIENTS: If you need something for pain relief you may take 1 extra strength Tylenol (acetaminophen) AND 2 Ibuprofen (200mg each) together every 4 hours as needed for pain. (do not take these if you are allergic to them or if you have a reason you should not take them.) Typically, you may only need pain medication for 1 to 3 days.     

## 2020-06-21 NOTE — Progress Notes (Signed)
   Follow-Up Visit   Subjective  Patrick Grant is a 65 y.o. male who presents for the following: BCC bx proven (R chest, pt presents for excision).  Patient accompanied by room-mate who contributes to history.  The following portions of the chart were reviewed this encounter and updated as appropriate:   Tobacco  Allergies  Meds  Problems  Med Hx  Surg Hx  Fam Hx     Review of Systems:  No other skin or systemic complaints except as noted in HPI or Assessment and Plan.  Objective  Well appearing patient in no apparent distress; mood and affect are within normal limits.  A focused examination was performed including chest. Relevant physical exam findings are noted in the Assessment and Plan.  Objective  Right chest: Pink bx site 3.0 x 3.0cm   Assessment & Plan  Basal cell carcinoma (BCC) of skin of other part of torso Right chest  Skin excision  Lesion length (cm):  3 Lesion width (cm):  3 Margin per side (cm):  0.2 Total excision diameter (cm):  3.4 Informed consent: discussed and consent obtained   Timeout: patient name, date of birth, surgical site, and procedure verified   Procedure prep:  Patient was prepped and draped in usual sterile fashion Prep type:  Isopropyl alcohol and povidone-iodine Anesthesia: the lesion was anesthetized in a standard fashion   Anesthetic:  1% lidocaine w/ epinephrine 1-100,000 buffered w/ 8.4% NaHCO3 (19cc) Instrument used: #15 blade   Hemostasis achieved with: pressure   Hemostasis achieved with comment:  Electrocautery Outcome: patient tolerated procedure well with no complications   Post-procedure details: sterile dressing applied and wound care instructions given   Dressing type: bandage and pressure dressing (Mupirocin)    Skin repair Complexity:  Complex Final length (cm):  6 Reason for type of repair: reduce tension to allow closure, reduce the risk of dehiscence, infection, and necrosis, reduce subcutaneous dead space  and avoid a hematoma, allow closure of the large defect, preserve normal anatomy, preserve normal anatomical and functional relationships and enhance both functionality and cosmetic results   Undermining: area extensively undermined   Undermining comment:  Undermining Defect 3.4cm Subcutaneous layers (deep stitches):  Suture size:  2-0 Suture type: Vicryl (polyglactin 910)   Subcutaneous suture technique: Inverted Dermal. Fine/surface layer approximation (top stitches):  Suture size:  2-0 Suture type: nylon   Stitches: horizontal mattress and simple running   Suture removal (days):  7 Hemostasis achieved with: pressure Outcome: patient tolerated procedure well with no complications   Post-procedure details: sterile dressing applied and wound care instructions given   Dressing type: bandage, pressure dressing and bacitracin (Mupirocin)    mupirocin ointment (BACTROBAN) 2 %  doxycycline (MONODOX) 100 MG capsule  Specimen 1 - Surgical pathology Differential Diagnosis: C44.519 BCC  Check Margins: yes Pink bx site 3.0 x 3.0cm [id number]  Bx proven Start Mupirocin oint qd with dressing changes Start Doxycycline 100mg  1 po bid with food and drink for 7 days  Return in about 1 week (around 06/28/2020) for suture removal.  I, Othelia Pulling, RMA, am acting as scribe for Sarina Ser, MD .  Documentation: I have reviewed the above documentation for accuracy and completeness, and I agree with the above.  Sarina Ser, MD

## 2020-06-21 NOTE — Telephone Encounter (Signed)
Patient doing well after today's surgery./sh 

## 2020-06-22 ENCOUNTER — Encounter: Payer: Self-pay | Admitting: Dermatology

## 2020-06-28 ENCOUNTER — Ambulatory Visit (INDEPENDENT_AMBULATORY_CARE_PROVIDER_SITE_OTHER): Payer: Medicare Other | Admitting: Dermatology

## 2020-06-28 ENCOUNTER — Other Ambulatory Visit: Payer: Self-pay

## 2020-06-28 ENCOUNTER — Encounter: Payer: Self-pay | Admitting: Dermatology

## 2020-06-28 DIAGNOSIS — Z85828 Personal history of other malignant neoplasm of skin: Secondary | ICD-10-CM

## 2020-06-28 NOTE — Progress Notes (Signed)
   Follow-Up Visit   Subjective  Patrick Grant is a 65 y.o. male who presents for the following: Other (Post op - BCC Margins Free of right chest excised 06/21/2020).  Accompanied by friend  The following portions of the chart were reviewed this encounter and updated as appropriate:   Tobacco  Allergies  Meds  Problems  Med Hx  Surg Hx  Fam Hx     Review of Systems:  No other skin or systemic complaints except as noted in HPI or Assessment and Plan.  Objective  Well appearing patient in no apparent distress; mood and affect are within normal limits.  A focused examination was performed including chest. Relevant physical exam findings are noted in the Assessment and Plan.  Objective  Right chest: Healing excision site   Assessment & Plan  History of basal cell carcinoma (BCC) Right chest  Encounter for Removal of Sutures - Incision site at the right chest is clean, dry and intact - Wound cleansed, sutures removed, wound cleansed and steri strips applied.  - Discussed pathology results showing BCC Margins Free  - Patient advised to keep steri-strips dry until they fall off. - Scars remodel for a full year. - Once steri-strips fall off, patient can apply over-the-counter silicone scar cream each night to help with scar remodeling if desired. - Patient advised to call with any concerns or if they notice any new or changing lesions.   Return in about 4 months (around 10/28/2020).   I, Ashok Cordia, CMA, am acting as scribe for Sarina Ser, MD .  Documentation: I have reviewed the above documentation for accuracy and completeness, and I agree with the above.  Sarina Ser, MD

## 2020-08-01 ENCOUNTER — Other Ambulatory Visit: Payer: Self-pay | Admitting: Family Medicine

## 2020-08-01 DIAGNOSIS — E1100 Type 2 diabetes mellitus with hyperosmolarity without nonketotic hyperglycemic-hyperosmolar coma (NKHHC): Secondary | ICD-10-CM

## 2020-09-02 ENCOUNTER — Telehealth: Payer: Self-pay

## 2020-09-02 NOTE — Progress Notes (Signed)
Chronic Care Management Pharmacy Assistant   Name: Patrick Grant  MRN: [id number] DOB: 21-Jul-1955  Reason for Encounter:Diabetes and Hypertension Disease State Call.  Recent office visits:  No recent Office Visit  Recent consult visits:  06/28/2020 [facility] MD (Dermatology)  06/21/2020 Dr.Kowalski MD (Dermatology) start mupirocin ointment (BACTROBAN) 2 %, start doxycycline (MONODOX) 100 MG capsule  Hospital visits:  None in previous 6 months  Medications: Outpatient Encounter Medications as of 09/02/2020  Medication Sig Note   albuterol (VENTOLIN HFA) 108 (90 Base) MCG/ACT inhaler Inhale 2 puffs into the lungs every 6 (six) hours as needed for wheezing or shortness of breath.    empagliflozin (JARDIANCE) 25 MG TABS tablet Take 25 mg by mouth daily.    gabapentin (NEURONTIN) 300 MG capsule Take 1 capsule (300 mg total) by mouth 3 (three) times daily. (Patient not taking: No sig reported)    glucose blood test strip Test blood sugar once daily    lisinopril (ZESTRIL) 10 MG tablet TAKE 1 TABLET EVERY DAY    metFORMIN (GLUCOPHAGE) 1000 MG tablet TAKE 1 TABLET TWICE DAILY WITH MEALS    mometasone-formoterol (DULERA) 100-5 MCG/ACT AERO Inhale 2 puffs into the lungs 2 (two) times daily at 10 AM and 5 PM.    mupirocin ointment (BACTROBAN) 2 % Apply 1 application topically daily. Qd to excision site    nitroGLYCERIN (NITROSTAT) 0.4 MG SL tablet Place 1 tablet (0.4 mg total) under the tongue every 5 (five) minutes as needed. 06/12/2017: Reports has not needed recently    pravastatin (PRAVACHOL) 20 MG tablet TAKE 1 TABLET EVERY DAY    tiotropium (SPIRIVA HANDIHALER) 18 MCG inhalation capsule Place 1 capsule (18 mcg total) into inhaler and inhale daily.    No facility-administered encounter medications on file as of 09/02/2020.    Care Gaps: Patient is schedule for his Annual Wellness Visit on 02/27/2021.  Star Rating Drugs: Metformin 1000 mg last filled on 03/17/2020 for 90 day  supply. Pravastatin 20 mg last filled on 03/17/2020 for 90 day supply. Lisinopril 10 mg last filled on 03/09/2020 for 90 day supply. Jardiance 25 mg no fill history(patient states he receive Jardiance through Patient assistance)  Reviewed chart prior to disease state call. Spoke with patient regarding BP  Recent Office Vitals: BP Readings from Last 3 Encounters:  03/21/20 (!) 158/93  12/15/19 (!) 126/98  06/22/19 (!) 152/78   Pulse Readings from Last 3 Encounters:  03/21/20 94  12/15/19 93  06/22/19 100    Wt Readings from Last 3 Encounters:  03/21/20 225 lb (102.1 kg)  12/15/19 224 lb (101.6 kg)  05/18/19 223 lb (101.2 kg)     Kidney Function Lab Results  Component Value Date/Time   CREATININE 1.16 03/21/2020 09:52 AM   CREATININE 0.95 12/15/2019 11:55 AM   CREATININE 0.92 02/06/2017 09:40 AM   GFRNONAA 67 03/21/2020 09:52 AM   GFRNONAA 91 02/06/2017 09:40 AM   GFRAA 78 03/21/2020 09:52 AM   GFRAA 105 02/06/2017 09:40 AM    BMP Latest Ref Rng & Units 03/21/2020 12/15/2019 06/29/2019  Glucose 65 - 99 mg/dL 160(H) 143(H) 162(H)  BUN 8 - 27 mg/dL 17 13 17   Creatinine 0.76 - 1.27 mg/dL 1.16 0.95 0.96  BUN/Creat Ratio 10 - 24 15 14 18   Sodium 134 - 144 mmol/L 132(L) 136 134  Potassium 3.5 - 5.2 mmol/L 4.7 4.7 4.8  Chloride 96 - 106 mmol/L 97 100 100  CO2 20 - 29 mmol/L 16(L) 19(L) 17(L)  Calcium 8.6 - 10.2 mg/dL 10.1 9.8 9.6    Current antihypertensive regimen:  Lisinopril 10mg  daily Patient denies headaches but reports dizziness from time to time. How often are you checking your Blood Pressure? when feeling symptomatic Current home BP readings:  Patient states he can not remember the last time he check his blood pressure. What recent interventions/DTPs have been made by any provider to improve Blood Pressure control since last CPP Visit: None ID Any recent hospitalizations or ED visits since last visit with CPP? No What diet changes have been made to improve Blood  Pressure Control?  Patient reports he uses very little salt when cooking but enjoys all kinds of pepper, black pepper, red pepper , and cajun pepper.Patient states he loves all kinds of vegetables. What exercise is being done to improve your Blood Pressure Control?  Patient reports his exercise is house hold chores as in cleaning.Patient states he is very limited to moving around due to his leg pain. Patient states he is more active in the summer time because his pain in his leg is not as serve as it is in the fall and winter.Patient reports he has been dealing with his leg pain for the past ten years.  Adherence Review: Is the patient currently on ACE/ARB medication? Yes Does the patient have >5 day gap between last estimated fill dates? Yes  Adherence Issue for Lisinopril: Lisinopril 10 mg last filled on 03/09/2020 for 90 day supply. Patient states he is taking his Lisinopril as prescribe now, but use to forget in the past.Patient states "I am working on taking my medications like I should". Patient confirms his pharmacy is [facility].Patient denies any side effects from his Lisinopril.   Recent Relevant Labs: Lab Results  Component Value Date/Time   HGBA1C 8.4 (H) 03/21/2020 09:52 AM   HGBA1C 9.1 (H) 12/15/2019 11:55 AM   MICROALBUR negative 01/10/2018 03:02 PM   MICROALBUR 20 01/25/2017 01:55 PM    Kidney Function Lab Results  Component Value Date/Time   CREATININE 1.16 03/21/2020 09:52 AM   CREATININE 0.95 12/15/2019 11:55 AM   CREATININE 0.92 02/06/2017 09:40 AM   GFRNONAA 67 03/21/2020 09:52 AM   GFRNONAA 91 02/06/2017 09:40 AM   GFRAA 78 03/21/2020 09:52 AM   GFRAA 105 02/06/2017 09:40 AM    Current antihyperglycemic regimen:  Metformin 1000 mg twice daily with meals Jardiance 25mg  daily  What recent interventions/DTPs have been made to improve glycemic control:  None ID Have there been any recent hospitalizations or ED visits since last visit with CPP? No Patient  denies hypoglycemic symptoms, including Pale, Sweaty, Shaky, Hungry, Nervous/irritable, and Vision changes Patient denies hyperglycemic symptoms, including blurry vision, excessive thirst, fatigue, polyuria, and weakness How often are you checking your blood sugar? twice daily What are your blood sugars ranging?  Fasting:  Patient states he checks his blood sugar between 6 am -7 am ranging around 170. Before meals: N/A After meals: N/A Bedtime:  Patient states he check his blood sugar before he goes to bed ranging around 105 no lower then 105. During the week, how often does your blood glucose drop below 70? Never Are you checking your feet daily/regularly?   Patient reports he always has numbness, pain and tinging in his right foot that shoot down to his feet due to complex regional pain syndrome.  Adherence Review: Is the patient currently on a STATIN medication? Yes Is the patient currently on ACE/ARB medication? Yes Does the patient have >5 day  gap between last estimated fill dates? Yes  Patient Schedule a telephone follow up with clinical pharmacist on 10/21/2020 at 1:00 pm   Adherence Issues for: Metformin 1000 mg last filled on 03/17/2020 for 90 day supply. Pravastatin 20 mg last filled on 03/17/2020 for 90 day supply. Jardiance 25 mg no fill history (patient states he receives patient assistance)   Patient states he is taking his Metformin and Pravastatin as prescribe now, but use to forget in the past.Patient states "I am working on taking my medications like I should". Patient confirms his pharmacy is [facility].Patient denies any side effects from his Metformin or Pravastatin.  Ord Pharmacist Assistant [phone]

## 2020-09-27 ENCOUNTER — Encounter: Payer: Self-pay | Admitting: Family Medicine

## 2020-09-27 DIAGNOSIS — E1100 Type 2 diabetes mellitus with hyperosmolarity without nonketotic hyperglycemic-hyperosmolar coma (NKHHC): Secondary | ICD-10-CM

## 2020-09-27 MED ORDER — EMPAGLIFLOZIN 25 MG PO TABS: 25.0000 mg | ORAL_TABLET | Freq: Every day | ORAL | 3 refills | Status: DC

## 2020-09-27 MED ORDER — SPIRIVA HANDIHALER 18 MCG IN CAPS: 18.0000 ug | ORAL_CAPSULE | Freq: Every day | RESPIRATORY_TRACT | 3 refills | Status: DC

## 2020-09-27 NOTE — Telephone Encounter (Signed)
Patient requesting prescriptions to be faxed to [facility] # [phone].

## 2020-10-10 ENCOUNTER — Other Ambulatory Visit: Payer: Self-pay

## 2020-10-10 ENCOUNTER — Telehealth: Payer: Self-pay

## 2020-10-10 ENCOUNTER — Ambulatory Visit (INDEPENDENT_AMBULATORY_CARE_PROVIDER_SITE_OTHER): Payer: Medicare Other | Admitting: Family Medicine

## 2020-10-10 DIAGNOSIS — Z23 Encounter for immunization: Secondary | ICD-10-CM

## 2020-10-10 NOTE — Progress Notes (Signed)
Vaccine given

## 2020-10-10 NOTE — Telephone Encounter (Signed)
Patient has scheduled appt with Dr. Rosanna Randy today at 3pm> [facility]

## 2020-10-10 NOTE — Telephone Encounter (Signed)
Copied from [place] [id number]. Topic: General - Inquiry >> Oct 10, 2020 11:28 AM Greggory Keen D wrote: Reason for CRM: Pt cut left ankle yesterday on an old grill leg and wants to know if he he to get a tetanus shot.    [phone]

## 2020-10-15 ENCOUNTER — Other Ambulatory Visit: Payer: Self-pay | Admitting: Family Medicine

## 2020-10-21 ENCOUNTER — Telehealth: Payer: Medicare Other

## 2020-11-09 ENCOUNTER — Other Ambulatory Visit: Payer: Self-pay

## 2020-11-09 ENCOUNTER — Ambulatory Visit (INDEPENDENT_AMBULATORY_CARE_PROVIDER_SITE_OTHER): Payer: Medicare Other | Admitting: Dermatology

## 2020-11-09 DIAGNOSIS — Z1283 Encounter for screening for malignant neoplasm of skin: Secondary | ICD-10-CM | POA: Diagnosis not present

## 2020-11-09 DIAGNOSIS — L219 Seborrheic dermatitis, unspecified: Secondary | ICD-10-CM | POA: Diagnosis not present

## 2020-11-09 DIAGNOSIS — D229 Melanocytic nevi, unspecified: Secondary | ICD-10-CM

## 2020-11-09 DIAGNOSIS — L578 Other skin changes due to chronic exposure to nonionizing radiation: Secondary | ICD-10-CM | POA: Diagnosis not present

## 2020-11-09 DIAGNOSIS — L814 Other melanin hyperpigmentation: Secondary | ICD-10-CM | POA: Diagnosis not present

## 2020-11-09 DIAGNOSIS — L821 Other seborrheic keratosis: Secondary | ICD-10-CM

## 2020-11-09 DIAGNOSIS — Z85828 Personal history of other malignant neoplasm of skin: Secondary | ICD-10-CM | POA: Diagnosis not present

## 2020-11-09 DIAGNOSIS — D1800 Hemangioma unspecified site: Secondary | ICD-10-CM | POA: Diagnosis not present

## 2020-11-09 MED ORDER — HYDROCORTISONE 2.5 % EX LOTN: TOPICAL_LOTION | Freq: Every day | CUTANEOUS | 11 refills | Status: DC

## 2020-11-09 MED ORDER — KETOCONAZOLE 2 % EX CREA: 1.0000 "application " | TOPICAL_CREAM | Freq: Every day | CUTANEOUS | 11 refills | Status: DC

## 2020-11-09 NOTE — Progress Notes (Signed)
   Follow-Up Visit   Subjective  Patrick Grant is a 65 y.o. male who presents for the following: Follow-up (History of BCC of right chest - excised 06/21/2020 - UBSE today). The patient presents for Upper Body Skin Exam (UBSE) for skin cancer screening and mole check.  The following portions of the chart were reviewed this encounter and updated as appropriate:   Tobacco  Allergies  Meds  Problems  Med Hx  Surg Hx  Fam Hx     Review of Systems:  No other skin or systemic complaints except as noted in HPI or Assessment and Plan.  Objective  Well appearing patient in no apparent distress; mood and affect are within normal limits.  All skin waist up examined.  Face Pinkness and scale   Assessment & Plan   History of Basal Cell Carcinoma of the Skin - No evidence of recurrence today - Recommend regular full body skin exams - Recommend daily broad spectrum sunscreen SPF 30+ to sun-exposed areas, reapply every 2 hours as needed.  - Call if any new or changing lesions are noted between office visits  Lentigines - Scattered tan macules - Due to sun exposure - Benign-appering, observe - Recommend daily broad spectrum sunscreen SPF 30+ to sun-exposed areas, reapply every 2 hours as needed. - Call for any changes  Seborrheic Keratoses - Stuck-on, waxy, tan-brown papules and/or plaques  - Benign-appearing - Discussed benign etiology and prognosis. - Observe - Call for any changes  Melanocytic Nevi - Tan-brown and/or pink-flesh-colored symmetric macules and papules - Benign appearing on exam today - Observation - Call clinic for new or changing moles - Recommend daily use of broad spectrum spf 30+ sunscreen to sun-exposed areas.   Hemangiomas - Red papules - Discussed benign nature - Observe - Call for any changes  Actinic Damage - Chronic condition, secondary to cumulative UV/sun exposure - diffuse scaly erythematous macules with underlying dyspigmentation -  Recommend daily broad spectrum sunscreen SPF 30+ to sun-exposed areas, reapply every 2 hours as needed.  - Staying in the shade or wearing long sleeves, sun glasses (UVA+UVB protection) and wide brim hats (4-inch brim around the entire circumference of the hat) are also recommended for sun protection.  - Call for new or changing lesions.  Skin cancer screening performed today.  Seborrheic dermatitis Face  Seborrheic Dermatitis  -  is a chronic persistent rash characterized by pinkness and scaling most commonly of the mid face but also can occur on the scalp (dandruff), ears; mid chest and mid back. It tends to be exacerbated by stress and cooler weather.  People who have neurologic disease may experience new onset or exacerbation of existing seborrheic dermatitis.  The condition is not curable but treatable and can be controlled.  Start Ketoconazole 2% cream qd Monday, Wednesday, Friday and Hydrocortisone 2.5% lotion qd Tuesday, Thursday, Saturday   ketoconazole (NIZORAL) 2 % cream - Face Apply 1 application topically daily. Monday, Wednesday, Friday  hydrocortisone 2.5 % lotion - Face Apply topically daily. Tuesday, Thursday, Saturday  Skin cancer screening  Return in about 1 year (around 11/09/2021).  I, Ashok Cordia, CMA, am acting as scribe for Sarina Ser, MD . Documentation: I have reviewed the above documentation for accuracy and completeness, and I agree with the above.  Sarina Ser, MD

## 2020-11-09 NOTE — Patient Instructions (Signed)

## 2020-11-10 ENCOUNTER — Telehealth: Payer: Self-pay

## 2020-11-10 NOTE — Progress Notes (Signed)
    Chronic Care Management Pharmacy Assistant   Name: Patrick Grant  MRN: [id number] DOB: 1955/04/18  Patient called to be reminded of his appointment with [name], CPP on 11/11/2020 @ 0800 via telephone.  No answer, left message of appointment date, time and type of appointment (either telephone or in person). Left message to have all medications, supplements, blood pressure and/or blood sugar logs available during appointment and to return call if need to reschedule.  Star Rating Drug: Lisinopril 10 mg last filled on 03/09/2020 for a 90-Day supply no pharmacy indicated Jardiance 25 mg no fill history Metformin 1000 mg last filled on 03/17/2020 for a 90-Day supply no pharmacy indicated Pravastatin 20 mg last filled on 03/13/2020 for a 90-Day supply no pharmacy indicated  Any gaps in medications fill history? YES  Care Gaps: Ophthalmology Exam Zoster Vaccines PNA (last completed 10/17/20218) COVID-19 Vaccine Booster 3 Foot Exam (last completed 06/22/2019) Hemoglobin A1C (last completed 03/21/2020) Influenza Vaccine (last completed 12/15/2019)  [name], CPA/CMA Clinical Pharmacist Assistant Phone: [phone]

## 2020-11-11 ENCOUNTER — Ambulatory Visit (INDEPENDENT_AMBULATORY_CARE_PROVIDER_SITE_OTHER): Payer: Medicare Other

## 2020-11-11 DIAGNOSIS — E1159 Type 2 diabetes mellitus with other circulatory complications: Secondary | ICD-10-CM

## 2020-11-11 DIAGNOSIS — E785 Hyperlipidemia, unspecified: Secondary | ICD-10-CM | POA: Diagnosis not present

## 2020-11-11 DIAGNOSIS — I152 Hypertension secondary to endocrine disorders: Secondary | ICD-10-CM | POA: Diagnosis not present

## 2020-11-11 DIAGNOSIS — E1169 Type 2 diabetes mellitus with other specified complication: Secondary | ICD-10-CM | POA: Diagnosis not present

## 2020-11-11 DIAGNOSIS — E1165 Type 2 diabetes mellitus with hyperglycemia: Secondary | ICD-10-CM

## 2020-11-11 NOTE — Patient Instructions (Signed)
Visit Information It was great speaking with you today!  Please let me know if you have any questions about our visit.  If you are interested in a new health insurance plan [employer] is a great resource for determining which plan is the best for you. You can call them Mondays-Fridays at [phone].    Goals Addressed             This Visit's Progress    Monitor and Manage My Blood Sugar-Diabetes Type 2       Timeframe:  Long-Range Goal Priority:  High Start Date: 11/11/2020                             Expected End Date: 11/11/2021                       Follow Up Date 12/23/2020    - check blood sugar at prescribed times - check blood sugar if I feel it is too high or too low - enter blood sugar readings and medication or insulin into daily log - take the blood sugar log to all doctor visits    Why is this important?   Checking your blood sugar at home helps to keep it from getting very high or very low.  Writing the results in a diary or log helps the doctor know how to care for you.  Your blood sugar log should have the time, date and the results.  Also, write down the amount of insulin or other medicine that you take.  Other information, like what you ate, exercise done and how you were feeling, will also be helpful.     Notes:         Patient Care Plan: General Pharmacy (Adult)     Problem Identified: Hypertension, Hyperlipidemia, Diabetes, and Pulmonary Emphysema   Priority: High     Long-Range Goal: Patient-Specific Goal   Start Date: 11/11/2020  Expected End Date: 11/11/2021  This Visit's Progress: On track  Priority: High  Note:   Current Barriers:  Unable to independently afford treatment regimen Unable to achieve control of diabetes  Suboptimal therapeutic regimen for cholesterol Unable to achieve control of blood pressure  Pharmacist Clinical Goal(s):  Patient will verbalize ability to afford treatment regimen achieve control of diabetes as evidenced  by A1c less than 7% achieve control of blood pressure as evidenced by BP less than 130/80 through collaboration with PharmD and provider.   Interventions: 1:1 collaboration with Chrismon, Vickki Muff, PA-C regarding development and update of comprehensive plan of care as evidenced by provider attestation and co-signature Inter-disciplinary care team collaboration (see longitudinal plan of care) Comprehensive medication review performed; medication list updated in electronic medical record  Hypertension (BP goal <140/90) -Uncontrolled -Current treatment: Lisinopril 10 mg daily  -Medications previously tried: NA  -Current home readings: Not monitoring regularly  -Denies hypotensive/hypertensive symptoms -Counseled to monitor BP at home weekly, document, and provide log at future appointments -Recommend increasing lisinopril to 20 mg daily if persistently elevated   Hyperlipidemia: (LDL goal < 70) -Uncontrolled -Current treatment: Pravastatin 20 mg daily  -Medications previously tried: NA  -Given high risk of ASCVD, patient is a candidate for high intensity statin. -Recommend stopping pravastatin, starting rosuvastatin 20 mg daily   Diabetes (A1c goal <7%) -Uncontrolled -Current medications: Jardiance 25 mg daily  Metformin 1000 mg twice daily -Medications previously tried: NA  -Current home glucose readings fasting  glucose: 150-160s  -Denies hypoglycemic/hyperglycemic symptoms -Recommend Trulicity [id number] mg weekly if A1c persistently elevated  Patient Goals/Self-Care Activities Patient will:  - check glucose twice daily, before meals, document, and provide at future appointments check blood pressure weekly, document, and provide at future appointments  Follow Up Plan: Telephone follow up appointment with care management team member scheduled for:  01/06/2021 at 11:00 AM    Patient agreed to services and verbal consent obtained.   Patient verbalizes understanding of  instructions provided today and agrees to view in [place].   [name], PharmD, [place], [place] [phone]

## 2020-11-11 NOTE — Progress Notes (Signed)
Chronic Care Management Pharmacy Note  11/11/2020 Name:  Patrick Grant MRN:  [id number] DOB:  1955/06/17  Summary: Patient presents for CCM follow-up. He has had multiple family hardships recently. He is also concerned about worsening memory recently and worsening shortness of breath after meals.   Recommendations/Changes made from today's visit: -Recommend increasing lisinopril to 20 mg daily if persistently elevated  -Recommend stopping pravastatin, starting rosuvastatin 20 mg daily  -Recommend Trulicity 4.12 mg weekly if A1c persistently elevated -Patient due for updated labwork.   Plan: Will reach out to set up PCP follow-up CPP follow-up 2 months to discuss changes after updated labwork.   Subjective: Patrick Grant is an 65 y.o. year old male who is a primary patient of Chrismon, Vickki Muff, PA-C.  The CCM team was consulted for assistance with disease management and care coordination needs.    Engaged with patient by telephone for follow up visit in response to provider referral for pharmacy case management and/or care coordination services.   Consent to Services:  The patient was given information about Chronic Care Management services, agreed to services, and gave verbal consent prior to initiation of services.  Please see initial visit note for detailed documentation.   Patient Care Team: Chrismon, Driscilla Grammes as PCP - General (Family Medicine) Allyne Gee, MD as Consulting Physician (Internal Medicine) Pa, [facility]) Jonathon Bellows, MD as Consulting Physician (Gastroenterology)  Recent office visits: 03/21/20: Patient presented to Vernie Murders, PA-C for follow-up.   Recent consult visits: None in previous 6 months  Hospital visits: None in previous 6 months   Objective:  Lab Results  Component Value Date   CREATININE 1.16 03/21/2020   BUN 17 03/21/2020   GFRNONAA 67 03/21/2020   GFRAA 78 03/21/2020   NA 132 (L) 03/21/2020    K 4.7 03/21/2020   CALCIUM 10.1 03/21/2020   CO2 16 (L) 03/21/2020   GLUCOSE 160 (H) 03/21/2020    Lab Results  Component Value Date/Time   HGBA1C 8.4 (H) 03/21/2020 09:52 AM   HGBA1C 9.1 (H) 12/15/2019 11:55 AM   MICROALBUR negative 01/10/2018 03:02 PM   MICROALBUR 20 01/25/2017 01:55 PM    Last diabetic Eye exam: No results found for: HMDIABEYEEXA  Last diabetic Foot exam: No results found for: HMDIABFOOTEX   Lab Results  Component Value Date   CHOL 176 12/15/2019   HDL 48 12/15/2019   LDLCALC 104 (H) 12/15/2019   TRIG 133 12/15/2019   CHOLHDL 3.7 12/15/2019    Hepatic Function Latest Ref Rng & Units 03/21/2020 12/15/2019 06/29/2019  Total Protein 6.0 - 8.5 g/dL 7.7 7.2 7.0  Albumin 3.8 - 4.8 g/dL 5.1(H) 5.1(H) 4.7  AST 0 - 40 IU/L 20 16 15   ALT 0 - 44 IU/L 24 24 24   Alk Phosphatase 44 - 121 IU/L 96 94 89  Total Bilirubin 0.0 - 1.2 mg/dL 0.5 0.5 0.5    Lab Results  Component Value Date/Time   TSH 1.420 03/21/2020 09:52 AM   TSH 0.927 12/15/2019 11:55 AM    CBC Latest Ref Rng & Units 03/21/2020 12/15/2019 06/29/2019  WBC 3.4 - 10.8 x10E3/uL 11.0(H) 9.1 9.4  Hemoglobin 13.0 - 17.7 g/dL 16.4 15.8 14.9  Hematocrit 37.5 - 51.0 % 48.5 46.3 43.7  Platelets 150 - 450 x10E3/uL 377 354 353    No results found for: VD25OH  Clinical ASCVD: No  The 10-year ASCVD risk score Mikey Bussing DC Jr., et al., 2013) is: 27.8%  Values used to calculate the score:     Age: 52 years     Sex: Male     Is Non-Hispanic African American: No     Diabetic: Yes     Tobacco smoker: No     Systolic Blood Pressure: 188 mmHg     Is BP treated: Yes     HDL Cholesterol: 48 mg/dL     Total Cholesterol: 176 mg/dL    Depression screen [facility] 2/9 03/21/2020 02/23/2020 12/15/2019  Decreased Interest 0 0 0  Down, Depressed, Hopeless 0 1 0  PHQ - 2 Score 0 1 0  Altered sleeping 1 - 0  Tired, decreased energy 0 - 0  Change in appetite 0 - 0  Feeling bad or failure about yourself  0 - 0  Trouble  concentrating 0 - 0  Moving slowly or fidgety/restless 0 - 0  Suicidal thoughts 0 - 0  PHQ-9 Score 1 - 0  Difficult doing work/chores Not difficult at all - Not difficult at all    Social History   Tobacco Use  Smoking Status Former   Types: Cigars   Quit date: 03/12/2016   Years since quitting: 4.6  Smokeless Tobacco Never   BP Readings from Last 3 Encounters:  03/21/20 (!) 158/93  12/15/19 (!) 126/98  06/22/19 (!) 152/78   Pulse Readings from Last 3 Encounters:  03/21/20 94  12/15/19 93  06/22/19 100   Wt Readings from Last 3 Encounters:  03/21/20 225 lb (102.1 kg)  12/15/19 224 lb (101.6 kg)  05/18/19 223 lb (101.2 kg)   BMI Readings from Last 3 Encounters:  03/21/20 30.52 kg/m  12/15/19 29.55 kg/m  05/18/19 29.42 kg/m    Assessment/Interventions: Review of patient past medical history, allergies, medications, health status, including review of consultants reports, laboratory and other test data, was performed as part of comprehensive evaluation and provision of chronic care management services.   SDOH:  (Social Determinants of Health) assessments and interventions performed: Yes SDOH Interventions    Flowsheet Row Most Recent Value  SDOH Interventions   Financial Strain Interventions Intervention Not Indicated      SDOH Screenings   Alcohol Screen: Low Risk    Last Alcohol Screening Score (AUDIT): 0  Depression (PHQ2-9): Low Risk    PHQ-2 Score: 1  Financial Resource Strain: Low Risk    Difficulty of Paying Living Expenses: Not hard at all  Food Insecurity: No Food Insecurity   Worried About Charity fundraiser in the Last Year: Never true   Ran Out of Food in the Last Year: Never true  Housing: Low Risk    Last Housing Risk Score: 0  Physical Activity: Inactive   Days of Exercise per Week: 0 days   Minutes of Exercise per Session: 0 min  Social Connections: Moderately Isolated   Frequency of Communication with Friends and Family: Never    Frequency of Social Gatherings with Friends and Family: More than three times a week   Attends Religious Services: Never   Marine scientist or Organizations: No   Attends Archivist Meetings: Never   Marital Status: Living with partner  Stress: Stress Concern Present   Feeling of Stress : To some extent  Tobacco Use: Medium Risk   Smoking Tobacco Use: Former   Smokeless Tobacco Use: Never  Transportation Needs: No Data processing manager (Medical): No   Lack of Transportation (Non-Medical): No    CCM Care Plan  Allergies  Allergen Reactions   Almond (Diagnostic) Swelling    throat   Fentanyl Other (See Comments)    Other reaction(s): Jittery   Clove Flavor Other (See Comments)    Loss of vision for 1-2 hrs    Medications Reviewed Today     Reviewed by Arvin Collard, CMA (Certified Medical Assistant) on 11/09/20 at 1508  Med List Status: <None>   Medication Order Taking? Sig Documenting Provider Last Dose Status Informant  albuterol (VENTOLIN HFA) 108 (90 Base) MCG/ACT inhaler [id number]  Inhale 2 puffs into the lungs every 6 (six) hours as needed for wheezing or shortness of breath. Chrismon, Vickki Muff, PA-C  Active   empagliflozin (JARDIANCE) 25 MG TABS tablet [id number]  Take 1 tablet (25 mg total) by mouth daily. Chrismon, Vickki Muff, PA-C  Active   gabapentin (NEURONTIN) 300 MG capsule [id number] No Take 1 capsule (300 mg total) by mouth 3 (three) times daily.  Patient not taking: No sig reported   Virginia Crews, MD Not Taking Active   glucose blood test strip [id number] No Test blood sugar once daily Jerrol Banana., MD Taking Active   lisinopril (ZESTRIL) 10 MG tablet [id number]  TAKE 1 TABLET EVERY DAY Chrismon, Vickki Muff, PA-C  Active   metFORMIN (GLUCOPHAGE) 1000 MG tablet [id number]  TAKE 1 TABLET TWICE DAILY WITH MEALS Chrismon, Vickki Muff, PA-C  Active   mometasone-formoterol (DULERA) 100-5 MCG/ACT Hollie Salk [id number]  Inhale 2  puffs into the lungs 2 (two) times daily at 10 AM and 5 PM. Chrismon, Vickki Muff, PA-C  Active   mupirocin ointment (BACTROBAN) 2 % [id number]  Apply 1 application topically daily. Qd to excision site Ralene Bathe, MD  Active   nitroGLYCERIN (NITROSTAT) 0.4 MG SL tablet [id number] No Place 1 tablet (0.4 mg total) under the tongue every 5 (five) minutes as needed. Wende Bushy, MD Taking Active            Med Note Knox Royalty   Wed Jun 12, 2017 10:09 AM) Reports has not needed recently   pravastatin (PRAVACHOL) 20 MG tablet [id number]  TAKE 1 TABLET EVERY DAY Chrismon, Vickki Muff, PA-C  Active   tiotropium (SPIRIVA HANDIHALER) 18 MCG inhalation capsule [id number]  Place 1 capsule (18 mcg total) into inhaler and inhale daily. Chrismon, Vickki Muff, [place]  Active             Patient Active Problem List   Diagnosis Date Noted   Dizziness 09/02/2017   Pulmonary emphysema ([place]) 07/05/2017   Scrotal pain, Right 04/19/2016   Prostate cancer screening 04/19/2016   Cervical pain 02/08/2015   Cervical nerve root disorder 02/08/2015   CRPS (complex regional pain syndrome), lower limb 02/08/2015   Bleeding from the nose 02/08/2015   Reflex sympathetic dystrophy of lower extremity 02/08/2015   Type 2 diabetes mellitus ([place]) 02/08/2015   Combined fat and carbohydrate induced hyperlipemia 04/06/2008   Elevated fasting blood sugar 10/10/2007   Essential (primary) hypertension 09/30/2007    Immunization History  Administered Date(s) Administered   Influenza,inj,Quad PF,6+ Mos 01/01/2014, 02/05/2015, 01/31/2016, 01/09/2017, 01/10/2018, 12/15/2019   PFIZER(Purple Top)SARS-COV-2 Vaccination 06/22/2019, 07/21/2019   Pneumococcal Polysaccharide-23 01/09/2017   Tdap 10/10/2020    Conditions to be addressed/monitored:  Hypertension, Hyperlipidemia, Diabetes, and Pulmonary Emphysema   Care Plan : General Pharmacy (Adult)  Updates made by Germaine Pomfret, [place] since 11/11/2020 12:00 AM      Problem: Hypertension, Hyperlipidemia, Diabetes, and Pulmonary Emphysema   Priority: High  Long-Range Goal: Patient-Specific Goal   Start Date: 11/11/2020  Expected End Date: 11/11/2021  This Visit's Progress: On track  Priority: High  Note:   Current Barriers:  Unable to independently afford treatment regimen Unable to achieve control of diabetes  Suboptimal therapeutic regimen for cholesterol Unable to achieve control of blood pressure  Pharmacist Clinical Goal(s):  Patient will verbalize ability to afford treatment regimen achieve control of diabetes as evidenced by A1c less than 7% achieve control of blood pressure as evidenced by BP less than 130/80 through collaboration with PharmD and provider.   Interventions: 1:1 collaboration with Chrismon, Vickki Muff, PA-C regarding development and update of comprehensive plan of care as evidenced by provider attestation and co-signature Inter-disciplinary care team collaboration (see longitudinal plan of care) Comprehensive medication review performed; medication list updated in electronic medical record  Hypertension (BP goal <140/90) -Uncontrolled -Current treatment: Lisinopril 10 mg daily  -Medications previously tried: NA  -Current home readings: Not monitoring regularly  -Denies hypotensive/hypertensive symptoms -Counseled to monitor BP at home weekly, document, and provide log at future appointments -Recommend increasing lisinopril to 20 mg daily if persistently elevated   Hyperlipidemia: (LDL goal < 70) -Uncontrolled -Current treatment: Pravastatin 20 mg daily  -Medications previously tried: NA  -Given high risk of ASCVD, patient is a candidate for high intensity statin. -Recommend stopping pravastatin, starting rosuvastatin 20 mg daily   Diabetes (A1c goal <7%) -Uncontrolled -Current medications: Jardiance 25 mg daily  Metformin 1000 mg twice daily -Medications previously tried: NA  -Current home glucose  readings fasting glucose: 150-160s  -Denies hypoglycemic/hyperglycemic symptoms -Recommend Trulicity 7.32 mg weekly if A1c persistently elevated  Patient Goals/Self-Care Activities Patient will:  - check glucose twice daily, before meals, document, and provide at future appointments check blood pressure weekly, document, and provide at future appointments  Follow Up Plan: Telephone follow up appointment with care management team member scheduled for:  01/06/2021 at 11:00 AM      Medication Assistance:  Arlina Robes obtained through [place] medication assistance program.  Enrollment ends Dec 2022  Compliance/Adherence/Medication fill history: Care Gaps: Ophthalmology Exam Zoster Vaccines PNA (last completed 10/17/20218) COVID-19 Vaccine Booster 3 Foot Exam (last completed 06/22/2019) Hemoglobin A1C (last completed 03/21/2020) Influenza Vaccine (last completed 12/15/2019)  Star-Rating Drugs: Lisinopril 10 mg last filled on 03/09/2020 for a 90-Day supply no pharmacy indicated Jardiance 25 mg no fill history Metformin 1000 mg last filled on 03/17/2020 for a 90-Day supply no pharmacy indicated Pravastatin 20 mg last filled on 03/13/2020 for a 90-Day supply no pharmacy indicated  Patient's preferred pharmacy is:  [facility], [place] [address] Phone: [phone] Fax: [facility] [address] [place] - [place] [address] Phone: [phone] Fax: [phone]  [facility] ([facility] - [place], [place] [address] Phone: [phone] Fax: [phone]  [facility] [place], [place] - [address] [place] [address] Phone: [phone] Fax: [phone]  Uses pill box? Yes Pt  endorses 100% compliance  We discussed: Current pharmacy is preferred with insurance plan and patient is satisfied with pharmacy services Patient decided to: Continue current medication management strategy  Care Plan and Follow Up Patient Decision:  Patient agrees to Care Plan and Follow-up.  Plan: Telephone follow up appointment with care management  team member scheduled for:  01/06/2021 at 11:00 AM  [name], PharmD, Para [place], [place] [phone]

## 2020-11-13 ENCOUNTER — Encounter: Payer: Self-pay | Admitting: Dermatology

## 2020-12-01 ENCOUNTER — Other Ambulatory Visit: Payer: Self-pay

## 2020-12-01 ENCOUNTER — Ambulatory Visit (INDEPENDENT_AMBULATORY_CARE_PROVIDER_SITE_OTHER): Payer: Medicare Other | Admitting: Family Medicine

## 2020-12-01 ENCOUNTER — Encounter: Payer: Self-pay | Admitting: Family Medicine

## 2020-12-01 VITALS — BP 130/87 | HR 86 | Temp 98.4°F | Wt 225.0 lb

## 2020-12-01 DIAGNOSIS — E1169 Type 2 diabetes mellitus with other specified complication: Secondary | ICD-10-CM | POA: Diagnosis not present

## 2020-12-01 DIAGNOSIS — G5772 Causalgia of left lower limb: Secondary | ICD-10-CM

## 2020-12-01 DIAGNOSIS — E785 Hyperlipidemia, unspecified: Secondary | ICD-10-CM

## 2020-12-01 DIAGNOSIS — E1100 Type 2 diabetes mellitus with hyperosmolarity without nonketotic hyperglycemic-hyperosmolar coma (NKHHC): Secondary | ICD-10-CM | POA: Diagnosis not present

## 2020-12-01 DIAGNOSIS — E1159 Type 2 diabetes mellitus with other circulatory complications: Secondary | ICD-10-CM

## 2020-12-01 DIAGNOSIS — I152 Hypertension secondary to endocrine disorders: Secondary | ICD-10-CM | POA: Diagnosis not present

## 2020-12-01 NOTE — Progress Notes (Signed)
Established patient visit   Patient: Patrick Grant   DOB: 1955-06-08   65 y.o. Male  MRN: [id number] Visit Date: 12/01/2020  Today's healthcare provider: Vernie Murders, PA-C   Chief Complaint  Patient presents with   Diabetes   Subjective    HPI  Diabetes Mellitus Type II, Follow-up  Lab Results  Component Value Date   HGBA1C 8.4 (H) 03/21/2020   HGBA1C 9.1 (H) 12/15/2019   HGBA1C 8.1 (H) 06/29/2019   Wt Readings from Last 3 Encounters:  12/01/20 225 lb (102.1 kg)  03/21/20 225 lb (102.1 kg)  12/15/19 224 lb (101.6 kg)   Last seen for diabetes 9 months ago.  Management since then includes no changes. He reports excellent compliance with treatment. He is not having side effects.  Symptoms: No fatigue No foot ulcerations  No appetite changes No nausea  No paresthesia of the feet  No polydipsia  No polyuria No visual disturbances   No vomiting     Home blood sugar records: fasting range: mid 100's   Episodes of hypoglycemia? No    Current insulin regiment: None Most Recent Eye Exam: Pt is due for an eye exam.  Current exercise: none Current diet habits: in general, a "healthy" diet    Pertinent Labs: Lab Results  Component Value Date   CHOL 176 12/15/2019   HDL 48 12/15/2019   LDLCALC 104 (H) 12/15/2019   TRIG 133 12/15/2019   CHOLHDL 3.7 12/15/2019   Lab Results  Component Value Date   NA 132 (L) 03/21/2020   K 4.7 03/21/2020   CREATININE 1.16 03/21/2020   GFRNONAA 67 03/21/2020   GFRAA 78 03/21/2020   GLUCOSE 160 (H) 03/21/2020     ---------------------------------------------------------------------------------------------------  Past Medical History:  Diagnosis Date   BCC (basal cell carcinoma) 05/16/2020   right chest. Nodular, EXC 06/21/20   COPD (chronic obstructive pulmonary disease) (HCC)    CRPS (complex regional pain syndrome)    Diabetes mellitus without complication ([place])    Dizzy spells    occasional    Hyperlipidemia    Hypertension    Shingles 04/17/2019   Sleep apnea    Uses crutches    or wheelchair   Past Surgical History:  Procedure Laterality Date   COLONOSCOPY WITH PROPOFOL N/A 05/18/2019   Procedure: COLONOSCOPY WITH PROPOFOL;  Surgeon: Jonathon Bellows, MD;  Location: [facility];  Service: Endoscopy;  Laterality: N/A;  Diabetic - oral meds sleep apnea   POLYPECTOMY N/A 05/18/2019   Procedure: POLYPECTOMY;  Surgeon: Jonathon Bellows, MD;  Location: [facility];  Service: Endoscopy;  Laterality: N/A;   SPINAL CORD STIMULATOR REMOVAL  2010   DUE TO LOSS OF MUSCLE USE WHEN ACTIVATED   Family History  Problem Relation Age of Onset   CVA Mother    Kidney failure Mother    Diabetes Mother    CVA Father    Kidney failure Father    Colon cancer Father    Ovarian cancer Sister    Diabetes Brother    Healthy Sister    Social History   Tobacco Use   Smoking status: Former    Types: Cigars    Quit date: 03/12/2016    Years since quitting: 4.7   Smokeless tobacco: Never  Vaping Use   Vaping Use: Never used  Substance Use Topics   Alcohol use: No    Alcohol/week: 0.0 standard drinks   Drug use: No   Allergies  Allergen Reactions  Almond (Diagnostic) Swelling    throat   Fentanyl Other (See Comments)    Other reaction(s): Jittery   Clove Flavor Other (See Comments)    Loss of vision for 1-2 hrs   Medications: Outpatient Medications Prior to Visit  Medication Sig   albuterol (VENTOLIN HFA) 108 (90 Base) MCG/ACT inhaler Inhale 2 puffs into the lungs every 6 (six) hours as needed for wheezing or shortness of breath.   empagliflozin (JARDIANCE) 25 MG TABS tablet Take 1 tablet (25 mg total) by mouth daily.   glucose blood test strip Test blood sugar once daily   hydrocortisone 2.5 % lotion Apply topically daily. Tuesday, Thursday, Saturday   ketoconazole (NIZORAL) 2 % cream Apply 1 application topically daily. Monday, Wednesday, Friday   lisinopril (ZESTRIL)  10 MG tablet TAKE 1 TABLET EVERY DAY   metFORMIN (GLUCOPHAGE) 1000 MG tablet TAKE 1 TABLET TWICE DAILY WITH MEALS   mometasone-formoterol (DULERA) 100-5 MCG/ACT AERO Inhale 2 puffs into the lungs 2 (two) times daily at 10 AM and 5 PM.   mupirocin ointment (BACTROBAN) 2 % Apply 1 application topically daily. Qd to excision site   nitroGLYCERIN (NITROSTAT) 0.4 MG SL tablet Place 1 tablet (0.4 mg total) under the tongue every 5 (five) minutes as needed.   pravastatin (PRAVACHOL) 20 MG tablet TAKE 1 TABLET EVERY DAY   tiotropium (SPIRIVA HANDIHALER) 18 MCG inhalation capsule Place 1 capsule (18 mcg total) into inhaler and inhale daily.   No facility-administered medications prior to visit.   Review of Systems  Constitutional: Negative.   Respiratory:  Positive for shortness of breath. Negative for apnea, cough, choking, chest tightness, wheezing and stridor.   Cardiovascular:  Positive for leg swelling. Negative for chest pain and palpitations.  Gastrointestinal: Negative.   Endocrine: Negative.   Musculoskeletal:  Positive for gait problem.  Neurological:  Positive for light-headedness. Negative for dizziness, seizures, speech difficulty and headaches.     Objective    BP 130/87 (BP Location: Right Arm, Patient Position: Sitting, Cuff Size: Large)   Pulse 86   Temp 98.4 F (36.9 C) (Oral)   Wt 225 lb (102.1 kg)   SpO2 100%   BMI 30.52 kg/m  BP Readings from Last 3 Encounters:  12/01/20 130/87  03/21/20 (!) 158/93  12/15/19 (!) 126/98   Wt Readings from Last 3 Encounters:  12/01/20 225 lb (102.1 kg)  03/21/20 225 lb (102.1 kg)  12/15/19 224 lb (101.6 kg)   Physical Exam Constitutional:      General: He is not in acute distress.    Appearance: He is well-developed.  HENT:     Head: Normocephalic and atraumatic.     Right Ear: Hearing and tympanic membrane normal.     Left Ear: Hearing and tympanic membrane normal.     Nose: Nose normal.     Mouth/Throat:     Pharynx:  Oropharynx is clear.  Eyes:     General: Lids are normal. No scleral icterus.       Right eye: No discharge.        Left eye: No discharge.     Conjunctiva/sclera: Conjunctivae normal.  Cardiovascular:     Rate and Rhythm: Normal rate and regular rhythm.     Heart sounds: Normal heart sounds.  Pulmonary:     Effort: Pulmonary effort is normal. No respiratory distress.     Breath sounds: Normal breath sounds.  Abdominal:     General: Bowel sounds are normal.     Palpations:  Abdomen is soft.  Musculoskeletal:        General: Tenderness present. Normal range of motion.     Cervical back: Neck supple.     Comments: Pain in the left foot due to CRPS  Skin:    Findings: No lesion or rash.  Neurological:     Mental Status: He is alert and oriented to person, place, and time.  Psychiatric:        Speech: Speech normal.        Behavior: Behavior normal.        Thought Content: Thought content normal.    No results found for any visits on 12/01/20.  Assessment & Plan     1. Type 2 diabetes mellitus with hyperosmolarity without coma, without long-term current use of insulin (HCC) Tolerating  Jardiance 25 mg qd with Metformin 1000 mg BID. Still on Lisinopril and Pravastatin. Unable to assess feet for neuropathy due to CRPS. Recheck labs. - CBC with Differential/Platelet - Comprehensive metabolic panel - Hemoglobin A1c - Lipid panel  2. Complex regional pain syndrome type 2 of left lower extremity Onset in 2008 following a fracture in the left foot. Essentially unchanged chronic pain. Uses a crutch for support when walking. Recognizes increase in pain level with barometric weather changes. - CBC with Differential/Platelet - Comprehensive metabolic panel  3. Hyperlipidemia associated with type 2 diabetes mellitus (HCC) Tolerating Pravastatin without side effects. Recheck labs and continue low fat diet. - CBC with Differential/Platelet - Comprehensive metabolic panel - Lipid panel -  TSH  4. Hypertension associated with type 2 diabetes mellitus (HCC) Continues Lisinopril 10 mg qd with good control of BP. Check routine labs. - CBC with Differential/Platelet - Comprehensive metabolic panel - Lipid panel - TSH   No follow-ups on file.      I, Shontia Gillooly, PA-C, have reviewed all documentation for this visit. The documentation on 12/01/20 for the exam, diagnosis, procedures, and orders are all accurate and complete.    Vernie Murders, PA-C  [employer] [phone] (phone) [phone] (fax)  [place]

## 2020-12-02 DIAGNOSIS — E1169 Type 2 diabetes mellitus with other specified complication: Secondary | ICD-10-CM | POA: Diagnosis not present

## 2020-12-02 DIAGNOSIS — G5772 Causalgia of left lower limb: Secondary | ICD-10-CM | POA: Diagnosis not present

## 2020-12-02 DIAGNOSIS — E785 Hyperlipidemia, unspecified: Secondary | ICD-10-CM | POA: Diagnosis not present

## 2020-12-02 DIAGNOSIS — E1100 Type 2 diabetes mellitus with hyperosmolarity without nonketotic hyperglycemic-hyperosmolar coma (NKHHC): Secondary | ICD-10-CM | POA: Diagnosis not present

## 2020-12-02 DIAGNOSIS — I152 Hypertension secondary to endocrine disorders: Secondary | ICD-10-CM | POA: Diagnosis not present

## 2020-12-02 DIAGNOSIS — E1159 Type 2 diabetes mellitus with other circulatory complications: Secondary | ICD-10-CM | POA: Diagnosis not present

## 2020-12-03 LAB — COMPREHENSIVE METABOLIC PANEL
ALT: 20 IU/L (ref 0–44)
AST: 11 IU/L (ref 0–40)
Albumin/Globulin Ratio: 2.6 — ABNORMAL HIGH (ref 1.2–2.2)
Albumin: 4.9 g/dL — ABNORMAL HIGH (ref 3.8–4.8)
Alkaline Phosphatase: 102 IU/L (ref 44–121)
BUN/Creatinine Ratio: 16 (ref 10–24)
BUN: 16 mg/dL (ref 8–27)
Bilirubin Total: 0.5 mg/dL (ref 0.0–1.2)
CO2: 19 mmol/L — ABNORMAL LOW (ref 20–29)
Calcium: 9.6 mg/dL (ref 8.6–10.2)
Chloride: 100 mmol/L (ref 96–106)
Creatinine, Ser: 0.97 mg/dL (ref 0.76–1.27)
Globulin, Total: 1.9 g/dL (ref 1.5–4.5)
Glucose: 167 mg/dL — ABNORMAL HIGH (ref 65–99)
Potassium: 4.6 mmol/L (ref 3.5–5.2)
Sodium: 135 mmol/L (ref 134–144)
Total Protein: 6.8 g/dL (ref 6.0–8.5)
eGFR: 88 mL/min/{1.73_m2} (ref >59)

## 2020-12-03 LAB — CBC WITH DIFFERENTIAL/PLATELET
Basophils Absolute: 0.1 10*3/uL (ref 0.0–0.2)
Basos: 1 %
EOS (ABSOLUTE): 0.3 10*3/uL (ref 0.0–0.4)
Eos: 4 %
Hematocrit: 44.4 % (ref 37.5–51.0)
Hemoglobin: 14.9 g/dL (ref 13.0–17.7)
Immature Grans (Abs): 0 10*3/uL (ref 0.0–0.1)
Immature Granulocytes: 0 %
Lymphocytes Absolute: 2.3 10*3/uL (ref 0.7–3.1)
Lymphs: 28 %
MCH: 30.4 pg (ref 26.6–33.0)
MCHC: 33.6 g/dL (ref 31.5–35.7)
MCV: 91 fL (ref 79–97)
Monocytes Absolute: 0.6 10*3/uL (ref 0.1–0.9)
Monocytes: 7 %
Neutrophils Absolute: 5.1 10*3/uL (ref 1.4–7.0)
Neutrophils: 60 %
Platelets: 334 10*3/uL (ref 150–450)
RBC: 4.9 x10E6/uL (ref 4.14–5.80)
RDW: 12.8 % (ref 11.6–15.4)
WBC: 8.3 10*3/uL (ref 3.4–10.8)

## 2020-12-03 LAB — LIPID PANEL
Chol/HDL Ratio: 3.7 ratio (ref 0.0–5.0)
Cholesterol, Total: 171 mg/dL (ref 100–199)
HDL: 46 mg/dL (ref >39)
LDL Chol Calc (NIH): 101 mg/dL — ABNORMAL HIGH (ref 0–99)
Triglycerides: 134 mg/dL (ref 0–149)
VLDL Cholesterol Cal: 24 mg/dL (ref 5–40)

## 2020-12-03 LAB — HEMOGLOBIN A1C
Est. average glucose Bld gHb Est-mCnc: 206 mg/dL
Hgb A1c MFr Bld: 8.8 % — ABNORMAL HIGH (ref 4.8–5.6)

## 2020-12-03 LAB — TSH: TSH: 1.17 u[IU]/mL (ref 0.450–4.500)

## 2020-12-06 ENCOUNTER — Other Ambulatory Visit: Payer: Self-pay | Admitting: Family Medicine

## 2020-12-06 DIAGNOSIS — E1100 Type 2 diabetes mellitus with hyperosmolarity without nonketotic hyperglycemic-hyperosmolar coma (NKHHC): Secondary | ICD-10-CM

## 2020-12-06 MED ORDER — GLIMEPIRIDE 1 MG PO TABS
1.0000 mg | ORAL_TABLET | Freq: Every day | ORAL | 3 refills | Status: DC
Start: 2020-12-06 — End: 2020-12-23

## 2020-12-21 ENCOUNTER — Telehealth: Payer: Self-pay

## 2020-12-21 DIAGNOSIS — E1100 Type 2 diabetes mellitus with hyperosmolarity without nonketotic hyperglycemic-hyperosmolar coma (NKHHC): Secondary | ICD-10-CM

## 2020-12-21 NOTE — Progress Notes (Signed)
    Chronic Care Management Pharmacy Assistant   Name: Patrick Grant  MRN: [id number] DOB: Nov 06, 1955  Reason for Encounter: Medication Review/General Adherence Call.   Recent office visits:  12/06/2020 Vernie Murders PA-C (PCP) start Glimepiride 1 mg daily with breakfast 12/01/2020 Vernie Murders PA-C (PCP)   Recent consult visits:  No recent [facility] visits:  None in previous 6 months  Medications: Outpatient Encounter Medications as of 12/21/2020  Medication Sig Note   albuterol (VENTOLIN HFA) 108 (90 Base) MCG/ACT inhaler Inhale 2 puffs into the lungs every 6 (six) hours as needed for wheezing or shortness of breath.    empagliflozin (JARDIANCE) 25 MG TABS tablet Take 1 tablet (25 mg total) by mouth daily.    glimepiride (AMARYL) 1 MG tablet Take 1 tablet (1 mg total) by mouth daily with breakfast.    glucose blood test strip Test blood sugar once daily    hydrocortisone 2.5 % lotion Apply topically daily. Tuesday, Thursday, Saturday    ketoconazole (NIZORAL) 2 % cream Apply 1 application topically daily. Monday, Wednesday, Friday    lisinopril (ZESTRIL) 10 MG tablet TAKE 1 TABLET EVERY DAY    metFORMIN (GLUCOPHAGE) 1000 MG tablet TAKE 1 TABLET TWICE DAILY WITH MEALS    mometasone-formoterol (DULERA) 100-5 MCG/ACT AERO Inhale 2 puffs into the lungs 2 (two) times daily at 10 AM and 5 PM.    mupirocin ointment (BACTROBAN) 2 % Apply 1 application topically daily. Qd to excision site    nitroGLYCERIN (NITROSTAT) 0.4 MG SL tablet Place 1 tablet (0.4 mg total) under the tongue every 5 (five) minutes as needed. 06/12/2017: Reports has not needed recently    pravastatin (PRAVACHOL) 20 MG tablet TAKE 1 TABLET EVERY DAY    tiotropium (SPIRIVA HANDIHALER) 18 MCG inhalation capsule Place 1 capsule (18 mcg total) into inhaler and inhale daily.    No facility-administered encounter medications on file as of 12/21/2020.     Care Gaps: Ophthalmology Exam Zoster  Vaccines COVID-19 Vaccine Booster 3 Foot Exam (last completed 06/22/2019) Influenza Vaccine (last completed 12/15/2019) Star Rating Drugs: Lisinopril 10 mg last filled on 08/03/2020 for a 90-Day supply no pharmacy Noted  Jardiance 25 mg no fill history Metformin 1000 mg last filled on 08/04/2020 for a 90-Day supply no pharmacy noted Pravastatin 20 mg last filled on 10/03/2020 for a 90-Day supply no pharmacy noted Glimepiride 1 mg No fill history. Medication Fill Gaps: None   Called patient and discussed medication adherence  with patient, no issues at this time with current medication.   Patient denies ED visit since his last CPP follow up.  Patient denies any side effects with his medication. Patient denies any problems with his current pharmacy   Per Clinical Pharmacist, please reschedule patient telephone appointment on 01/06/2021. Patient Verbalized understanding.  Telephone follow up appointment with Care management team member scheduled for : 01/13/2021 at 8:00 am.   [place] Pharmacist Assistant [phone]

## 2020-12-23 MED ORDER — GLIMEPIRIDE 1 MG PO TABS: 1.0000 mg | ORAL_TABLET | Freq: Every day | ORAL | 1 refills | Status: DC

## 2020-12-23 NOTE — Addendum Note (Signed)
Addended by: Wilburt Finlay on: 12/23/2020 04:08 PM   Modules accepted: Orders

## 2020-12-23 NOTE — Telephone Encounter (Signed)
Sent to [facility] on 9/13 by Vernie Murders. Patient requesting to be sent into [facility] mail order pharmacy.

## 2020-12-23 NOTE — Telephone Encounter (Signed)
[facility] faxed refill request for the following medications:  glimepiride (AMARYL) 1 MG tablet    Please advise.

## 2021-01-06 ENCOUNTER — Telehealth: Payer: Medicare Other

## 2021-01-12 ENCOUNTER — Telehealth: Payer: Self-pay

## 2021-01-12 NOTE — Progress Notes (Signed)
    Chronic Care Management Pharmacy Assistant   Name: Patrick Grant  MRN: [id number] DOB: 1955/08/17  Patient called to be reminded of his telephone appointment with [name], CPP on 01/13/2021 @0800 .   No answer, left message of appointment date, time and type of appointment (either telephone or in person). Left message to have all medications, supplements, blood pressure and/or blood sugar logs available during appointment and to return call if need to reschedule.  Star Rating Drug: Glimepiride 1 mg no fill history  Lisinopril 10 mg last filled on 08/03/2020 for a 90-Day supply no pharmacy noted Jardiance 25 mg no fill history  Metformin 1000 mg last filled on 08/04/2020 for a 90-Day supply no pharmacy noted Pravastatin 20 mg lat filled on 10/03/2020 for a 90-Day supply no pharmacy noted  Any gaps in medications fill history? Yes  Care Gaps: Diabetic Eye Exam Zoster Vaccines PNA Vaccine COVID-19 Vaccine Booster 3 Diabetic Foot Exam Influenza Vaccine    [name], CPA/CMA Clinical Pharmacist Assistant Phone: [phone]

## 2021-01-13 ENCOUNTER — Ambulatory Visit (INDEPENDENT_AMBULATORY_CARE_PROVIDER_SITE_OTHER): Payer: Medicare Other

## 2021-01-13 DIAGNOSIS — E1100 Type 2 diabetes mellitus with hyperosmolarity without nonketotic hyperglycemic-hyperosmolar coma (NKHHC): Secondary | ICD-10-CM

## 2021-01-13 DIAGNOSIS — E782 Mixed hyperlipidemia: Secondary | ICD-10-CM

## 2021-01-13 MED ORDER — GLIMEPIRIDE 1 MG PO TABS: 0.5000 mg | ORAL_TABLET | Freq: Every day | ORAL | 1 refills | Status: DC

## 2021-01-13 NOTE — Patient Instructions (Signed)
Visit Information It was great speaking with you today!  Please let me know if you have any questions about our visit.   Goals Addressed             This Visit's Progress    Monitor and Manage My Blood Sugar-Diabetes Type 2   On track    Timeframe:  Long-Range Goal Priority:  High Start Date: 11/11/2020                             Expected End Date: 11/11/2021                       Follow Up within 30 days    - check blood sugar at prescribed times - check blood sugar if I feel it is too high or too low - enter blood sugar readings and medication or insulin into daily log - take the blood sugar log to all doctor visits    Why is this important?   Checking your blood sugar at home helps to keep it from getting very high or very low.  Writing the results in a diary or log helps the doctor know how to care for you.  Your blood sugar log should have the time, date and the results.  Also, write down the amount of insulin or other medicine that you take.  Other information, like what you ate, exercise done and how you were feeling, will also be helpful.     Notes:         Patient Care Plan: General Pharmacy (Adult)     Problem Identified: Hypertension, Hyperlipidemia, Diabetes, and Pulmonary Emphysema   Priority: High     Long-Range Goal: Patient-Specific Goal   Start Date: 11/11/2020  Expected End Date: 11/11/2021  This Visit's Progress: On track  Recent Progress: On track  Priority: High  Note:   Current Barriers:  Unable to independently afford treatment regimen Unable to achieve control of diabetes  Suboptimal therapeutic regimen for cholesterol Unable to achieve control of blood pressure  Pharmacist Clinical Goal(s):  Patient will verbalize ability to afford treatment regimen achieve control of diabetes as evidenced by A1c less than 7% achieve control of blood pressure as evidenced by BP less than 130/80 through collaboration with PharmD and provider.    Interventions: 1:1 collaboration with Chrismon, Vickki Muff, PA-C regarding development and update of comprehensive plan of care as evidenced by provider attestation and co-signature Inter-disciplinary care team collaboration (see longitudinal plan of care) Comprehensive medication review performed; medication list updated in electronic medical record  Hypertension (BP goal <140/90) -Controlled -Current treatment: Lisinopril 10 mg daily  -Medications previously tried: NA  -Current home readings: Not monitoring regularly  -Denies hypotensive/hypertensive symptoms -Counseled to monitor BP at home weekly, document, and provide log at future appointments  Hyperlipidemia: (LDL goal < 70) -Uncontrolled -Current treatment: Pravastatin 20 mg daily  -Medications previously tried: NA  -Given high risk of ASCVD, patient is a candidate for high intensity statin. -Continue current medications for now.   Diabetes (A1c goal <7%) -Uncontrolled -Current medications: Glimepiride 1 mg daily before breakfast  Jardiance 25 mg daily  Metformin 1000 mg twice daily -Medications previously tried: Invokana, glipizide,   -Current home glucose readings fasting glucose: 82, 75, 70s  Before dinner: 135, 140,  -Reports hypoglycemic symptoms: weakness, fatigue -cut back on fruit and fruit juice. Appetite overall lessened due to pain.  -Counseled to always  take glimepiride with food.  -DECREASE Glimepiride to 0.5 mg daily with breakfast due to hypoglycemia.   Patient Goals/Self-Care Activities Patient will:  - check glucose twice daily, before meals, document, and provide at future appointments check blood pressure weekly, document, and provide at future appointments  Follow Up Plan: Telephone follow up appointment with care management team member scheduled for:  02/06/2021 at 8:30 AM    Patient agreed to services and verbal consent obtained.   Patient verbalizes understanding of instructions provided  today and agrees to view in [place].   [name], PharmD, [place], [place] [phone]

## 2021-01-13 NOTE — Progress Notes (Signed)
Chronic Care Management Pharmacy Note  01/13/2021 Name:  KHRIS JANSSON MRN:  [id number] DOB:  18-Jun-1955  Summary: Patient presents for CCM follow-up. He reports blood sugars have been low in the mornings since starting glimepiride.   Recommendations/Changes made from today's visit: -DECREASE Glimepiride to 0.5 mg daily with breakfast due to hypoglycemia.  -Restart PAP for Arlina Robes  Plan: CPP follow-up 1 month   Subjective: QUINTERIUS GAIDA is an 65 y.o. year old male who is a primary patient of Lelon Huh MD.  The CCM team was consulted for assistance with disease management and care coordination needs.    Engaged with patient by telephone for follow up visit in response to provider referral for pharmacy case management and/or care coordination services.   Consent to Services:  The patient was given information about Chronic Care Management services, agreed to services, and gave verbal consent prior to initiation of services.  Please see initial visit note for detailed documentation.   Patient Care Team: Chrismon, Vickki Muff, PA-C (Inactive) as PCP - General (Family Medicine) Allyne Gee, MD as Consulting Physician (Internal Medicine) Pa, [facility]) Jonathon Bellows, MD as Consulting Physician (Gastroenterology)  Recent office visits: 12/01/20: Patient presented to Vernie Murders, PA-C for follow-up. A1c worsened to 8.8%. Glimepiride 1 mg AM started.  03/21/20: Patient presented to Vernie Murders, PA-C for follow-up.   Recent consult visits: None in previous 6 months  Hospital visits: None in previous 6 months   Objective:  Lab Results  Component Value Date   CREATININE 0.97 12/02/2020   BUN 16 12/02/2020   GFRNONAA 67 03/21/2020   GFRAA 78 03/21/2020   NA 135 12/02/2020   K 4.6 12/02/2020   CALCIUM 9.6 12/02/2020   CO2 19 (L) 12/02/2020   GLUCOSE 167 (H) 12/02/2020    Lab Results  Component Value Date/Time   HGBA1C 8.8  (H) 12/02/2020 08:04 AM   HGBA1C 8.4 (H) 03/21/2020 09:52 AM   MICROALBUR negative 01/10/2018 03:02 PM   MICROALBUR 20 01/25/2017 01:55 PM    Last diabetic Eye exam: No results found for: HMDIABEYEEXA  Last diabetic Foot exam: No results found for: HMDIABFOOTEX   Lab Results  Component Value Date   CHOL 171 12/02/2020   HDL 46 12/02/2020   LDLCALC 101 (H) 12/02/2020   TRIG 134 12/02/2020   CHOLHDL 3.7 12/02/2020    Hepatic Function Latest Ref Rng & Units 12/02/2020 03/21/2020 12/15/2019  Total Protein 6.0 - 8.5 g/dL 6.8 7.7 7.2  Albumin 3.8 - 4.8 g/dL 4.9(H) 5.1(H) 5.1(H)  AST 0 - 40 IU/L 11 20 16   ALT 0 - 44 IU/L 20 24 24   Alk Phosphatase 44 - 121 IU/L 102 96 94  Total Bilirubin 0.0 - 1.2 mg/dL 0.5 0.5 0.5    Lab Results  Component Value Date/Time   TSH 1.170 12/02/2020 08:04 AM   TSH 1.420 03/21/2020 09:52 AM    CBC Latest Ref Rng & Units 12/02/2020 03/21/2020 12/15/2019  WBC 3.4 - 10.8 x10E3/uL 8.3 11.0(H) 9.1  Hemoglobin 13.0 - 17.7 g/dL 14.9 16.4 15.8  Hematocrit 37.5 - 51.0 % 44.4 48.5 46.3  Platelets 150 - 450 x10E3/uL 334 377 354    No results found for: VD25OH  Clinical ASCVD: No  The 10-year ASCVD risk score (Arnett DK, et al., 2019) is: 22.2%   Values used to calculate the score:     Age: 29 years     Sex: Male     Is  Non-Hispanic African American: No     Diabetic: Yes     Tobacco smoker: No     Systolic Blood Pressure: 757 mmHg     Is BP treated: Yes     HDL Cholesterol: 46 mg/dL     Total Cholesterol: 171 mg/dL    Depression screen [facility] 2/9 12/01/2020 03/21/2020 02/23/2020  Decreased Interest 0 0 0  Down, Depressed, Hopeless 0 0 1  PHQ - 2 Score 0 0 1  Altered sleeping 1 1 -  Tired, decreased energy 1 0 -  Change in appetite 0 0 -  Feeling bad or failure about yourself  0 0 -  Trouble concentrating 0 0 -  Moving slowly or fidgety/restless 0 0 -  Suicidal thoughts 0 0 -  PHQ-9 Score 2 1 -  Difficult doing work/chores Not difficult at all Not  difficult at all -  Some recent data might be hidden    Social History   Tobacco Use  Smoking Status Former   Types: Cigars   Quit date: 03/12/2016   Years since quitting: 4.8  Smokeless Tobacco Never   BP Readings from Last 3 Encounters:  12/01/20 130/87  03/21/20 (!) 158/93  12/15/19 (!) 126/98   Pulse Readings from Last 3 Encounters:  12/01/20 86  03/21/20 94  12/15/19 93   Wt Readings from Last 3 Encounters:  12/01/20 225 lb (102.1 kg)  03/21/20 225 lb (102.1 kg)  12/15/19 224 lb (101.6 kg)   BMI Readings from Last 3 Encounters:  12/01/20 30.52 kg/m  03/21/20 30.52 kg/m  12/15/19 29.55 kg/m    Assessment/Interventions: Review of patient past medical history, allergies, medications, health status, including review of consultants reports, laboratory and other test data, was performed as part of comprehensive evaluation and provision of chronic care management services.   SDOH:  (Social Determinants of Health) assessments and interventions performed: Yes SDOH Interventions    Flowsheet Row Most Recent Value  SDOH Interventions   Financial Strain Interventions Intervention Not Indicated       SDOH Screenings   Alcohol Screen: Low Risk    Last Alcohol Screening Score (AUDIT): 0  Depression (PHQ2-9): Low Risk    PHQ-2 Score: 2  Financial Resource Strain: Low Risk    Difficulty of Paying Living Expenses: Not hard at all  Food Insecurity: No Food Insecurity   Worried About Charity fundraiser in the Last Year: Never true   Ran Out of Food in the Last Year: Never true  Housing: Low Risk    Last Housing Risk Score: 0  Physical Activity: Inactive   Days of Exercise per Week: 0 days   Minutes of Exercise per Session: 0 min  Social Connections: Moderately Isolated   Frequency of Communication with Friends and Family: Never   Frequency of Social Gatherings with Friends and Family: More than three times a week   Attends Religious Services: Never   Building surveyor or Organizations: No   Attends Archivist Meetings: Never   Marital Status: Living with partner  Stress: Stress Concern Present   Feeling of Stress : To some extent  Tobacco Use: Medium Risk   Smoking Tobacco Use: Former   Smokeless Tobacco Use: Never   Passive Exposure: Not on file  Transportation Needs: No Transportation Needs   Lack of Transportation (Medical): No   Lack of Transportation (Non-Medical): No    CCM Care Plan  Allergies  Allergen Reactions   Almond (Diagnostic) Swelling  throat   Fentanyl Other (See Comments)    Other reaction(s): Jittery   Clove Flavor Other (See Comments)    Loss of vision for 1-2 hrs    Medications Reviewed Today     Reviewed by Margo Common, PA-C (Physician Assistant) on 12/01/20 at [place] Status: <None>   Medication Order Taking? Sig Documenting Provider Last Dose Status Informant  albuterol (VENTOLIN HFA) 108 (90 Base) MCG/ACT inhaler [id number] Yes Inhale 2 puffs into the lungs every 6 (six) hours as needed for wheezing or shortness of breath. Chrismon, Vickki Muff, PA-C Taking Active   empagliflozin (JARDIANCE) 25 MG TABS tablet [id number] Yes Take 1 tablet (25 mg total) by mouth daily. Chrismon, Vickki Muff, PA-C Taking Active   glucose blood test strip [id number] Yes Test blood sugar once daily Jerrol Banana., MD Taking Active   hydrocortisone 2.5 % lotion [id number] Yes Apply topically daily. Tuesday, Thursday, Saturday Ralene Bathe, MD Taking Active   ketoconazole (NIZORAL) 2 % cream [id number] Yes Apply 1 application topically daily. Monday, Wednesday, Friday Ralene Bathe, MD Taking Active   lisinopril (ZESTRIL) 10 MG tablet [id number] Yes TAKE 1 TABLET EVERY DAY Chrismon, Vickki Muff, PA-C Taking Active   metFORMIN (GLUCOPHAGE) 1000 MG tablet [id number] Yes TAKE 1 TABLET TWICE DAILY WITH MEALS Chrismon, Vickki Muff, PA-C Taking Active   mometasone-formoterol (DULERA) 100-5 MCG/ACT AERO  [id number] Yes Inhale 2 puffs into the lungs 2 (two) times daily at 10 AM and 5 PM. Chrismon, Vickki Muff, PA-C Taking Active   mupirocin ointment (BACTROBAN) 2 % [id number] Yes Apply 1 application topically daily. Qd to excision site Ralene Bathe, MD Taking Active   nitroGLYCERIN (NITROSTAT) 0.4 MG SL tablet [id number] Yes Place 1 tablet (0.4 mg total) under the tongue every 5 (five) minutes as needed. Wende Bushy, MD Taking Active            Med Note [place] Royalty   Wed Jun 12, 2017 10:09 AM) Reports has not needed recently   pravastatin (PRAVACHOL) 20 MG tablet [id number] Yes TAKE 1 TABLET EVERY DAY Chrismon, Vickki Muff, PA-C Taking Active   tiotropium (SPIRIVA HANDIHALER) 18 MCG inhalation capsule [id number] Yes Place 1 capsule (18 mcg total) into inhaler and inhale daily. Chrismon, Vickki Muff, PA-C Taking Active             Patient Active Problem List   Diagnosis Date Noted   Dizziness 09/02/2017   Pulmonary emphysema ([place]) 07/05/2017   Scrotal pain, Right 04/19/2016   Prostate cancer screening 04/19/2016   Cervical pain 02/08/2015   Cervical nerve root disorder 02/08/2015   CRPS (complex regional pain syndrome), lower limb 02/08/2015   Bleeding from the nose 02/08/2015   Reflex sympathetic dystrophy of lower extremity 02/08/2015   Type 2 diabetes mellitus ([place]) 02/08/2015   Combined fat and carbohydrate induced hyperlipemia 04/06/2008   Elevated fasting blood sugar 10/10/2007   Essential (primary) hypertension 09/30/2007    Immunization History  Administered Date(s) Administered   Influenza,inj,Quad PF,6+ Mos 01/01/2014, 02/05/2015, 01/31/2016, 01/09/2017, 01/10/2018, 12/15/2019   PFIZER(Purple Top)SARS-COV-2 Vaccination 06/22/2019, 07/21/2019   Pneumococcal Polysaccharide-23 01/09/2017   Tdap 10/10/2020    Conditions to be addressed/monitored:  Hypertension, Hyperlipidemia, Diabetes, and Pulmonary Emphysema   Care Plan : General Pharmacy (Adult)  Updates made by  Germaine Pomfret, [place] since 01/13/2021 12:00 AM     Problem: Hypertension, Hyperlipidemia, Diabetes, and Pulmonary Emphysema   Priority: High     Long-Range Goal: Patient-Specific  Goal   Start Date: 11/11/2020  Expected End Date: 11/11/2021  This Visit's Progress: On track  Recent Progress: On track  Priority: High  Note:   Current Barriers:  Unable to independently afford treatment regimen Unable to achieve control of diabetes  Suboptimal therapeutic regimen for cholesterol Unable to achieve control of blood pressure  Pharmacist Clinical Goal(s):  Patient will verbalize ability to afford treatment regimen achieve control of diabetes as evidenced by A1c less than 7% achieve control of blood pressure as evidenced by BP less than 130/80 through collaboration with PharmD and provider.   Interventions: 1:1 collaboration with Chrismon, Vickki Muff, PA-C regarding development and update of comprehensive plan of care as evidenced by provider attestation and co-signature Inter-disciplinary care team collaboration (see longitudinal plan of care) Comprehensive medication review performed; medication list updated in electronic medical record  Hypertension (BP goal <140/90) -Controlled -Current treatment: Lisinopril 10 mg daily  -Medications previously tried: NA  -Current home readings: Not monitoring regularly  -Denies hypotensive/hypertensive symptoms -Counseled to monitor BP at home weekly, document, and provide log at future appointments  Hyperlipidemia: (LDL goal < 70) -Uncontrolled -Current treatment: Pravastatin 20 mg daily  -Medications previously tried: NA  -Given high risk of ASCVD, patient is a candidate for high intensity statin. -Continue current medications for now.   Diabetes (A1c goal <7%) -Uncontrolled -Current medications: Glimepiride 1 mg daily before breakfast  Jardiance 25 mg daily  Metformin 1000 mg twice daily -Medications previously tried: Invokana,  glipizide,   -Current home glucose readings fasting glucose: 82, 75, 70s  Before dinner: 135, 140,  -Reports hypoglycemic symptoms: weakness, fatigue -cut back on fruit and fruit juice. Appetite overall lessened due to pain.  -Counseled to always take glimepiride with food.  -DECREASE Glimepiride to 0.5 mg daily with breakfast due to hypoglycemia.   Patient Goals/Self-Care Activities Patient will:  - check glucose twice daily, before meals, document, and provide at future appointments check blood pressure weekly, document, and provide at future appointments  Follow Up Plan: Telephone follow up appointment with care management team member scheduled for:  02/06/2021 at 8:30 AM       Medication Assistance:  Arlina Robes obtained through [place] medication assistance program.  Enrollment ends Dec 2022  Compliance/Adherence/Medication fill history: Care Gaps: Ophthalmology Exam Zoster Vaccines PNA (last completed 10/17/20218) COVID-19 Vaccine Booster 3 Foot Exam (last completed 06/22/2019) Hemoglobin A1C (last completed 03/21/2020) Influenza Vaccine (last completed 12/15/2019)  Star-Rating Drugs: Lisinopril 10 mg last filled on 03/09/2020 for a 90-Day supply no pharmacy indicated Jardiance 25 mg no fill history Metformin 1000 mg last filled on 03/17/2020 for a 90-Day supply no pharmacy indicated Pravastatin 20 mg last filled on 03/13/2020 for a 90-Day supply no pharmacy indicated  Patient's preferred pharmacy is:  [facility], [place] [address] Phone: [phone] Fax: [facility] [address] [place] [address] Phone: [phone] Fax: [phone]  [facility] - [address], [place] [address] Phone: [phone] Fax: [phone]  [facility] [place], [place] - [address] [place] [address] Phone: [phone] Fax: [phone]  Uses pill box? Yes Pt endorses 100% compliance  We discussed: Current pharmacy is preferred with insurance plan and patient is satisfied with pharmacy services Patient decided to: Continue current medication  management strategy  Care Plan and Follow Up Patient Decision:  Patient agrees to Care Plan and Follow-up.  Plan: Telephone follow up appointment with care management team member scheduled for:  02/06/2021 at 8:30 AM  [name], PharmD, [place], [place] [phone]

## 2021-01-23 DIAGNOSIS — E782 Mixed hyperlipidemia: Secondary | ICD-10-CM

## 2021-01-23 DIAGNOSIS — E1100 Type 2 diabetes mellitus with hyperosmolarity without nonketotic hyperglycemic-hyperosmolar coma (NKHHC): Secondary | ICD-10-CM

## 2021-01-24 ENCOUNTER — Other Ambulatory Visit: Payer: Self-pay

## 2021-01-24 DIAGNOSIS — L219 Seborrheic dermatitis, unspecified: Secondary | ICD-10-CM

## 2021-01-24 MED ORDER — HYDROCORTISONE 2.5 % EX LOTN: TOPICAL_LOTION | Freq: Every day | CUTANEOUS | 9 refills | Status: DC

## 2021-01-24 MED ORDER — KETOCONAZOLE 2 % EX CREA: 1.0000 "application " | TOPICAL_CREAM | Freq: Every day | CUTANEOUS | 9 refills | Status: DC

## 2021-01-24 NOTE — Progress Notes (Signed)
Pt requested new pharmacy  

## 2021-02-03 ENCOUNTER — Telehealth: Payer: Self-pay

## 2021-02-03 NOTE — Progress Notes (Signed)
APPOINTMENT REMINDER   Called Patrick Grant, No answer, left message of appointment on 02/06/2021 at 8:30 am via telephone visit with [name] , Pharm D. Notified to have all medications, supplements, blood pressure and/or blood sugar logs available during appointment and to return call if need to reschedule.   Care Gaps: Diabetic Eye Exam Zoster Vaccines PNA Vaccine COVID-19 Vaccine Booster 3 Diabetic Foot Exam Influenza Vaccine   Star Rating Drug: Glimepiride 1 mg no fill history  Lisinopril 10 mg last filled on 08/03/2020 for a 90-Day supply no pharmacy noted Jardiance 25 mg no fill history  Metformin 1000 mg last filled on 08/04/2020 for a 90-Day supply no pharmacy noted Pravastatin 20 mg lat filled on 10/03/2020 for a 90-Day supply no pharmacy noted  [place] Pharmacist Assistant [phone]

## 2021-02-06 ENCOUNTER — Ambulatory Visit (INDEPENDENT_AMBULATORY_CARE_PROVIDER_SITE_OTHER): Payer: Medicare Other

## 2021-02-06 DIAGNOSIS — E1100 Type 2 diabetes mellitus with hyperosmolarity without nonketotic hyperglycemic-hyperosmolar coma (NKHHC): Secondary | ICD-10-CM

## 2021-02-06 DIAGNOSIS — I10 Essential (primary) hypertension: Secondary | ICD-10-CM

## 2021-02-06 MED ORDER — GLIMEPIRIDE 1 MG PO TABS: 1.0000 mg | ORAL_TABLET | Freq: Every day | ORAL | 1 refills | Status: DC

## 2021-02-06 NOTE — Progress Notes (Signed)
Chronic Care Management Pharmacy Note  02/06/2021 Name:  Patrick Grant MRN:  [id number] DOB:  1955-05-12  Summary: Patient presents for CCM follow-up. Worsening reflex dystrophy due to the cold. Blood sguars improved with patient's appetite.  Recommendations/Changes made from today's visit: -Resume glimepiride 1 mg daily.  -Restart PAP for Arlina Robes  Plan: CPP follow-up 1 month   Subjective: Patrick Grant is an 65 y.o. year old male who is a primary patient of Lelon Huh MD.  The CCM team was consulted for assistance with disease management and care coordination needs.    Engaged with patient by telephone for follow up visit in response to provider referral for pharmacy case management and/or care coordination services.   Consent to Services:  The patient was given information about Chronic Care Management services, agreed to services, and gave verbal consent prior to initiation of services.  Please see initial visit note for detailed documentation.   Patient Care Team: Chrismon, Vickki Muff, PA-C (Inactive) as PCP - General (Family Medicine) Allyne Gee, MD as Consulting Physician (Internal Medicine) Pa, [facility]) Jonathon Bellows, MD as Consulting Physician (Gastroenterology) Germaine Pomfret, [facility] as Pharmacist (Pharmacist)  Recent office visits: 12/01/20: Patient presented to Vernie Murders, PA-C for follow-up. A1c worsened to 8.8%. Glimepiride 1 mg AM started.  03/21/20: Patient presented to Vernie Murders, PA-C for follow-up.   Recent consult visits: None in previous 6 months  Hospital visits: None in previous 6 months   Objective:  Lab Results  Component Value Date   CREATININE 0.97 12/02/2020   BUN 16 12/02/2020   GFRNONAA 67 03/21/2020   GFRAA 78 03/21/2020   NA 135 12/02/2020   K 4.6 12/02/2020   CALCIUM 9.6 12/02/2020   CO2 19 (L) 12/02/2020   GLUCOSE 167 (H) 12/02/2020    Lab Results  Component Value  Date/Time   HGBA1C 8.8 (H) 12/02/2020 08:04 AM   HGBA1C 8.4 (H) 03/21/2020 09:52 AM   MICROALBUR negative 01/10/2018 03:02 PM   MICROALBUR 20 01/25/2017 01:55 PM    Last diabetic Eye exam: No results found for: HMDIABEYEEXA  Last diabetic Foot exam: No results found for: HMDIABFOOTEX   Lab Results  Component Value Date   CHOL 171 12/02/2020   HDL 46 12/02/2020   LDLCALC 101 (H) 12/02/2020   TRIG 134 12/02/2020   CHOLHDL 3.7 12/02/2020    Hepatic Function Latest Ref Rng & Units 12/02/2020 03/21/2020 12/15/2019  Total Protein 6.0 - 8.5 g/dL 6.8 7.7 7.2  Albumin 3.8 - 4.8 g/dL 4.9(H) 5.1(H) 5.1(H)  AST 0 - 40 IU/L _0 ALT 0 - 44 IU/L _1 Alk Phosphatase 44 - 121 IU/L 102 96 94  Total Bilirubin 0.0 - 1.2 mg/dL 0.5 0.5 0.5    Lab Results  Component Value Date/Time   TSH 1.170 12/02/2020 08:04 AM   TSH 1.420 03/21/2020 09:52 AM    CBC Latest Ref Rng & Units 12/02/2020 03/21/2020 12/15/2019  WBC 3.4 - 10.8 x10E3/uL 8.3 11.0(H) 9.1  Hemoglobin 13.0 - 17.7 g/dL 14.9 16.4 15.8  Hematocrit 37.5 - 51.0 % 44.4 48.5 46.3  Platelets 150 - 450 x10E3/uL 334 377 354    No results found for: VD25OH  Clinical ASCVD: No  The 10-year ASCVD risk score (Arnett DK, et al., 2019) is: 22.2%   Values used to calculate the score:     Age: 53 years     Sex: Male     Is  Non-Hispanic African American: No     Diabetic: Yes     Tobacco smoker: No     Systolic Blood Pressure: 937 mmHg     Is BP treated: Yes     HDL Cholesterol: 46 mg/dL     Total Cholesterol: 171 mg/dL    Depression screen [facility] 2/9 12/01/2020 03/21/2020 02/23/2020  Decreased Interest 0 0 0  Down, Depressed, Hopeless 0 0 1  PHQ - 2 Score 0 0 1  Altered sleeping 1 1 -  Tired, decreased energy 1 0 -  Change in appetite 0 0 -  Feeling bad or failure about yourself  0 0 -  Trouble concentrating 0 0 -  Moving slowly or fidgety/restless 0 0 -  Suicidal thoughts 0 0 -  PHQ-9 Score 2 1 -  Difficult doing work/chores Not  difficult at all Not difficult at all -  Some recent data might be hidden    Social History   Tobacco Use  Smoking Status Former   Types: Cigars   Quit date: 03/12/2016   Years since quitting: 4.9  Smokeless Tobacco Never   BP Readings from Last 3 Encounters:  12/01/20 130/87  03/21/20 (!) 158/93  12/15/19 (!) 126/98   Pulse Readings from Last 3 Encounters:  12/01/20 86  03/21/20 94  12/15/19 93   Wt Readings from Last 3 Encounters:  12/01/20 225 lb (102.1 kg)  03/21/20 225 lb (102.1 kg)  12/15/19 224 lb (101.6 kg)   BMI Readings from Last 3 Encounters:  12/01/20 30.52 kg/m  03/21/20 30.52 kg/m  12/15/19 29.55 kg/m    Assessment/Interventions: Review of patient past medical history, allergies, medications, health status, including review of consultants reports, laboratory and other test data, was performed as part of comprehensive evaluation and provision of chronic care management services.   SDOH:  (Social Determinants of Health) assessments and interventions performed: Yes SDOH Interventions    Flowsheet Row Most Recent Value  SDOH Interventions   Financial Strain Interventions Other (Comment)  [PAP]        SDOH Screenings   Alcohol Screen: Low Risk    Last Alcohol Screening Score (AUDIT): 0  Depression (PHQ2-9): Low Risk    PHQ-2 Score: 2  Financial Resource Strain: Medium Risk   Difficulty of Paying Living Expenses: Somewhat hard  Food Insecurity: No Food Insecurity   Worried About Charity fundraiser in the Last Year: Never true   Ran Out of Food in the Last Year: Never true  Housing: Low Risk    Last Housing Risk Score: 0  Physical Activity: Inactive   Days of Exercise per Week: 0 days   Minutes of Exercise per Session: 0 min  Social Connections: Moderately Isolated   Frequency of Communication with Friends and Family: Never   Frequency of Social Gatherings with Friends and Family: More than three times a week   Attends Religious Services:  Never   Marine scientist or Organizations: No   Attends Archivist Meetings: Never   Marital Status: Living with partner  Stress: Stress Concern Present   Feeling of Stress : To some extent  Tobacco Use: Medium Risk   Smoking Tobacco Use: Former   Smokeless Tobacco Use: Never   Passive Exposure: Not on file  Transportation Needs: No Transportation Needs   Lack of Transportation (Medical): No   Lack of Transportation (Non-Medical): No    CCM Care Plan  Allergies  Allergen Reactions   Almond (Diagnostic) Swelling  throat   Fentanyl Other (See Comments)    Other reaction(s): Jittery   Clove Flavor Other (See Comments)    Loss of vision for 1-2 hrs    Medications Reviewed Today     Reviewed by Margo Common, PA-C (Physician Assistant) on 12/01/20 at [place] List Status: <None>   Medication Order Taking? Sig Documenting Provider Last Dose Status Informant  albuterol (VENTOLIN HFA) 108 (90 Base) MCG/ACT inhaler [id number] Yes Inhale 2 puffs into the lungs every 6 (six) hours as needed for wheezing or shortness of breath. Chrismon, Vickki Muff, PA-C Taking Active   empagliflozin (JARDIANCE) 25 MG TABS tablet [id number] Yes Take 1 tablet (25 mg total) by mouth daily. Chrismon, Vickki Muff, PA-C Taking Active   glucose blood test strip [id number] Yes Test blood sugar once daily Jerrol Banana., MD Taking Active   hydrocortisone 2.5 % lotion [id number] Yes Apply topically daily. Tuesday, Thursday, Saturday Ralene Bathe, MD Taking Active   ketoconazole (NIZORAL) 2 % cream [id number] Yes Apply 1 application topically daily. Monday, Wednesday, Friday Ralene Bathe, MD Taking Active   lisinopril (ZESTRIL) 10 MG tablet [id number] Yes TAKE 1 TABLET EVERY DAY Chrismon, Vickki Muff, PA-C Taking Active   metFORMIN (GLUCOPHAGE) 1000 MG tablet [id number] Yes TAKE 1 TABLET TWICE DAILY WITH MEALS Chrismon, Vickki Muff, PA-C Taking Active   mometasone-formoterol (DULERA) 100-5  MCG/ACT AERO [id number] Yes Inhale 2 puffs into the lungs 2 (two) times daily at 10 AM and 5 PM. Chrismon, Vickki Muff, PA-C Taking Active   mupirocin ointment (BACTROBAN) 2 % [id number] Yes Apply 1 application topically daily. Qd to excision site Ralene Bathe, MD Taking Active   nitroGLYCERIN (NITROSTAT) 0.4 MG SL tablet [id number] Yes Place 1 tablet (0.4 mg total) under the tongue every 5 (five) minutes as needed. Wende Bushy, MD Taking Active            Med Note [facility]   Wed Jun 12, 2017 10:09 AM) Reports has not needed recently   pravastatin (PRAVACHOL) 20 MG tablet [id number] Yes TAKE 1 TABLET EVERY DAY Chrismon, Vickki Muff, PA-C Taking Active   tiotropium (SPIRIVA HANDIHALER) 18 MCG inhalation capsule [id number] Yes Place 1 capsule (18 mcg total) into inhaler and inhale daily. Chrismon, Vickki Muff, PA-C Taking Active             Patient Active Problem List   Diagnosis Date Noted   Dizziness 09/02/2017   Pulmonary emphysema ([place]) 07/05/2017   Scrotal pain, Right 04/19/2016   Prostate cancer screening 04/19/2016   Cervical pain 02/08/2015   Cervical nerve root disorder 02/08/2015   CRPS (complex regional pain syndrome), lower limb 02/08/2015   Bleeding from the nose 02/08/2015   Reflex sympathetic dystrophy of lower extremity 02/08/2015   Type 2 diabetes mellitus ([place]) 02/08/2015   Combined fat and carbohydrate induced hyperlipemia 04/06/2008   Elevated fasting blood sugar 10/10/2007   Essential (primary) hypertension 09/30/2007    Immunization History  Administered Date(s) Administered   Influenza,inj,Quad PF,6+ Mos 01/01/2014, 02/05/2015, 01/31/2016, 01/09/2017, 01/10/2018, 12/15/2019   PFIZER(Purple Top)SARS-COV-2 Vaccination 06/22/2019, 07/21/2019   Pneumococcal Polysaccharide-23 01/09/2017   Tdap 10/10/2020    Conditions to be addressed/monitored:  Hypertension, Hyperlipidemia, Diabetes, and Pulmonary Emphysema   Care Plan : General Pharmacy (Adult)  Updates  made by Germaine Pomfret, [place] since 02/06/2021 12:00 AM     Problem: Hypertension, Hyperlipidemia, Diabetes, and Pulmonary Emphysema   Priority: High     Long-Range Goal: Patient-Specific  Goal   Start Date: 11/11/2020  Expected End Date: 11/11/2021  Recent Progress: On track  Priority: High  Note:   Current Barriers:  Unable to independently afford treatment regimen Unable to achieve control of diabetes  Suboptimal therapeutic regimen for cholesterol Unable to achieve control of blood pressure  Pharmacist Clinical Goal(s):  Patient will verbalize ability to afford treatment regimen achieve control of diabetes as evidenced by A1c less than 7% achieve control of blood pressure as evidenced by BP less than 130/80 through collaboration with PharmD and provider.   Interventions: 1:1 collaboration with Lelon Huh, MD regarding development and update of comprehensive plan of care as evidenced by provider attestation and co-signature Inter-disciplinary care team collaboration (see longitudinal plan of care) Comprehensive medication review performed; medication list updated in electronic medical record  Hypertension (BP goal <130/80) -Controlled -Current treatment: Lisinopril 10 mg daily  -Medications previously tried: NA  -Current home readings: Not monitoring regularly  -Denies hypotensive/hypertensive symptoms -Counseled to monitor BP at home weekly, document, and provide log at future appointments  Hyperlipidemia: (LDL goal < 70) -Uncontrolled -Current treatment: Pravastatin 20 mg daily  -Medications previously tried: NA  -Given high risk of ASCVD, patient is a candidate for high intensity statin. -Continue current medications for now.   Diabetes (A1c goal <7%) -Uncontrolled -Current medications: Glimepiride 1 mg daily before breakfast  Jardiance 25 mg daily  Metformin 1000 mg twice daily -Medications previously tried: Invokana, glipizide,   -Current home glucose  readings  Fasting  14-Nov 108  13-Nov 110  12-Nov 114  11-Nov 111  10-Nov 112  Average 111  -Denies hypoglycemic symptoms -Appetite improved overall. Patient never decreased glimepiride as instructed, but hypoglycemia improved with appetite.  -INCREASE Glimepiride back to 1 mg daily.  Patient Goals/Self-Care Activities Patient will:  - check glucose daily, before meals, document, and provide at future appointments check blood pressure weekly, document, and provide at future appointments  Follow Up Plan: Telephone follow up appointment with care management team member scheduled for:  05/08/2021 at 8:30 AM        Medication Assistance:  Arlina Robes obtained through [place] medication assistance program.  Enrollment ends Dec 2022  Compliance/Adherence/Medication fill history: Care Gaps: Ophthalmology Exam Zoster Vaccines PNA (last completed 10/17/20218) COVID-19 Vaccine Booster 3 Foot Exam (last completed 06/22/2019) Hemoglobin A1C (last completed 03/21/2020) Influenza Vaccine (last completed 12/15/2019)  Star-Rating Drugs: Lisinopril 10 mg last filled on 03/09/2020 for a 90-Day supply no pharmacy indicated Jardiance 25 mg no fill history Metformin 1000 mg last filled on 03/17/2020 for a 90-Day supply no pharmacy indicated Pravastatin 20 mg last filled on 03/13/2020 for a 90-Day supply no pharmacy indicated  Patient's preferred pharmacy is:  [facility], [place] [address] Phone: [phone] Fax: [facility] [address] [place] [address] Phone: [phone] Fax: [phone]  [facility] - [address] [place] [address] Phone: [phone] Fax: [phone]  [facility] [place], [place] - [address] [place] [address] Phone: [phone] Fax: [phone]  Uses pill box? Yes Pt endorses 100% compliance  We discussed: Current pharmacy is preferred with insurance plan and patient is satisfied with pharmacy services Patient decided to: Continue current medication management strategy  Care Plan and Follow Up Patient Decision:  Patient agrees to Care Plan and Follow-up.  Plan: Telephone follow up appointment with care management team member scheduled for:  05/08/2021 at 8:30 AM  [name], PharmD, Para [place], [place] [phone]

## 2021-02-06 NOTE — Patient Instructions (Signed)
Visit Information It was great speaking with you today!  Please let me know if you have any questions about our visit.   Goals Addressed             This Visit's Progress    Monitor and Manage My Blood Sugar-Diabetes Type 2   On track    Timeframe:  Long-Range Goal Priority:  High Start Date: 11/11/2020                             Expected End Date: 11/11/2021                       Follow Up within 90 days    - check blood sugar at prescribed times - check blood sugar if I feel it is too high or too low - enter blood sugar readings and medication or insulin into daily log - take the blood sugar log to all doctor visits    Why is this important?   Checking your blood sugar at home helps to keep it from getting very high or very low.  Writing the results in a diary or log helps the doctor know how to care for you.  Your blood sugar log should have the time, date and the results.  Also, write down the amount of insulin or other medicine that you take.  Other information, like what you ate, exercise done and how you were feeling, will also be helpful.     Notes:         Patient Care Plan: General Pharmacy (Adult)     Problem Identified: Hypertension, Hyperlipidemia, Diabetes, and Pulmonary Emphysema   Priority: High     Long-Range Goal: Patient-Specific Goal   Start Date: 11/11/2020  Expected End Date: 11/11/2021  Recent Progress: On track  Priority: High  Note:   Current Barriers:  Unable to independently afford treatment regimen Unable to achieve control of diabetes  Suboptimal therapeutic regimen for cholesterol Unable to achieve control of blood pressure  Pharmacist Clinical Goal(s):  Patient will verbalize ability to afford treatment regimen achieve control of diabetes as evidenced by A1c less than 7% achieve control of blood pressure as evidenced by BP less than 130/80 through collaboration with PharmD and provider.   Interventions: 1:1 collaboration with  Lelon Huh, MD regarding development and update of comprehensive plan of care as evidenced by provider attestation and co-signature Inter-disciplinary care team collaboration (see longitudinal plan of care) Comprehensive medication review performed; medication list updated in electronic medical record  Hypertension (BP goal <130/80) -Controlled -Current treatment: Lisinopril 10 mg daily  -Medications previously tried: NA  -Current home readings: Not monitoring regularly  -Denies hypotensive/hypertensive symptoms -Counseled to monitor BP at home weekly, document, and provide log at future appointments  Hyperlipidemia: (LDL goal < 70) -Uncontrolled -Current treatment: Pravastatin 20 mg daily  -Medications previously tried: NA  -Given high risk of ASCVD, patient is a candidate for high intensity statin. -Continue current medications for now.   Diabetes (A1c goal <7%) -Uncontrolled -Current medications: Glimepiride 1 mg daily before breakfast  Jardiance 25 mg daily  Metformin 1000 mg twice daily -Medications previously tried: Invokana, glipizide,   -Current home glucose readings  Fasting  14-Nov 108  13-Nov 110  12-Nov 114  11-Nov 111  10-Nov 112  Average 111  -Denies hypoglycemic symptoms -Appetite improved overall. Patient never decreased glimepiride as instructed, but hypoglycemia improved with appetite.  -INCREASE  Glimepiride back to 1 mg daily.  Patient Goals/Self-Care Activities Patient will:  - check glucose daily, before meals, document, and provide at future appointments check blood pressure weekly, document, and provide at future appointments  Follow Up Plan: Telephone follow up appointment with care management team member scheduled for:  05/08/2021 at 8:30 AM    Patient agreed to services and verbal consent obtained.   Patient verbalizes understanding of instructions provided today and agrees to view in [place].   [name], PharmD, Para [place], CPP   Clinical Pharmacist Practitioner  [facility] [phone]

## 2021-02-08 ENCOUNTER — Other Ambulatory Visit: Payer: Self-pay | Admitting: Family Medicine

## 2021-02-08 DIAGNOSIS — E1100 Type 2 diabetes mellitus with hyperosmolarity without nonketotic hyperglycemic-hyperosmolar coma (NKHHC): Secondary | ICD-10-CM

## 2021-02-21 ENCOUNTER — Telehealth: Payer: Self-pay | Admitting: Family Medicine

## 2021-02-21 MED ORDER — PRAVASTATIN SODIUM 20 MG PO TABS: 20.0000 mg | ORAL_TABLET | Freq: Every day | ORAL | 1 refills | Status: DC

## 2021-02-21 NOTE — Telephone Encounter (Signed)
[facility] faxed refill request for the following medications:  pravastatin (PRAVACHOL) 20 MG tablet   Please advise.

## 2021-02-22 DIAGNOSIS — I10 Essential (primary) hypertension: Secondary | ICD-10-CM

## 2021-02-22 DIAGNOSIS — E1100 Type 2 diabetes mellitus with hyperosmolarity without nonketotic hyperglycemic-hyperosmolar coma (NKHHC): Secondary | ICD-10-CM

## 2021-02-22 DIAGNOSIS — Z7984 Long term (current) use of oral hypoglycemic drugs: Secondary | ICD-10-CM

## 2021-02-22 DIAGNOSIS — Z87891 Personal history of nicotine dependence: Secondary | ICD-10-CM

## 2021-02-24 DIAGNOSIS — Z23 Encounter for immunization: Secondary | ICD-10-CM | POA: Diagnosis not present

## 2021-03-03 ENCOUNTER — Telehealth: Payer: Self-pay

## 2021-03-03 NOTE — Progress Notes (Addendum)
    Chronic Care Management Pharmacy Assistant   Name: Patrick Grant  MRN: [id number] DOB: Jul 26, 1955  Patient Assistant 5643 Renewal Application  I received a task from [name], CPP stating that the patient had already started the application on his on for his 2023 Renewal for his Spiriva and his Jardiance through [name]. Alex stated that he needs me to complete the provider portion of the 3295 application for these medications and send them to him so he can fax them over to [name] along with the patient's portion of the application.  I completed the providers portion of the patient application, and both forms have been e-mailed to CPP for providers signature. Once CPP has received providers signature he will fax application along with signed prescriptions over to [facility] at [phone]. Once CPP faxes over application I have requested he send me a task so I can follow-up on the status of the patient's application.  Medications: Outpatient Encounter Medications as of 03/03/2021  Medication Sig Note   albuterol (VENTOLIN HFA) 108 (90 Base) MCG/ACT inhaler Inhale 2 puffs into the lungs every 6 (six) hours as needed for wheezing or shortness of breath.    empagliflozin (JARDIANCE) 25 MG TABS tablet Take 1 tablet (25 mg total) by mouth daily.    glimepiride (AMARYL) 1 MG tablet Take 1 tablet (1 mg total) by mouth daily with breakfast.    hydrocortisone 2.5 % lotion Apply topically daily. Tuesday, Thursday, Saturday    ketoconazole (NIZORAL) 2 % cream Apply 1 application topically daily. Monday, Wednesday, Friday    lisinopril (ZESTRIL) 10 MG tablet TAKE 1 TABLET EVERY DAY    metFORMIN (GLUCOPHAGE) 1000 MG tablet TAKE 1 TABLET TWICE DAILY WITH MEALS    mometasone-formoterol (DULERA) 100-5 MCG/ACT AERO Inhale 2 puffs into the lungs 2 (two) times daily at 10 AM and 5 PM.    mupirocin ointment (BACTROBAN) 2 % Apply 1 application topically daily. Qd to excision site    nitroGLYCERIN  (NITROSTAT) 0.4 MG SL tablet Place 1 tablet (0.4 mg total) under the tongue every 5 (five) minutes as needed. 06/12/2017: Reports has not needed recently    ONETOUCH ULTRA test strip USE 1 STRIP TO CHECK GLUCOSE ONCE DAILY    pravastatin (PRAVACHOL) 20 MG tablet Take 1 tablet (20 mg total) by mouth daily.    tiotropium (SPIRIVA HANDIHALER) 18 MCG inhalation capsule Place 1 capsule (18 mcg total) into inhaler and inhale daily.    No facility-administered encounter medications on file as of 03/03/2021.    [name], CPA/CMA Clinical Pharmacist Assistant Phone: [phone]   Addendum 03/08/21: [name] Patient Assistance form for Jardiance completed by patient and faxed for review on 03/08/21

## 2021-03-07 ENCOUNTER — Ambulatory Visit: Payer: Medicare Other | Admitting: Family Medicine

## 2021-03-08 ENCOUNTER — Telehealth: Payer: Self-pay

## 2021-03-08 NOTE — Progress Notes (Signed)
° ° °  Chronic Care Management Pharmacy Assistant   Name: Patrick Grant  MRN: [id number] DOB: [birth year]  Pharmacy Cost Comparison  Patient called, and is requesting to have a cost comparison done between [facility]. Patient stated he is interested in switching to [facility] if it's the same price or doesn't cost him more than what he is paying now.  After doing the cost comparison with [facility] and patient's [facility] it appears that his pharmacy is preferred and his medications cost him zero dollars vs with [facility] he would have to pay some money out-of-pocket.  Spoke with the patient, and informed him that [facility] is the preferred pharmacy for him, and he cheaper pharmacy. Patient did request refills on his Glimepiride, Pravastatin, Lisinopril, and Metformin. Request sent to CPP.  Medications: Outpatient Encounter Medications as of 03/08/2021  Medication Sig Note   albuterol (VENTOLIN HFA) 108 (90 Base) MCG/ACT inhaler Inhale 2 puffs into the lungs every 6 (six) hours as needed for wheezing or shortness of breath.    empagliflozin (JARDIANCE) 25 MG TABS tablet Take 1 tablet (25 mg total) by mouth daily.    glimepiride (AMARYL) 1 MG tablet Take 1 tablet (1 mg total) by mouth daily with breakfast.    hydrocortisone 2.5 % lotion Apply topically daily. Tuesday, Thursday, Saturday    ketoconazole (NIZORAL) 2 % cream Apply 1 application topically daily. Monday, Wednesday, Friday    lisinopril (ZESTRIL) 10 MG tablet TAKE 1 TABLET EVERY DAY    metFORMIN (GLUCOPHAGE) 1000 MG tablet TAKE 1 TABLET TWICE DAILY WITH MEALS    mometasone-formoterol (DULERA) 100-5 MCG/ACT AERO Inhale 2 puffs into the lungs 2 (two) times daily at 10 AM and 5 PM.    mupirocin ointment (BACTROBAN) 2 % Apply 1 application topically daily. Qd to excision site    nitroGLYCERIN (NITROSTAT) 0.4 MG SL tablet Place 1 tablet (0.4 mg total) under the tongue every 5 (five)  minutes as needed. 06/12/2017: Reports has not needed recently    ONETOUCH ULTRA test strip USE 1 STRIP TO CHECK GLUCOSE ONCE DAILY    pravastatin (PRAVACHOL) 20 MG tablet Take 1 tablet (20 mg total) by mouth daily.    tiotropium (SPIRIVA HANDIHALER) 18 MCG inhalation capsule Place 1 capsule (18 mcg total) into inhaler and inhale daily.    No facility-administered encounter medications on file as of 03/08/2021.   [name], CPA/CMA Clinical Pharmacist Assistant Phone: [phone]

## 2021-03-13 ENCOUNTER — Telehealth: Payer: Self-pay | Admitting: Family Medicine

## 2021-03-13 DIAGNOSIS — E1100 Type 2 diabetes mellitus with hyperosmolarity without nonketotic hyperglycemic-hyperosmolar coma (NKHHC): Secondary | ICD-10-CM

## 2021-03-13 MED ORDER — LISINOPRIL 10 MG PO TABS: 10.0000 mg | ORAL_TABLET | Freq: Every day | ORAL | 1 refills | Status: DC

## 2021-03-13 MED ORDER — PRAVASTATIN SODIUM 20 MG PO TABS: 20.0000 mg | ORAL_TABLET | Freq: Every day | ORAL | 1 refills | Status: DC

## 2021-03-13 MED ORDER — METFORMIN HCL 1000 MG PO TABS: ORAL_TABLET | ORAL | 1 refills | Status: DC

## 2021-03-13 NOTE — Telephone Encounter (Signed)
[facility] faxed refill request for the following medications:  metFORMIN (GLUCOPHAGE) 1000 MG tablet   pravastatin (PRAVACHOL) 20 MG tablet   lisinopril (ZESTRIL) 10 MG tablet   Please advise.

## 2021-03-16 ENCOUNTER — Telehealth: Payer: Self-pay

## 2021-03-16 NOTE — Progress Notes (Signed)
° ° °  Chronic Care Management Pharmacy Assistant   Name: Patrick Grant  MRN: [id number] DOB: May 27, 1955  Patient Assistant Application for [id number]  Patient is currently receiving patient assist through [facility] for his Jardiance and Spiriva. Patient turned in his 8887 renewal application for these medication, and they were faxed over to [facility] cares for processing on 12/14.  I contacted Stephen today @ [phone] to check the status of the patient's application, and to make sure that there is nothing else needed in order for [employer] to process the patient's application.   I spoke with the representative who confirmed that they did receive the patient's application, but at this time the application has not been processed. She stated that once the application has been processed they would send a letter to the patient regarding the status of his application. She stated that they would also fax a letter of status to the providers office. Per representative due to it being the end of the year, and them having so many applications to process it could be another few weeks before they get this patient's application.  I called patient to update him on the status of his application, but I had to leave a voice mail. I informed patient on his voicemail that he should receive a letter in the mail next month, and if he does not to give me a call back directly at [phone].  Medications: Outpatient Encounter Medications as of 03/16/2021  Medication Sig Note   albuterol (VENTOLIN HFA) 108 (90 Base) MCG/ACT inhaler Inhale 2 puffs into the lungs every 6 (six) hours as needed for wheezing or shortness of breath.    empagliflozin (JARDIANCE) 25 MG TABS tablet Take 1 tablet (25 mg total) by mouth daily.    glimepiride (AMARYL) 1 MG tablet Take 1 tablet (1 mg total) by mouth daily with breakfast.    hydrocortisone 2.5 % lotion Apply topically daily. Tuesday, Thursday, Saturday    ketoconazole (NIZORAL) 2 % cream  Apply 1 application topically daily. Monday, Wednesday, Friday    lisinopril (ZESTRIL) 10 MG tablet Take 1 tablet (10 mg total) by mouth daily.    metFORMIN (GLUCOPHAGE) 1000 MG tablet TAKE 1 TABLET TWICE DAILY WITH MEALS    mometasone-formoterol (DULERA) 100-5 MCG/ACT AERO Inhale 2 puffs into the lungs 2 (two) times daily at 10 AM and 5 PM.    mupirocin ointment (BACTROBAN) 2 % Apply 1 application topically daily. Qd to excision site    nitroGLYCERIN (NITROSTAT) 0.4 MG SL tablet Place 1 tablet (0.4 mg total) under the tongue every 5 (five) minutes as needed. 06/12/2017: Reports has not needed recently    ONETOUCH ULTRA test strip USE 1 STRIP TO CHECK GLUCOSE ONCE DAILY    pravastatin (PRAVACHOL) 20 MG tablet Take 1 tablet (20 mg total) by mouth daily.    tiotropium (SPIRIVA HANDIHALER) 18 MCG inhalation capsule Place 1 capsule (18 mcg total) into inhaler and inhale daily.    No facility-administered encounter medications on file as of 03/16/2021.    [name], CPA/CMA Clinical Pharmacist Assistant Phone: [phone]

## 2021-03-31 ENCOUNTER — Telehealth: Payer: Self-pay

## 2021-03-31 NOTE — Progress Notes (Signed)
Chronic Care Management Pharmacy Assistant   Name: Patrick Grant  MRN: [id number] DOB: 05-Aug-1955  Reason for Encounter: Diabetes Disease State Call   Recent office visits:  None ID  Recent consult visits:  None ID  Hospital visits:  None in previous 6 months  Medications: Outpatient Encounter Medications as of 03/31/2021  Medication Sig Note   albuterol (VENTOLIN HFA) 108 (90 Base) MCG/ACT inhaler Inhale 2 puffs into the lungs every 6 (six) hours as needed for wheezing or shortness of breath.    empagliflozin (JARDIANCE) 25 MG TABS tablet Take 1 tablet (25 mg total) by mouth daily.    glimepiride (AMARYL) 1 MG tablet Take 1 tablet (1 mg total) by mouth daily with breakfast.    hydrocortisone 2.5 % lotion Apply topically daily. Tuesday, Thursday, Saturday    ketoconazole (NIZORAL) 2 % cream Apply 1 application topically daily. Monday, Wednesday, Friday    lisinopril (ZESTRIL) 10 MG tablet Take 1 tablet (10 mg total) by mouth daily.    metFORMIN (GLUCOPHAGE) 1000 MG tablet TAKE 1 TABLET TWICE DAILY WITH MEALS    mometasone-formoterol (DULERA) 100-5 MCG/ACT AERO Inhale 2 puffs into the lungs 2 (two) times daily at 10 AM and 5 PM.    mupirocin ointment (BACTROBAN) 2 % Apply 1 application topically daily. Qd to excision site    nitroGLYCERIN (NITROSTAT) 0.4 MG SL tablet Place 1 tablet (0.4 mg total) under the tongue every 5 (five) minutes as needed. 06/12/2017: Reports has not needed recently    ONETOUCH ULTRA test strip USE 1 STRIP TO CHECK GLUCOSE ONCE DAILY    pravastatin (PRAVACHOL) 20 MG tablet Take 1 tablet (20 mg total) by mouth daily.    tiotropium (SPIRIVA HANDIHALER) 18 MCG inhalation capsule Place 1 capsule (18 mcg total) into inhaler and inhale daily.    No facility-administered encounter medications on file as of 03/31/2021.   Care Gaps: Diabetic Eye Exam Zoster Vaccines PNA Vaccines COVID-19 Vaccine Booster 3 Diabetic Foot Exam Influenza Vaccine  Star Rating  Drugs: Lisinopril 10 mg last filled on 03/13/2021 for a 90-Day supply with [place] Metformin 1000 mg last filled on 03/13/2021 for a 90-Day supply with [place] Pravastatin 20 mg last filled on 03/13/2021 for a 90-Day supply with [place] Glimepiride 1 mg last filled on 03/04/2021 for a 90-Day supply with [place] 25 mg patient is on patient assistance through [employer] 8466 Renewal Application submitted  Recent Relevant Labs: Lab Results  Component Value Date/Time   HGBA1C 8.8 (H) 12/02/2020 08:04 AM   HGBA1C 8.4 (H) 03/21/2020 09:52 AM   MICROALBUR negative 01/10/2018 03:02 PM   MICROALBUR 20 01/25/2017 01:55 PM    Kidney Function Lab Results  Component Value Date/Time   CREATININE 0.97 12/02/2020 08:04 AM   CREATININE 1.16 03/21/2020 09:52 AM   CREATININE 0.92 02/06/2017 09:40 AM   GFRNONAA 67 03/21/2020 09:52 AM   GFRNONAA 91 02/06/2017 09:40 AM   GFRAA 78 03/21/2020 09:52 AM   GFRAA 105 02/06/2017 09:40 AM    Current antihyperglycemic regimen:  Glimepiride 1 mg daily before breakfast  Jardiance 25 mg daily  Metformin 1000 mg twice daily  What recent interventions/DTPs have been made to improve glycemic control:  None ID  Have there been any recent hospitalizations or ED visits since last visit with CPP? No  Patient denies hypoglycemic symptoms, including Pale, Sweaty, Shaky, Hungry, Nervous/irritable, and Vision changes  Patient denies hyperglycemic symptoms, including blurry vision, excessive thirst, fatigue, polyuria,  and weakness  How often are you checking your blood sugar? once daily  What are your blood sugars ranging?  Fasting: 130  During the week, how often does your blood glucose drop below 70? Never  Are you checking your feet daily/regularly?   Adherence Review: Is the patient currently on a STATIN medication? Yes Is the patient currently on ACE/ARB medication? Yes Does the patient have >5 day gap between last  estimated fill dates? No  04/04/2021 LVM requesting patient to return my call  Patient reports that he is doing well. He has had several deaths in his family recently so he is going through a tough time right now. Patient has lost a close cousin, a sister and 2 aunts. Outside of grieving patient denies any ill symptoms, and advises he has been feeling well. He stated that his blood sugar numbers have been good, and he has been watching what he eats to keep things at a normal level. Patient did advise he has not received any notification regarding his Jardiance, and he only has 11 days left. I informed him that I would contact [place] for him for status, and give him a call back regarding an update. Patient also advised he also has new insurance. He is now covered with [facility] Member ID [id number] Rx BIN [id number] Rx PCN [id number] Rx GRP [id number].   04/05/2021  I spoke with a representative from [facility] regarding the patient's application and the representative advised they never received proof of the patient's income. I sent a task to the CPP inquiring about this and requesting he refaxes the proof if he has it immediately since the patient only has 11 pills left.  CPP advised that he did not have a copy of the patient's income, and that the patient would need to bring a copy to the office.  I contacted the patient, and informed that we would need proof of income. I advised he could bring a copy of the letter social security sends him or if he files taxes he can bring that to the office and CPP would fax it over fax for him. Patient stated that he does not drive as he would have to have someone bring him to the office so it may take him a day or two. I informed him that would be perfectly okay.  Patient has a scheduled telephone appointment with [name], CPP 05/08/2021 @0830   [name], CPA/CMA Clinical Pharmacist Assistant Phone: [phone]

## 2021-04-01 ENCOUNTER — Other Ambulatory Visit: Payer: Self-pay | Admitting: Family Medicine

## 2021-04-01 DIAGNOSIS — E1100 Type 2 diabetes mellitus with hyperosmolarity without nonketotic hyperglycemic-hyperosmolar coma (NKHHC): Secondary | ICD-10-CM

## 2021-04-01 NOTE — Telephone Encounter (Signed)
Pharmacy comment: either do prior authorization or send in for accu chek meter and supplies

## 2021-04-19 ENCOUNTER — Telehealth: Payer: Self-pay

## 2021-04-19 NOTE — Telephone Encounter (Signed)
Does anyone know the status?

## 2021-04-19 NOTE — Telephone Encounter (Signed)
Copied from [place] [phone]. Topic: General - Other >> Apr 19, 2021  4:23 PM Erick Blinks wrote: Reason for CRM: Pt called to report that he brought a handicap placard form to the office a few weeks ago, would like to know status   Best contact: [phone]

## 2021-04-20 NOTE — Telephone Encounter (Signed)
Called and made appt with patient for 05/09/21 with Ria Comment. Patient disturbed that he has to come in for a DM check for him to get his disability parking placard.

## 2021-04-20 NOTE — Telephone Encounter (Signed)
For some reason it seems like I may have seen something stating that he needed an appointment and that someone had left a message stating that but I am not positive that this is the same patient.  Maybe the provider or nurse that stated that could help?

## 2021-04-28 ENCOUNTER — Telehealth: Payer: Self-pay

## 2021-04-28 NOTE — Progress Notes (Signed)
° ° °  Chronic Care Management Pharmacy Assistant   Name: Patrick Grant  MRN: [id number] DOB: March 22, 1956  Patient Assistance Renewal Application for Jardiance  The patient was contacted a few weeks ago informing him that [place] was missing his proof of income. Patient went to the clinic to provide proof of income so CPP could fax it over to [facility].  I am reaching out to [facility] today to check to see if they received the fax, and check the status of the patient's application. I attempted to contact [place], but there is a significant wait time so I will call them back on Monday morning.    05/01/2021  Called [employer] to check the status of the patient's application for his Jardiance. I spoke with a representative who confirmed that patient was approved for the 2023 year starting 04/14/2021 until 03/25/2022. The patient's first shipment has already been shipped out and expected to be delivered on 05/03/2021. Tracking number for this delivery is [id number].    I contacted the patient to update him, but I had to leave a HIPAA compliant message. I did update the patient, and provided him with my direct number so he can call me back with any questions he has. I also LVM requesting patient to return call in order to reschedule him with a telephone follow-up with CPP.   Medications: Outpatient Encounter Medications as of 04/28/2021  Medication Sig Note   albuterol (VENTOLIN HFA) 108 (90 Base) MCG/ACT inhaler Inhale 2 puffs into the lungs every 6 (six) hours as needed for wheezing or shortness of breath.    empagliflozin (JARDIANCE) 25 MG TABS tablet Take 1 tablet (25 mg total) by mouth daily.    glimepiride (AMARYL) 1 MG tablet Take 1 tablet (1 mg total) by mouth daily with breakfast.    hydrocortisone 2.5 % lotion Apply topically daily. Tuesday, Thursday, Saturday    ketoconazole (NIZORAL) 2 % cream Apply 1 application topically daily. Monday, Wednesday, Friday    lisinopril (ZESTRIL)  10 MG tablet Take 1 tablet (10 mg total) by mouth daily.    metFORMIN (GLUCOPHAGE) 1000 MG tablet TAKE 1 TABLET TWICE DAILY WITH MEALS    mometasone-formoterol (DULERA) 100-5 MCG/ACT AERO Inhale 2 puffs into the lungs 2 (two) times daily at 10 AM and 5 PM.    mupirocin ointment (BACTROBAN) 2 % Apply 1 application topically daily. Qd to excision site    nitroGLYCERIN (NITROSTAT) 0.4 MG SL tablet Place 1 tablet (0.4 mg total) under the tongue every 5 (five) minutes as needed. 06/12/2017: Reports has not needed recently    ONETOUCH ULTRA test strip USE 1 STRIP TO CHECK GLUCOSE ONCE DAILY    pravastatin (PRAVACHOL) 20 MG tablet Take 1 tablet (20 mg total) by mouth daily.    tiotropium (SPIRIVA HANDIHALER) 18 MCG inhalation capsule Place 1 capsule (18 mcg total) into inhaler and inhale daily.    No facility-administered encounter medications on file as of 04/28/2021.    [name], CPA/CMA Clinical Pharmacist Assistant Phone: [phone]

## 2021-05-08 ENCOUNTER — Telehealth: Payer: Medicare Other

## 2021-05-08 NOTE — Progress Notes (Signed)
I,Patrick Grant,acting as a Education administrator for [employer], PA-C.,have documented all relevant documentation on the behalf of Patrick Kirschner, PA-C,as directed by  Patrick Kirschner, PA-C while in the presence of Patrick Kirschner, PA-C.  Established patient visit   Patient: Patrick Grant   DOB: 06-05-1955   66 y.o. Male  MRN: [id number] Visit Date: 05/09/2021  Today's healthcare provider: [name], PA-C   Cc. Handicap placard, dmII, htn  Subjective    HPI  Patrick Grant is a 66 y/o male who presents today for completion of his handicap placard.   Reports continued chronic, severe pain with left sided foot/leg reflex synthetic dystrophy. Reports history of pain management, neurology, with many medications including opioids and gabapentin, none which were successful. Currently uses crutches and a power wheelchair as methods to get around. Reports his dog chewed up the hand padding on one of his crutches.  Diabetes Mellitus Type II, follow-up  Lab Results  Component Value Date   HGBA1C 8.8 (H) 12/02/2020   HGBA1C 8.4 (H) 03/21/2020   HGBA1C 9.1 (H) 12/15/2019   Last seen for diabetes 5 months ago.  Management since then includes Jardiance 25 mg qd with Metformin 1000 mg BID He reports fair compliance with treatment. He is not having side effects.   Home blood sugar records: fasting range: 150 , was off jardiance for a month, just restarted two days ago.  Episodes of hypoglycemia? No    Current insulin regiment: none Most Recent Eye Exam: Needs to update  --------------------------------------------------------------------------------------------------- Hypertension, follow-up  BP Readings from Last 3 Encounters:  05/09/21 (!) 126/95  12/01/20 130/87  03/21/20 (!) 158/93   Wt Readings from Last 3 Encounters:  05/09/21 228 lb (103.4 kg)  12/01/20 225 lb (102.1 kg)  03/21/20 225 lb (102.1 kg)     He was last seen for hypertension 5 months ago.  BP at that visit was  130/87. Management since that visit includes Continues Lisinopril 10 mg qd . He reports excellent compliance with treatment. He is not having side effects.  He is not exercising. He is adherent to low salt diet.   Outside blood pressures are only when feeling bad.  He does not smoke.  Use of agents associated with hypertension: none.  Only checks when not feeling well 'pretty normal' at home, unable to identify numbers  ---------------------------------------------------------------------------------------------------  Medications: Outpatient Medications Prior to Visit  Medication Sig   albuterol (VENTOLIN HFA) 108 (90 Base) MCG/ACT inhaler Inhale 2 puffs into the lungs every 6 (six) hours as needed for wheezing or shortness of breath.   glimepiride (AMARYL) 1 MG tablet Take 1 tablet (1 mg total) by mouth daily with breakfast.   hydrocortisone 2.5 % lotion Apply topically daily. Tuesday, Thursday, Saturday   ketoconazole (NIZORAL) 2 % cream Apply 1 application topically daily. Monday, Wednesday, Friday   lisinopril (ZESTRIL) 10 MG tablet Take 1 tablet (10 mg total) by mouth daily.   metFORMIN (GLUCOPHAGE) 1000 MG tablet TAKE 1 TABLET TWICE DAILY WITH MEALS   mometasone-formoterol (DULERA) 100-5 MCG/ACT AERO Inhale 2 puffs into the lungs 2 (two) times daily at 10 AM and 5 PM.   mupirocin ointment (BACTROBAN) 2 % Apply 1 application topically daily. Qd to excision site   nitroGLYCERIN (NITROSTAT) 0.4 MG SL tablet Place 1 tablet (0.4 mg total) under the tongue every 5 (five) minutes as needed.   ONETOUCH ULTRA test strip USE 1 STRIP TO CHECK GLUCOSE ONCE DAILY   pravastatin (PRAVACHOL) 20 MG tablet  Take 1 tablet (20 mg total) by mouth daily.   tiotropium (SPIRIVA HANDIHALER) 18 MCG inhalation capsule Place 1 capsule (18 mcg total) into inhaler and inhale daily.   [DISCONTINUED] empagliflozin (JARDIANCE) 25 MG TABS tablet Take 1 tablet (25 mg total) by mouth daily.   No  facility-administered medications prior to visit.    Review of Systems  Constitutional:  Negative for fatigue and fever.  Respiratory:  Negative for cough and shortness of breath.   Cardiovascular:  Negative for chest pain, palpitations and leg swelling.  Neurological:  Negative for dizziness and headaches.      Objective    Blood pressure (!) 126/95, pulse 96, height 6\' 1"  (1.854 m), weight 228 lb (103.4 kg), SpO2 100 %.   Physical Exam Constitutional:      General: He is awake.     Appearance: He is well-developed.  HENT:     Head: Normocephalic.  Eyes:     Conjunctiva/sclera: Conjunctivae normal.  Cardiovascular:     Rate and Rhythm: Normal rate and regular rhythm.     Pulses:          Dorsalis pedis pulses are 3+ on the right side and 3+ on the left side.     Heart sounds: Normal heart sounds.  Pulmonary:     Effort: Pulmonary effort is normal.     Breath sounds: Normal breath sounds.  Feet:     Right foot:     Protective Sensation: 3 sites tested.  3 sites sensed.     Skin integrity: Skin integrity normal.     Toenail Condition: Right toenails are abnormally thick.     Left foot:     Protective Sensation: 3 sites tested.   1 site sensed.    Skin integrity: Skin integrity normal.     Toenail Condition: Left toenails are abnormally thick.     Comments: Bilateral feet are cold to touch, but pt in flip flops and it is 40 degrees out today. Skin:    General: Skin is warm.  Neurological:     Mental Status: He is alert and oriented to person, place, and time.  Psychiatric:        Attention and Perception: Attention normal.        Mood and Affect: Mood normal.        Speech: Speech normal.        Behavior: Behavior is cooperative.    No results found for any visits on 05/09/21.  Assessment & Plan     Problem List Items Addressed This Visit       Cardiovascular and Mediastinum   Essential (primary) hypertension    Elevated initially, improved on repeat with  slightly elevated diastolic. Pt reports always elevated when he has to get moving secondary to his chronic pain. Advised he check 2-3 times a week at home and record on paper, bring in to next visit to discuss        Endocrine   Type 2 diabetes mellitus ([place]) - Primary    Uncontrolled will recheck A1c, f/u 3 mo.  Pt has difficulties with staying compliant with meds      Relevant Medications   empagliflozin (JARDIANCE) 25 MG TABS tablet   Other Relevant Orders   HgB A1c   Comprehensive Metabolic Panel (CMET)     Nervous and Auditory   Reflex sympathetic dystrophy of lower extremity    Per pt, predominant condition. Per pt failed pain management and neuro. Declines additional referral  to neuro today. Will reach out to see if we can get him new crutches. Okay to sign handicap placard as he is unable to ambulate w/ crutches or power wheelchair      Other Visit Diagnoses     Need for shingles vaccine       Relevant Medications   Zoster Vaccine Adjuvanted [facility]) injection       Return in about 3 months (around 08/06/2021) for hypertension, DMII.      I, Patrick Kirschner, PA-C have reviewed all documentation for this visit. The documentation on  05/09/2021  for the exam, diagnosis, procedures, and orders are all accurate and complete.    Patrick Kirschner, PA-C  [facility] [phone] (phone) [phone] (fax)  [place]

## 2021-05-09 ENCOUNTER — Encounter: Payer: Self-pay | Admitting: Physician Assistant

## 2021-05-09 ENCOUNTER — Ambulatory Visit (INDEPENDENT_AMBULATORY_CARE_PROVIDER_SITE_OTHER): Payer: Medicare HMO | Admitting: Physician Assistant

## 2021-05-09 ENCOUNTER — Other Ambulatory Visit: Payer: Self-pay

## 2021-05-09 VITALS — BP 126/95 | HR 96 | Ht 73.0 in | Wt 228.0 lb

## 2021-05-09 DIAGNOSIS — Z23 Encounter for immunization: Secondary | ICD-10-CM

## 2021-05-09 DIAGNOSIS — E1100 Type 2 diabetes mellitus with hyperosmolarity without nonketotic hyperglycemic-hyperosmolar coma (NKHHC): Secondary | ICD-10-CM | POA: Diagnosis not present

## 2021-05-09 DIAGNOSIS — E119 Type 2 diabetes mellitus without complications: Secondary | ICD-10-CM | POA: Diagnosis not present

## 2021-05-09 DIAGNOSIS — I10 Essential (primary) hypertension: Secondary | ICD-10-CM | POA: Diagnosis not present

## 2021-05-09 DIAGNOSIS — G90522 Complex regional pain syndrome I of left lower limb: Secondary | ICD-10-CM | POA: Diagnosis not present

## 2021-05-09 DIAGNOSIS — J439 Emphysema, unspecified: Secondary | ICD-10-CM | POA: Diagnosis not present

## 2021-05-09 MED ORDER — EMPAGLIFLOZIN 25 MG PO TABS: 25.0000 mg | ORAL_TABLET | Freq: Every day | ORAL | 3 refills | Status: DC

## 2021-05-09 MED ORDER — ZOSTER VAC RECOMB ADJUVANTED 50 MCG/0.5ML IM SUSR: 0.5000 mL | Freq: Once | INTRAMUSCULAR | 0 refills | Status: AC

## 2021-05-09 NOTE — Assessment & Plan Note (Signed)
Uncontrolled will recheck A1c, f/u 3 mo.  Pt has difficulties with staying compliant with meds

## 2021-05-09 NOTE — Assessment & Plan Note (Signed)
Elevated initially, improved on repeat with slightly elevated diastolic. Pt reports always elevated when he has to get moving secondary to his chronic pain. Advised he check 2-3 times a week at home and record on paper, bring in to next visit to discuss

## 2021-05-09 NOTE — Assessment & Plan Note (Addendum)
Per pt, predominant condition. Per pt failed pain management and neuro. Declines additional referral to neuro today. Will reach out to see if we can get him new crutches. Okay to sign handicap placard as he is unable to ambulate w/ crutches or power wheelchair

## 2021-05-10 ENCOUNTER — Encounter: Payer: Self-pay | Admitting: Physician Assistant

## 2021-05-10 DIAGNOSIS — Z9989 Dependence on other enabling machines and devices: Secondary | ICD-10-CM

## 2021-05-10 DIAGNOSIS — G90522 Complex regional pain syndrome I of left lower limb: Secondary | ICD-10-CM

## 2021-05-10 LAB — COMPREHENSIVE METABOLIC PANEL
ALT: 20 IU/L (ref 0–44)
AST: 17 IU/L (ref 0–40)
Albumin/Globulin Ratio: 2.5 — ABNORMAL HIGH (ref 1.2–2.2)
Albumin: 5.2 g/dL — ABNORMAL HIGH (ref 3.8–4.8)
Alkaline Phosphatase: 99 IU/L (ref 44–121)
BUN/Creatinine Ratio: 16 (ref 10–24)
BUN: 17 mg/dL (ref 8–27)
Bilirubin Total: 0.4 mg/dL (ref 0.0–1.2)
CO2: 20 mmol/L (ref 20–29)
Calcium: 9.9 mg/dL (ref 8.6–10.2)
Chloride: 100 mmol/L (ref 96–106)
Creatinine, Ser: 1.06 mg/dL (ref 0.76–1.27)
Globulin, Total: 2.1 g/dL (ref 1.5–4.5)
Glucose: 154 mg/dL — ABNORMAL HIGH (ref 70–99)
Potassium: 4.8 mmol/L (ref 3.5–5.2)
Sodium: 137 mmol/L (ref 134–144)
Total Protein: 7.3 g/dL (ref 6.0–8.5)
eGFR: 79 mL/min/{1.73_m2} (ref >59)

## 2021-05-10 LAB — HEMOGLOBIN A1C
Est. average glucose Bld gHb Est-mCnc: 192 mg/dL
Hgb A1c MFr Bld: 8.3 % — ABNORMAL HIGH (ref 4.8–5.6)

## 2021-05-29 ENCOUNTER — Telehealth: Payer: Self-pay

## 2021-05-29 NOTE — Progress Notes (Signed)
? ? ?Chronic Care Management ?Pharmacy Assistant  ? ?Name: Patrick Grant  MRN: [id number] DOB: 1956/03/01 ? ?Reason for Encounter: Diabetes Disease State Call ? ?Recent office visits:  ?05/09/2021 [name], PA-C (PCP Office Visit) for Type II DM- No medication changes note, lab orders placed, patient to follow-up in 3 months ? ?Recent consult visits:  ?None ID ? ?Hospital visits:  ?None in previous 6 months ? ?Medications: ?Outpatient Encounter Medications as of 05/29/2021  ?Medication Sig Note  ? albuterol (VENTOLIN HFA) 108 (90 Base) MCG/ACT inhaler Inhale 2 puffs into the lungs every 6 (six) hours as needed for wheezing or shortness of breath.   ? empagliflozin (JARDIANCE) 25 MG TABS tablet Take 1 tablet (25 mg total) by mouth daily.   ? glimepiride (AMARYL) 1 MG tablet Take 1 tablet (1 mg total) by mouth daily with breakfast.   ? hydrocortisone 2.5 % lotion Apply topically daily. Tuesday, Thursday, Saturday   ? ketoconazole (NIZORAL) 2 % cream Apply 1 application topically daily. Monday, Wednesday, Friday   ? lisinopril (ZESTRIL) 10 MG tablet Take 1 tablet (10 mg total) by mouth daily.   ? metFORMIN (GLUCOPHAGE) 1000 MG tablet TAKE 1 TABLET TWICE DAILY WITH MEALS   ? mometasone-formoterol (DULERA) 100-5 MCG/ACT AERO Inhale 2 puffs into the lungs 2 (two) times daily at 10 AM and 5 PM.   ? mupirocin ointment (BACTROBAN) 2 % Apply 1 application topically daily. Qd to excision site   ? nitroGLYCERIN (NITROSTAT) 0.4 MG SL tablet Place 1 tablet (0.4 mg total) under the tongue every 5 (five) minutes as needed. 06/12/2017: Reports has not needed recently   ? ONETOUCH ULTRA test strip USE 1 STRIP TO CHECK GLUCOSE ONCE DAILY   ? pravastatin (PRAVACHOL) 20 MG tablet Take 1 tablet (20 mg total) by mouth daily.   ? tiotropium (SPIRIVA HANDIHALER) 18 MCG inhalation capsule Place 1 capsule (18 mcg total) into inhaler and inhale daily.   ? ?No facility-administered encounter medications on file as of 05/29/2021.  ? ?Care  Gaps: ?Diabetic Eye Exam ?Zoster Vaccine ?COVID-19 Vaccine Booster 3 ? ?Star Rating Drugs: ?Jardiance 25 mg patient receives this medication through patient assistance with [facility]. Patient last received a 90-Day supply in February ?Lisinopril 10 mg last filled on 03/13/2021 for a 90-Day supply with [facility] ?Metformin 1000 mg last filled on 03/13/2021 for a 90-Day supply with [facility] ?Pravastatin 20 mg last filled on 03/13/2021 for a 90-Day supply with [facility] ?Glimepiride 1 mg last filled on 03/04/2021 for a 90-Day supply with [facility] ? ?Recent Relevant Labs: ?Lab Results  ?Component Value Date/Time  ? HGBA1C 8.3 (H) 05/09/2021 10:25 AM  ? HGBA1C 8.8 (H) 12/02/2020 08:04 AM  ? MICROALBUR negative 01/10/2018 03:02 PM  ? MICROALBUR 20 01/25/2017 01:55 PM  ?  ?Kidney Function ?Lab Results  ?Component Value Date/Time  ? CREATININE 1.06 05/09/2021 10:25 AM  ? CREATININE 0.97 12/02/2020 08:04 AM  ? CREATININE 0.92 02/06/2017 09:40 AM  ? GFRNONAA 67 03/21/2020 09:52 AM  ? GFRNONAA 91 02/06/2017 09:40 AM  ? GFRAA 78 03/21/2020 09:52 AM  ? GFRAA 105 02/06/2017 09:40 AM  ? ? ?Current antihyperglycemic regimen:  ?Jardiance 25 mg 1 tablet daily ?Metformin 1000 mg 1 tablet daily ?Glimepiride 1 mg 1 tablet daily ? ?What recent interventions/DTPs have been made to improve glycemic control:  ?None ID ? ?Have there been any recent hospitalizations or ED visits since last visit with CPP? No ? ?Patient denies hypoglycemic symptoms,  including Pale, Sweaty, Shaky, Hungry, Nervous/irritable, and Vision changes ? ?Patient denies hyperglycemic symptoms, including blurry vision, excessive thirst, fatigue, polyuria, and weakness ? ?How often are you checking your blood sugar? once daily ?What are your blood sugars ranging?  ?Fasting: 145 this morning ? ?During the week, how often does your blood glucose drop below 70? Never ?Are you checking your feet daily/regularly? Yes ? ?Adherence Review: ?Is the patient  currently on a STATIN medication? Yes ?Is the patient currently on ACE/ARB medication? Yes ?Does the patient have >5 day gap between last estimated fill dates? No ? ?Spoke with patient and he reports he is not feeling so well today. Patient stated that his left leg pain has been so painful the last week that he is not sleeping. He reports that this pain is caused by his diagnosis of CRPS, and that the pain has been worse these last few days to the point it wakes him up out of his sleep and he is only sleeping about 4 hours.  ? ?He reports due to the pain he has lost his appetite. He stated he has been eating soup just to put something on his stomach but he really doesn't want to eat. He denies any issues with his blood sugars being too low or too high. He is not taking any medications OTC for the pain as he states they do not work for him. Patient also doesn't want a prescription for the pain as he used to be on several pain medications, and he did not like the side effects so he was able to wean off those medications.  ? ?I did offer to contact the PCP's office for him regarding his symptoms, but per patient he doesn't feel like they can do anything as he advised he's seen specialist and noone has been able to control his pain. Patient stated he was going to rest and get through the pain as best as possible.  ? ?Patient reports taking all his medications as directed with no issues, he denies needing refills on anything at this time. Patient stated that he did get his first shipment of Jardiance this year and he has no issues with the patient assistance at this time. ? ?Patient did allow me to schedule a follow-up with CPP for next month. He did request a call the day prior to remind him about the appointment as he stated it depends on his mood if he will keep the appointment. I confirmed I would give him a call the day prior remind him. Patient has no other concerns or issues at this time. ? ?Patient has been  scheduled with a telephone appointment with [name], CPP on 07/11/2021 @ 1500. ? ?[name], CPA/CMA ?Clinical Pharmacist Assistant ?Phone: [phone]  ? ? ? ? ? ? ?

## 2021-07-11 ENCOUNTER — Telehealth: Payer: Medicare HMO

## 2021-07-29 ENCOUNTER — Other Ambulatory Visit: Payer: Self-pay | Admitting: Family Medicine

## 2021-07-29 DIAGNOSIS — E1100 Type 2 diabetes mellitus with hyperosmolarity without nonketotic hyperglycemic-hyperosmolar coma (NKHHC): Secondary | ICD-10-CM

## 2021-08-06 ENCOUNTER — Other Ambulatory Visit: Payer: Self-pay | Admitting: Family Medicine

## 2021-08-06 DIAGNOSIS — E1100 Type 2 diabetes mellitus with hyperosmolarity without nonketotic hyperglycemic-hyperosmolar coma (NKHHC): Secondary | ICD-10-CM

## 2021-08-08 ENCOUNTER — Ambulatory Visit (INDEPENDENT_AMBULATORY_CARE_PROVIDER_SITE_OTHER): Payer: Medicare HMO | Admitting: Physician Assistant

## 2021-08-08 ENCOUNTER — Encounter: Payer: Self-pay | Admitting: Physician Assistant

## 2021-08-08 VITALS — BP 113/81 | HR 108 | Temp 98.3°F | Ht 73.0 in | Wt 218.7 lb

## 2021-08-08 DIAGNOSIS — J029 Acute pharyngitis, unspecified: Secondary | ICD-10-CM

## 2021-08-08 DIAGNOSIS — J069 Acute upper respiratory infection, unspecified: Secondary | ICD-10-CM

## 2021-08-08 LAB — POCT INFLUENZA A/B
Influenza A, POC: NEGATIVE
Influenza B, POC: NEGATIVE

## 2021-08-08 LAB — POCT RAPID STREP A (OFFICE): Rapid Strep A Screen: NEGATIVE

## 2021-08-08 NOTE — Progress Notes (Signed)
?  ?I,Patrick Grant,acting as a Education administrator for [employer], PA-C.,have documented all relevant documentation on the behalf of Patrick Kirschner, PA-C,as directed by  Patrick Kirschner, PA-C while in the presence of Patrick Kirschner, PA-C. ? ?Established patient visit ? ? ?Patient: Patrick Grant   DOB: Jan 12, 1956   66 y.o. Male  MRN: [id number] ?Visit Date: 08/08/2021 ? ?Today's healthcare provider: [name], PA-C  ? ?Cc. Headache, sore throat x 4-5 days ? ?Subjective  ?  ?HPI  ? ?Patrick Grant is a 66 y/o male who presents today with sore throat, body aches, 'feels on fire', headache, occasional cough. Been around someone else with similar symptoms. Reports two negative at home covid tests. Denies measuring a fever at home. Denies chest pain, abdominal pain, changes in BM. Has not taken anything OTC. ? ?Medications: ?Outpatient Medications Prior to Visit  ?Medication Sig  ? albuterol (VENTOLIN HFA) 108 (90 Base) MCG/ACT inhaler Inhale 2 puffs into the lungs every 6 (six) hours as needed for wheezing or shortness of breath.  ? empagliflozin (JARDIANCE) 25 MG TABS tablet Take 1 tablet (25 mg total) by mouth daily.  ? glimepiride (AMARYL) 1 MG tablet TAKE 1 TABLET EVERY DAY WITH BREAKFAST  ? hydrocortisone 2.5 % lotion Apply topically daily. Tuesday, Thursday, Saturday  ? ketoconazole (NIZORAL) 2 % cream Apply 1 application topically daily. Monday, Wednesday, Friday  ? lisinopril (ZESTRIL) 10 MG tablet Take 1 tablet (10 mg total) by mouth daily.  ? metFORMIN (GLUCOPHAGE) 1000 MG tablet TAKE 1 TABLET TWICE DAILY WITH MEALS  ? mometasone-formoterol (DULERA) 100-5 MCG/ACT AERO Inhale 2 puffs into the lungs 2 (two) times daily at 10 AM and 5 PM.  ? mupirocin ointment (BACTROBAN) 2 % Apply 1 application topically daily. Qd to excision site  ? nitroGLYCERIN (NITROSTAT) 0.4 MG SL tablet Place 1 tablet (0.4 mg total) under the tongue every 5 (five) minutes as needed.  ? ONETOUCH ULTRA test strip USE 1 STRIP TO CHECK GLUCOSE ONCE  DAILY  ? pravastatin (PRAVACHOL) 20 MG tablet Take 1 tablet (20 mg total) by mouth daily.  ? tiotropium (SPIRIVA HANDIHALER) 18 MCG inhalation capsule Place 1 capsule (18 mcg total) into inhaler and inhale daily.  ? ?No facility-administered medications prior to visit.  ? ? ?Review of Systems  ?Constitutional:  Positive for fever. Negative for fatigue.  ?HENT:  Positive for congestion, sinus pressure, sinus pain and sore throat.   ?Respiratory:  Positive for cough. Negative for shortness of breath.   ?Cardiovascular:  Negative for chest pain, palpitations and leg swelling.  ?Neurological:  Positive for headaches. Negative for dizziness.  ? ? ?  Objective  ?  ?Blood pressure 113/81, pulse (!) 108, temperature 98.3 ?F (36.8 ?C), temperature source Oral, height '6\' 1"'$  (1.854 m), weight 218 lb 11.2 oz (99.2 kg), SpO2 99 %.  ? ?Physical Exam ?Constitutional:   ?   General: Patrick Grant is awake.  ?   Appearance: Patrick Grant is well-developed.  ?HENT:  ?   Head: Normocephalic.  ?   Right Ear: Tympanic membrane normal.  ?   Left Ear: Tympanic membrane normal.  ?   Nose: Congestion present.  ?   Mouth/Throat:  ?   Pharynx: Posterior oropharyngeal erythema present. No oropharyngeal exudate.  ?Eyes:  ?   Conjunctiva/sclera: Conjunctivae normal.  ?Cardiovascular:  ?   Rate and Rhythm: Normal rate and regular rhythm.  ?   Heart sounds: Normal heart sounds.  ?Pulmonary:  ?   Effort: Pulmonary effort is normal.  ?  Breath sounds: Normal breath sounds.  ?Skin: ?   General: Skin is warm.  ?Neurological:  ?   Mental Status: Patrick Grant is alert and oriented to person, place, and time.  ?Psychiatric:     ?   Attention and Perception: Attention normal.     ?   Mood and Affect: Mood normal.     ?   Speech: Speech normal.     ?   Behavior: Behavior is cooperative.  ?  ? ?No results found for any visits on 08/08/21. ? Assessment & Plan  ?  ? ?URI ?POC strep neg ?POC flu neg ? ?Advised fluids, rest, tylenol for headaches/sore throat  ? ?Needs to f/u for chronic care  f/u  ? ?I, Patrick Kirschner, PA-C have reviewed all documentation for this visit. The documentation on  08/08/2021 for the exam, diagnosis, procedures, and orders are all accurate and complete. ? ?Patrick Kirschner, PA-C ?[facility] ?[id number] ?[place], [place], [address] ?Office: [phone] ?Fax: [phone]  ? ?[facility] ? ?

## 2021-08-23 ENCOUNTER — Ambulatory Visit (INDEPENDENT_AMBULATORY_CARE_PROVIDER_SITE_OTHER): Payer: Medicare HMO

## 2021-08-23 VITALS — Wt 218.0 lb

## 2021-08-23 DIAGNOSIS — Z0000 Encounter for general adult medical examination without abnormal findings: Secondary | ICD-10-CM

## 2021-08-23 NOTE — Patient Instructions (Signed)
Patrick Grant , Thank you for taking time to come for your Medicare Wellness Visit. I appreciate your ongoing commitment to your health goals. Please review the following plan we discussed and let me know if I can assist you in the future.   Screening recommendations/referrals: Colonoscopy: 05/18/19 Recommended yearly ophthalmology/optometry visit for glaucoma screening and checkup Recommended yearly dental visit for hygiene and checkup  Vaccinations: Influenza vaccine: 01/25/21 Pneumococcal vaccine: 01/09/17 Tdap vaccine: 10/10/20 Shingles vaccine: n/d   Covid-19: 06/22/19, 07/21/19  Advanced directives: no  Conditions/risks identified: none  Next appointment: Follow up in one year for your annual wellness visit 08/27/22 @ 1pm by phone  Preventive Care 40-64 Years, Male Preventive care refers to lifestyle choices and visits with your health care provider that can promote health and wellness. What does preventive care include? A yearly physical exam. This is also called an annual well check. Dental exams once or twice a year. Routine eye exams. Ask your health care provider how often you should have your eyes checked. Personal lifestyle choices, including: Daily care of your teeth and gums. Regular physical activity. Eating a healthy diet. Avoiding tobacco and drug use. Limiting alcohol use. Practicing safe sex. Taking low-dose aspirin every day starting at age 54. What happens during an annual well check? The services and screenings done by your health care provider during your annual well check will depend on your age, overall health, lifestyle risk factors, and family history of disease. Counseling  Your health care provider may ask you questions about your: Alcohol use. Tobacco use. Drug use. Emotional well-being. Home and relationship well-being. Sexual activity. Eating habits. Work and work Statistician. Screening  You may have the following tests or  measurements: Height, weight, and BMI. Blood pressure. Lipid and cholesterol levels. These may be checked every 5 years, or more frequently if you are over 72 years old. Skin check. Lung cancer screening. You may have this screening every year starting at age 56 if you have a 30-pack-year history of smoking and currently smoke or have quit within the past 15 years. Fecal occult blood test (FOBT) of the stool. You may have this test every year starting at age 49. Flexible sigmoidoscopy or colonoscopy. You may have a sigmoidoscopy every 5 years or a colonoscopy every 10 years starting at age 77. Prostate cancer screening. Recommendations will vary depending on your family history and other risks. Hepatitis C blood test. Hepatitis B blood test. Sexually transmitted disease (STD) testing. Diabetes screening. This is done by checking your blood sugar (glucose) after you have not eaten for a while (fasting). You may have this done every 1-3 years. Discuss your test results, treatment options, and if necessary, the need for more tests with your health care provider. Vaccines  Your health care provider may recommend certain vaccines, such as: Influenza vaccine. This is recommended every year. Tetanus, diphtheria, and acellular pertussis (Tdap, Td) vaccine. You may need a Td booster every 10 years. Zoster vaccine. You may need this after age 77. Pneumococcal 13-valent conjugate (PCV13) vaccine. You may need this if you have certain conditions and have not been vaccinated. Pneumococcal polysaccharide (PPSV23) vaccine. You may need one or two doses if you smoke cigarettes or if you have certain conditions. Talk to your health care provider about which screenings and vaccines you need and how often you need them. This information is not intended to replace advice given to you by your health care provider. Make sure you discuss any questions you have with  your health care provider. Document Released:  04/08/2015 Document Revised: 11/30/2015 Document Reviewed: 01/11/2015 Elsevier Interactive Patient Education  2017 [place] Prevention in the Home Falls can cause injuries. They can happen to people of all ages. There are many things you can do to make your home safe and to help prevent falls. What can I do on the outside of my home? Regularly fix the edges of walkways and driveways and fix any cracks. Remove anything that might make you trip as you walk through a door, such as a raised step or threshold. Trim any bushes or trees on the path to your home. Use bright outdoor lighting. Clear any walking paths of anything that might make someone trip, such as rocks or tools. Regularly check to see if handrails are loose or broken. Make sure that both sides of any steps have handrails. Any raised decks and porches should have guardrails on the edges. Have any leaves, snow, or ice cleared regularly. Use sand or salt on walking paths during winter. Clean up any spills in your garage right away. This includes oil or grease spills. What can I do in the bathroom? Use night lights. Install grab bars by the toilet and in the tub and shower. Do not use towel bars as grab bars. Use non-skid mats or decals in the tub or shower. If you need to sit down in the shower, use a plastic, non-slip stool. Keep the floor dry. Clean up any water that spills on the floor as soon as it happens. Remove soap buildup in the tub or shower regularly. Attach bath mats securely with double-sided non-slip rug tape. Do not have throw rugs and other things on the floor that can make you trip. What can I do in the bedroom? Use night lights. Make sure that you have a light by your bed that is easy to reach. Do not use any sheets or blankets that are too big for your bed. They should not hang down onto the floor. Have a firm chair that has side arms. You can use this for support while you get dressed. Do not have  throw rugs and other things on the floor that can make you trip. What can I do in the kitchen? Clean up any spills right away. Avoid walking on wet floors. Keep items that you use a lot in easy-to-reach places. If you need to reach something above you, use a strong step stool that has a grab bar. Keep electrical cords out of the way. Do not use floor polish or wax that makes floors slippery. If you must use wax, use non-skid floor wax. Do not have throw rugs and other things on the floor that can make you trip. What can I do with my stairs? Do not leave any items on the stairs. Make sure that there are handrails on both sides of the stairs and use them. Fix handrails that are broken or loose. Make sure that handrails are as long as the stairways. Check any carpeting to make sure that it is firmly attached to the stairs. Fix any carpet that is loose or worn. Avoid having throw rugs at the top or bottom of the stairs. If you do have throw rugs, attach them to the floor with carpet tape. Make sure that you have a light switch at the top of the stairs and the bottom of the stairs. If you do not have them, ask someone to add them for you. What else  can I do to help prevent falls? Wear shoes that: Do not have high heels. Have rubber bottoms. Are comfortable and fit you well. Are closed at the toe. Do not wear sandals. If you use a stepladder: Make sure that it is fully opened. Do not climb a closed stepladder. Make sure that both sides of the stepladder are locked into place. Ask someone to hold it for you, if possible. Clearly mark and make sure that you can see: Any grab bars or handrails. First and last steps. Where the edge of each step is. Use tools that help you move around (mobility aids) if they are needed. These include: Canes. Walkers. Scooters. Crutches. Turn on the lights when you go into a dark area. Replace any light bulbs as soon as they burn out. Set up your furniture so  you have a clear path. Avoid moving your furniture around. If any of your floors are uneven, fix them. If there are any pets around you, be aware of where they are. Review your medicines with your doctor. Some medicines can make you feel dizzy. This can increase your chance of falling. Ask your doctor what other things that you can do to help prevent falls. This information is not intended to replace advice given to you by your health care provider. Make sure you discuss any questions you have with your health care provider. Document Released: 01/06/2009 Document Revised: 08/18/2015 Document Reviewed: 04/16/2014 Elsevier Interactive Patient Education  2017 [employer].

## 2021-08-23 NOTE — Progress Notes (Signed)
Virtual Visit via Telephone Note  I connected with  Patrick Grant on 08/23/21 at  1:45 PM EDT by telephone and verified that I am speaking with the correct person using two identifiers.  Location: Patient: home Provider: [facility] Persons participating in the virtual visit: [place]   I discussed the limitations, risks, security and privacy concerns of performing an evaluation and management service by telephone and the availability of in person appointments. The patient expressed understanding and agreed to proceed.  Interactive audio and video telecommunications were attempted between this nurse and patient, however failed, due to patient having technical difficulties OR patient did not have access to video capability.  We continued and completed visit with audio only.  Some vital signs may be absent or patient reported.   Dionisio David, LPN  Subjective:   Patrick Grant is a 66 y.o. male who presents for Medicare Annual/Subsequent preventive examination.  Review of Systems           Objective:    There were no vitals filed for this visit. There is no height or weight on file to calculate BMI.     02/23/2020   11:11 AM 05/18/2019    9:17 AM 04/17/2019    9:41 AM 02/16/2019   10:42 AM 02/13/2018    9:42 AM 05/13/2017   10:15 AM 04/17/2017   11:57 AM  Advanced Directives  Does Patient Have a Medical Advance Directive? No No No No No Yes No  Type of Teacher, early years/pre;Living will   Copy of [place] in Chart?      No - copy requested   Would patient like information on creating a medical advance directive? No - Patient declined No - Patient declined  Yes (ED - Information included in AVS) Yes (MAU/Ambulatory/Procedural Areas - Information given)  Yes (MAU/Ambulatory/Procedural Areas - Information given)    Current Medications (verified) Outpatient Encounter Medications as of 08/23/2021  Medication  Sig   albuterol (VENTOLIN HFA) 108 (90 Base) MCG/ACT inhaler Inhale 2 puffs into the lungs every 6 (six) hours as needed for wheezing or shortness of breath.   empagliflozin (JARDIANCE) 25 MG TABS tablet Take 1 tablet (25 mg total) by mouth daily.   FLUZONE QUADRIVALENT 0.5 ML injection    glimepiride (AMARYL) 1 MG tablet TAKE 1 TABLET EVERY DAY WITH BREAKFAST   hydrocortisone 2.5 % lotion Apply topically daily. Tuesday, Thursday, Saturday   ketoconazole (NIZORAL) 2 % cream Apply 1 application topically daily. Monday, Wednesday, Friday   lisinopril (ZESTRIL) 10 MG tablet TAKE 1 TABLET EVERY DAY   metFORMIN (GLUCOPHAGE) 1000 MG tablet TAKE 1 TABLET TWICE DAILY WITH MEALS   mometasone-formoterol (DULERA) 100-5 MCG/ACT AERO Inhale 2 puffs into the lungs 2 (two) times daily at 10 AM and 5 PM.   mupirocin ointment (BACTROBAN) 2 % Apply 1 application topically daily. Qd to excision site   nitroGLYCERIN (NITROSTAT) 0.4 MG SL tablet Place 1 tablet (0.4 mg total) under the tongue every 5 (five) minutes as needed.   ONETOUCH ULTRA test strip USE 1 STRIP TO CHECK GLUCOSE ONCE DAILY   pravastatin (PRAVACHOL) 20 MG tablet TAKE 1 TABLET EVERY DAY   tiotropium (SPIRIVA HANDIHALER) 18 MCG inhalation capsule Place 1 capsule (18 mcg total) into inhaler and inhale daily.   No facility-administered encounter medications on file as of 08/23/2021.    Allergies (verified) Almond (diagnostic), Fentanyl, and Clove flavor   History:  Past Medical History:  Diagnosis Date   BCC (basal cell carcinoma) 05/16/2020   right chest. Nodular, EXC 06/21/20   COPD (chronic obstructive pulmonary disease) (HCC)    CRPS (complex regional pain syndrome)    Diabetes mellitus without complication ([place])    Dizzy spells    occasional   Hyperlipidemia    Hypertension    Shingles 04/17/2019   Sleep apnea    Uses crutches    or wheelchair   Past Surgical History:  Procedure Laterality Date   COLONOSCOPY WITH PROPOFOL N/A  05/18/2019   Procedure: COLONOSCOPY WITH PROPOFOL;  Surgeon: Jonathon Bellows, MD;  Location: [facility];  Service: Endoscopy;  Laterality: N/A;  Diabetic - oral meds sleep apnea   POLYPECTOMY N/A 05/18/2019   Procedure: POLYPECTOMY;  Surgeon: Jonathon Bellows, MD;  Location: [facility];  Service: Endoscopy;  Laterality: N/A;   SPINAL CORD STIMULATOR REMOVAL  2010   DUE TO LOSS OF MUSCLE USE WHEN ACTIVATED   Family History  Problem Relation Age of Onset   CVA Mother    Kidney failure Mother    Diabetes Mother    CVA Father    Kidney failure Father    Colon cancer Father    Ovarian cancer Sister    Diabetes Brother    Healthy Sister    Social History   Socioeconomic History   Marital status: Single    Spouse name: Not on file   Number of children: 0   Years of education: Not on file   Highest education level: Some college, no degree  Occupational History   Occupation: disability  Tobacco Use   Smoking status: Former    Types: Cigars    Quit date: 03/12/2016    Years since quitting: 5.4   Smokeless tobacco: Never  Vaping Use   Vaping Use: Never used  Substance and Sexual Activity   Alcohol use: No    Alcohol/week: 0.0 standard drinks   Drug use: No   Sexual activity: Not on file  Other Topics Concern   Not on file  Social History Narrative   Not on file   Social Determinants of Health   Financial Resource Strain: Medium Risk   Difficulty of Paying Living Expenses: Somewhat hard  Food Insecurity: Not on file  Transportation Needs: Not on file  Physical Activity: Not on file  Stress: Not on file  Social Connections: Not on file    Tobacco Counseling Counseling given: Not Answered   Clinical Intake:  Pre-visit preparation completed: Yes  Pain : No/denies pain     Nutritional Risks: None Diabetes: Yes CBG done?: No Did pt. bring in CBG monitor from home?: No  How often do you need to have someone help you when you read instructions,  pamphlets, or other written materials from your doctor or pharmacy?: 1 - Never  Diabetic?yes Nutrition Risk Assessment:  Has the patient had any N/V/D within the last 2 months?  Yes  Does the patient have any non-healing wounds?  No  Has the patient had any unintentional weight loss or weight gain?  No   Diabetes:  Is the patient diabetic?  Yes  If diabetic, was a CBG obtained today?  No  Did the patient bring in their glucometer from home?  No  How often do you monitor your CBG's? Every morning, or when feels bad.   Financial Strains and Diabetes Management:  Are you having any financial strains with the device, your supplies or your medication?  No .  Does the patient want to be seen by Chronic Care Management for management of their diabetes?  No  Would the patient like to be referred to a Nutritionist or for Diabetic Management?  No   Diabetic Exams:  Diabetic Eye Exam: Completed is making an appointment to go shortly, as soon as he feels better. Overdue for diabetic eye exam. Pt has been advised about the importance in completing this exam.   Diabetic Foot Exam: Completed 05/09/21. Pt has been advised about the importance in completing this exam.    Interpreter Needed?: No  Information entered by :: Kirke Shaggy, LPN   Activities of Daily Living    05/09/2021    9:27 AM 12/01/2020    2:29 PM  In your present state of health, do you have any difficulty performing the following activities:  Hearing? 0 0  Vision? 1 1  Comment reading glasses   Difficulty concentrating or making decisions? 1 0  Walking or climbing stairs? 1 1  Dressing or bathing? 0 0  Doing errands, shopping? 1 1    Patient Care Team: [name], PA-C as PCP - General (Physician Assistant) Allyne Gee, MD as Consulting Physician (Internal Medicine) [place], [facility]) Jonathon Bellows, MD as Consulting Physician (Gastroenterology) Germaine Pomfret, [facility] as Pharmacist  (Pharmacist)  Indicate any recent Medical Services you may have received from other than [facility] providers in the past year (date may be approximate).     Assessment:   This is a routine wellness examination for [place].  Hearing/Vision screen No results found.  Dietary issues and exercise activities discussed:     Goals Addressed   None    Depression Screen    05/09/2021    9:27 AM 12/01/2020    2:28 PM 03/21/2020    9:35 AM 02/23/2020   11:07 AM 12/15/2019   10:29 AM 02/16/2019   10:49 AM 02/13/2018    9:42 AM  PHQ 2/9 Scores  PHQ - 2 Score 0 0 0 1 0 0 0  PHQ- 9 Score '1 2 1  '$ 0      Fall Risk    05/09/2021    9:26 AM 12/01/2020    2:28 PM 03/21/2020    9:36 AM 02/23/2020   11:12 AM 12/15/2019   10:33 AM  Fall Risk   Falls in the past year? '1 1 1 1 1  '$ Number falls in past yr: '1 1 1 1 1  '$ Injury with Fall? 1 0 1 0 1  Risk for fall due to : No Fall Risks;Other (Comment) History of fall(s)  Impaired mobility;Impaired balance/gait History of fall(s);Impaired balance/gait;Impaired mobility  Risk for fall due to: Comment CRPS      Follow up  Falls evaluation completed  Falls prevention discussed Falls evaluation completed;Falls prevention discussed    FALL RISK PREVENTION PERTAINING TO THE HOME:  Any stairs in or around the home? Yes  If so, are there any without handrails? No  Home free of loose throw rugs in walkways, pet beds, electrical cords, etc? Yes  Adequate lighting in your home to reduce risk of falls? Yes   ASSISTIVE DEVICES UTILIZED TO PREVENT FALLS:  Life alert? No  Use of a cane, walker or w/c? Yes  Grab bars in the bathroom? Yes  Shower chair or bench in shower? Yes  Elevated toilet seat or a handicapped toilet? Yes     Cognitive Function:pt feels unwell and declined test  02/16/2019   10:56 AM 01/31/2016    1:25 PM  6CIT Screen  What Year? 0 points 0 points  What month? 0 points 3 points  What time? 0 points 0 points  Count back from 20  0 points 0 points  Months in reverse 4 points 4 points  Repeat phrase 2 points 6 points  Total Score 6 points 13 points    Immunizations Immunization History  Administered Date(s) Administered   Influenza,inj,Quad PF,6+ Mos 01/01/2014, 02/05/2015, 01/31/2016, 01/09/2017, 01/10/2018, 12/15/2019   Influenza-Unspecified 01/25/2021   PFIZER(Purple Top)SARS-COV-2 Vaccination 06/22/2019, 07/21/2019   Pneumococcal Polysaccharide-23 01/09/2017   Tdap 10/10/2020    TDAP status: Up to date  Flu Vaccine status: Up to date  Pneumococcal vaccine status: Due, Education has been provided regarding the importance of this vaccine. Advised may receive this vaccine at local pharmacy or Health Dept. Aware to provide a copy of the vaccination record if obtained from local pharmacy or Health Dept. Verbalized acceptance and understanding.  Covid-19 vaccine status: Completed vaccines  Qualifies for Shingles Vaccine? Yes   Zostavax completed No   Shingrix Completed?: No.    Education has been provided regarding the importance of this vaccine. Patient has been advised to call insurance company to determine out of pocket expense if they have not yet received this vaccine. Advised may also receive vaccine at local pharmacy or Health Dept. Verbalized acceptance and understanding.  Screening Tests Health Maintenance  Topic Date Due   OPHTHALMOLOGY EXAM  Never done   Zoster Vaccines- Shingrix (1 of 2) Never done   COVID-19 Vaccine (3 - Pfizer risk series) 08/18/2019   INFLUENZA VACCINE  10/24/2021   HEMOGLOBIN A1C  11/06/2021   FOOT EXAM  05/09/2022   COLONOSCOPY (Pts 45-34yr Insurance coverage will need to be confirmed)  05/17/2022   TETANUS/TDAP  10/11/2030   Hepatitis C Screening  Completed   HIV Screening  Completed   HPV VACCINES  Aged Out   Fecal DNA (Cologuard)  Discontinued    Health Maintenance  Health Maintenance Due  Topic Date Due   OPHTHALMOLOGY EXAM  Never done   Zoster Vaccines-  Shingrix (1 of 2) Never done   COVID-19 Vaccine (3 - Pfizer risk series) 08/18/2019    Colorectal cancer screening: Type of screening: Colonoscopy. Completed 05/18/19. Repeat every 3 years  Lung Cancer Screening: (Low Dose CT Chest recommended if Age 64-80years, 30 pack-year currently smoking OR have quit w/in 15years.) does not qualify.   Additional Screening:  Hepatitis C Screening: does qualify; Completed 02/06/17  Vision Screening: Recommended annual ophthalmology exams for early detection of glaucoma and other disorders of the eye. Is the patient up to date with their annual eye exam?  No  Who is the provider or what is the name of the office in which the patient attends annual eye exams? No one If pt is not established with a provider, would they like to be referred to a provider to establish care? No .   Dental Screening: Recommended annual dental exams for proper oral hygiene  Community Resource Referral / Chronic Care Management: CRR required this visit?  No   CCM required this visit?  No      Plan:     I have personally reviewed and noted the following in the patient's chart:   Medical and social history Use of alcohol, tobacco or illicit drugs  Current medications and supplements including opioid prescriptions. Patient is not currently taking opioid prescriptions. Functional ability and status  Nutritional status Physical activity Advanced directives List of other physicians Hospitalizations, surgeries, and ER visits in previous 12 months Vitals Screenings to include cognitive, depression, and falls Referrals and appointments  In addition, I have reviewed and discussed with patient certain preventive protocols, quality metrics, and best practice recommendations. A written personalized care plan for preventive services as well as general preventive health recommendations were provided to patient.     Dionisio David, LPN   [id number]   Nurse Notes:  none

## 2021-08-29 ENCOUNTER — Telehealth: Payer: Self-pay

## 2021-08-29 NOTE — Progress Notes (Signed)
Chronic Care Management Pharmacy Assistant   Name: Patrick Grant  MRN: [id number] DOB: July 04, 1955  Reason for Encounter: Diabetes Disease State Call   Recent office visits:  08/23/2021 Kirke Shaggy, LPN (PCP Clinical Support) Medicare Wellness Exam- No medication changes noted, No orders placed  08/08/2021 [name], PA-C (PCP Office Visit) for Viral Upper Respiratory Tract Infection- No medication changes noted, Lab orders placed, No follow-up noted  Recent consult visits:  None ID  Hospital visits:  None in previous 6 months  Medications: Outpatient Encounter Medications as of 08/29/2021  Medication Sig Note   albuterol (VENTOLIN HFA) 108 (90 Base) MCG/ACT inhaler Inhale 2 puffs into the lungs every 6 (six) hours as needed for wheezing or shortness of breath.    empagliflozin (JARDIANCE) 25 MG TABS tablet Take 1 tablet (25 mg total) by mouth daily.    FLUZONE QUADRIVALENT 0.5 ML injection     glimepiride (AMARYL) 1 MG tablet TAKE 1 TABLET EVERY DAY WITH BREAKFAST    hydrocortisone 2.5 % lotion Apply topically daily. Tuesday, Thursday, Saturday    ketoconazole (NIZORAL) 2 % cream Apply 1 application topically daily. Monday, Wednesday, Friday    lisinopril (ZESTRIL) 10 MG tablet TAKE 1 TABLET EVERY DAY    metFORMIN (GLUCOPHAGE) 1000 MG tablet TAKE 1 TABLET TWICE DAILY WITH MEALS    mometasone-formoterol (DULERA) 100-5 MCG/ACT AERO Inhale 2 puffs into the lungs 2 (two) times daily at 10 AM and 5 PM.    mupirocin ointment (BACTROBAN) 2 % Apply 1 application topically daily. Qd to excision site    nitroGLYCERIN (NITROSTAT) 0.4 MG SL tablet Place 1 tablet (0.4 mg total) under the tongue every 5 (five) minutes as needed. 06/12/2017: Reports has not needed recently    ONETOUCH ULTRA test strip USE 1 STRIP TO CHECK GLUCOSE ONCE DAILY    pravastatin (PRAVACHOL) 20 MG tablet TAKE 1 TABLET EVERY DAY    tiotropium (SPIRIVA HANDIHALER) 18 MCG inhalation capsule Place 1 capsule (18  mcg total) into inhaler and inhale daily.    No facility-administered encounter medications on file as of 08/29/2021.   Care Gaps: Diabetic Eye Exam Zoster Vaccines COVID-19 Vaccine Booster 3  Star Rating Drugs: Metformin 1000 mg last filled on 08/09/2021 for a 90-Day supply with [facility]  Pravastatin 20 mg last filled on 08/09/2021 for a 90-Day supply with [facility]  Glimepiride 1 mg last filled on 08/03/2021 for a 90-Day supply with [facility] 25 mg patient receives this medication through patient assistance with [facility]  Recent Relevant Labs: Lab Results  Component Value Date/Time   HGBA1C 8.3 (H) 05/09/2021 10:25 AM   HGBA1C 8.8 (H) 12/02/2020 08:04 AM   MICROALBUR negative 01/10/2018 03:02 PM   MICROALBUR 20 01/25/2017 01:55 PM    Kidney Function Lab Results  Component Value Date/Time   CREATININE 1.06 05/09/2021 10:25 AM   CREATININE 0.97 12/02/2020 08:04 AM   CREATININE 0.92 02/06/2017 09:40 AM   GFRNONAA 67 03/21/2020 09:52 AM   GFRNONAA 91 02/06/2017 09:40 AM   GFRAA 78 03/21/2020 09:52 AM   GFRAA 105 02/06/2017 09:40 AM    Current antihyperglycemic regimen:  Glimepiride 1 mg daily before breakfast  Jardiance 25 mg daily  Metformin 1000 mg twice daily  What recent interventions/DTPs have been made to improve glycemic control:  None ID  Have there been any recent hospitalizations or ED visits since last visit with CPP? No  06/06 LVM requesting patient to return  my call 06/07 LVM requesting patient to return my call 06/08 LVM requesting patient to return my call  I have attempted 3 separate times to contact the patient to complete his monthly call. I have left HIPAA compliant voicemails requesting a return call from the patient. My attempts have been unsuccessful at this time.  Patient needs a follow-up with CPP  [name], CPA/CMA Clinical Pharmacist Assistant Phone: [phone]

## 2021-10-10 NOTE — Progress Notes (Unsigned)
I,Sha'taria Tyson,acting as a Education administrator for [employer], PA-C.,have documented all relevant documentation on the behalf of [name], PA-C,as directed by  [name], PA-C while in the presence of [name], PA-C.   Established patient visit   Patient: Patrick Grant   DOB: Mar 03, 1956   66 y.o. Male  MRN: [id number] Visit Date: 10/11/2021  Today's healthcare provider: [name], PA-C   Cc. DM, HTN f/u  Subjective    HPI  Diabetes Mellitus Type II, Follow-up  Lab Results  Component Value Date   HGBA1C 8.3 (H) 05/09/2021   HGBA1C 8.8 (H) 12/02/2020   HGBA1C 8.4 (H) 03/21/2020   Wt Readings from Last 3 Encounters:  10/11/21 221 lb 11.2 oz (100.6 kg)  08/23/21 218 lb (98.9 kg)  08/08/21 218 lb 11.2 oz (99.2 kg)   Last seen for diabetes 5 months ago.  Management since then includes none. He reports excellent compliance with treatment. He is not having side effects.  Symptoms: No fatigue No foot ulcerations  No appetite changes No nausea  No paresthesia of the feet  No polydipsia  No polyuria No visual disturbances   No vomiting     Home blood sugar records: fasting range: 140-150  Episodes of hypoglycemia? No    Current insulin regiment: none Most Recent Eye Exam: Needs to update Current exercise: walking Current diet habits: well balanced  Pertinent Labs: Lab Results  Component Value Date   CHOL 171 12/02/2020   HDL 46 12/02/2020   LDLCALC 101 (H) 12/02/2020   TRIG 134 12/02/2020   CHOLHDL 3.7 12/02/2020   Lab Results  Component Value Date   NA 137 05/09/2021   K 4.8 05/09/2021   CREATININE 1.06 05/09/2021   EGFR 79 05/09/2021   MICROALBUR negative 01/10/2018   LABMICR See below: 04/20/2016     ---------------------------------------------------------------------------------------------------  Hypertension, follow-up  BP Readings from Last 3 Encounters:  10/11/21 133/90  08/08/21 113/81  05/09/21 (!) 126/95   Wt  Readings from Last 3 Encounters:  10/11/21 221 lb 11.2 oz (100.6 kg)  08/23/21 218 lb (98.9 kg)  08/08/21 218 lb 11.2 oz (99.2 kg)     He was last seen for hypertension 5 months ago.  BP at that visit was 126/95. Management since that visit includes check 2-3 times a week at home and record on paper, bring in to next visit to discuss.  He reports excellent compliance with treatment. He is not having side effects.  He is following a Regular diet. He is exercising. He does not smoke.  Use of agents associated with hypertension: none.   Outside blood pressures are not being checked Symptoms: No chest pain No chest pressure  No palpitations No syncope  No dyspnea No orthopnea  No paroxysmal nocturnal dyspnea Yes lower extremity edema    Pertinent labs Lab Results  Component Value Date   CHOL 171 12/02/2020   HDL 46 12/02/2020   LDLCALC 101 (H) 12/02/2020   TRIG 134 12/02/2020   CHOLHDL 3.7 12/02/2020   Lab Results  Component Value Date   NA 137 05/09/2021   K 4.8 05/09/2021   CREATININE 1.06 05/09/2021   EGFR 79 05/09/2021   GLUCOSE 154 (H) 05/09/2021   TSH 1.170 12/02/2020     The 10-year ASCVD risk score (Arnett DK, et al., 2019) is: 23%  ---------------------------------------------------------------------------------------------------   Medications: Outpatient Medications Prior to Visit  Medication Sig   albuterol (VENTOLIN HFA) 108 (90 Base) MCG/ACT inhaler Inhale 2  puffs into the lungs every 6 (six) hours as needed for wheezing or shortness of breath.   empagliflozin (JARDIANCE) 25 MG TABS tablet Take 1 tablet (25 mg total) by mouth daily.   FLUZONE QUADRIVALENT 0.5 ML injection    hydrocortisone 2.5 % lotion Apply topically daily. Tuesday, Thursday, Saturday   ketoconazole (NIZORAL) 2 % cream Apply 1 application topically daily. Monday, Wednesday, Friday   lisinopril (ZESTRIL) 10 MG tablet TAKE 1 TABLET EVERY DAY   metFORMIN (GLUCOPHAGE) 1000 MG tablet TAKE  1 TABLET TWICE DAILY WITH MEALS   mometasone-formoterol (DULERA) 100-5 MCG/ACT AERO Inhale 2 puffs into the lungs 2 (two) times daily at 10 AM and 5 PM.   mupirocin ointment (BACTROBAN) 2 % Apply 1 application topically daily. Qd to excision site   nitroGLYCERIN (NITROSTAT) 0.4 MG SL tablet Place 1 tablet (0.4 mg total) under the tongue every 5 (five) minutes as needed.   ONETOUCH ULTRA test strip USE 1 STRIP TO CHECK GLUCOSE ONCE DAILY   pravastatin (PRAVACHOL) 20 MG tablet TAKE 1 TABLET EVERY DAY   [DISCONTINUED] glimepiride (AMARYL) 1 MG tablet TAKE 1 TABLET EVERY DAY WITH BREAKFAST   tiotropium (SPIRIVA HANDIHALER) 18 MCG inhalation capsule Place 1 capsule (18 mcg total) into inhaler and inhale daily.   No facility-administered medications prior to visit.    Review of Systems  Constitutional:  Negative for fatigue and fever.  Respiratory:  Negative for cough and shortness of breath.   Cardiovascular:  Negative for chest pain, palpitations and leg swelling.  Neurological:  Negative for dizziness and headaches.       Objective    Blood pressure 133/90, pulse 87, height 6' (1.829 m), weight 221 lb 11.2 oz (100.6 kg), SpO2 100 %.   Physical Exam Constitutional:      General: He is awake.     Appearance: He is well-developed.  HENT:     Head: Normocephalic.  Eyes:     Conjunctiva/sclera: Conjunctivae normal.  Cardiovascular:     Rate and Rhythm: Normal rate and regular rhythm.     Heart sounds: Normal heart sounds.  Pulmonary:     Effort: Pulmonary effort is normal.     Breath sounds: Normal breath sounds.  Skin:    General: Skin is warm.  Neurological:     Mental Status: He is alert and oriented to person, place, and time.  Psychiatric:        Attention and Perception: Attention normal.        Mood and Affect: Mood normal.        Speech: Speech normal.        Behavior: Behavior is cooperative.      No results found for any visits on 10/11/21.  Assessment & Plan      Problem List Items Addressed This Visit       Cardiovascular and Mediastinum   Essential (primary) hypertension    Managed with lisinopril 10 In appropriate range Reviewed last cmp F/u 6 mo         Endocrine   Type 2 diabetes mellitus ([place]) - Primary    Currently managed with jardiance 25 mg glimepiride 1 mg and metformin 1000 mg BID A1c today 7.3% from 8.3% Plan to increase glimepiride to 2 mg daily. Foot exam UTD uacr today Discussed need for optho On statin LDL goal < 70; not there yet will check fasting labs next visit F/u 3 mo       Relevant Medications   glimepiride (AMARYL)  2 MG tablet   Other Relevant Orders   POCT glycosylated hemoglobin (Hb A1C)   Urine Microalbumin w/creat. ratio     Return in about 3 months (around 01/11/2022) for DMII, hyperlipidemia.      I, [name], PA-C have reviewed all documentation for this visit. The documentation on  10/11/2021 for the exam, diagnosis, procedures, and orders are all accurate and complete.  [name], PA-C [facility] [id number] [place], [place], [address] Office: [phone] Fax: [phone]

## 2021-10-11 ENCOUNTER — Encounter: Payer: Self-pay | Admitting: Physician Assistant

## 2021-10-11 ENCOUNTER — Ambulatory Visit (INDEPENDENT_AMBULATORY_CARE_PROVIDER_SITE_OTHER): Payer: Medicare HMO | Admitting: Physician Assistant

## 2021-10-11 VITALS — BP 133/90 | HR 87 | Ht 72.0 in | Wt 221.7 lb

## 2021-10-11 DIAGNOSIS — I10 Essential (primary) hypertension: Secondary | ICD-10-CM | POA: Diagnosis not present

## 2021-10-11 DIAGNOSIS — E1100 Type 2 diabetes mellitus with hyperosmolarity without nonketotic hyperglycemic-hyperosmolar coma (NKHHC): Secondary | ICD-10-CM | POA: Diagnosis not present

## 2021-10-11 LAB — POCT GLYCOSYLATED HEMOGLOBIN (HGB A1C): Hemoglobin A1C: 7.3 % — AB (ref 4.0–5.6)

## 2021-10-11 MED ORDER — GLIMEPIRIDE 2 MG PO TABS: 2.0000 mg | ORAL_TABLET | Freq: Every day | ORAL | 1 refills | Status: DC

## 2021-10-11 NOTE — Assessment & Plan Note (Addendum)
Managed with lisinopril 10 In appropriate range Reviewed last cmp F/u 6 mo

## 2021-10-11 NOTE — Assessment & Plan Note (Addendum)
Currently managed with jardiance 25 mg glimepiride 1 mg and metformin 1000 mg BID A1c today 7.3% from 8.3% Plan to increase glimepiride to 2 mg daily. Foot exam UTD uacr today Discussed need for optho On statin LDL goal < 70; not there yet will check fasting labs next visit F/u 3 mo

## 2021-10-12 LAB — MICROALBUMIN / CREATININE URINE RATIO
Creatinine, Urine: 25 mg/dL
Microalb/Creat Ratio: <12 mg/g creat (ref 0–29)
Microalbumin, Urine: <3 ug/mL

## 2021-11-16 ENCOUNTER — Encounter: Payer: Medicare Other | Admitting: Dermatology

## 2021-11-28 ENCOUNTER — Telehealth: Payer: Self-pay

## 2021-11-28 NOTE — Progress Notes (Signed)
Chronic Care Management Pharmacy Assistant   Name: Patrick Grant  MRN: [id number] DOB: 1955-05-12  Reason for Encounter: Diabetes Disease State Call/Reschedule Cancelled appointment   Recent office visits:  10/11/2021 Patrick Kirschner, PA-C (PCP Office Visit) for Type II DM- Changed: Glimepiride 1 mg daily to 2 mg daily before breakfast, Lab orders placed, Patient to follow-up in 3 months  Recent consult visits:  None ID  Hospital visits:  None in previous 6 months  Medications: Outpatient Encounter Medications as of 11/28/2021  Medication Sig Note   albuterol (VENTOLIN HFA) 108 (90 Base) MCG/ACT inhaler Inhale 2 puffs into the lungs every 6 (six) hours as needed for wheezing or shortness of breath.    empagliflozin (JARDIANCE) 25 MG TABS tablet Take 1 tablet (25 mg total) by mouth daily.    FLUZONE QUADRIVALENT 0.5 ML injection     glimepiride (AMARYL) 2 MG tablet Take 1 tablet (2 mg total) by mouth daily before breakfast.    hydrocortisone 2.5 % lotion Apply topically daily. Tuesday, Thursday, Saturday    ketoconazole (NIZORAL) 2 % cream Apply 1 application topically daily. Monday, Wednesday, Friday    lisinopril (ZESTRIL) 10 MG tablet TAKE 1 TABLET EVERY DAY    metFORMIN (GLUCOPHAGE) 1000 MG tablet TAKE 1 TABLET TWICE DAILY WITH MEALS    mometasone-formoterol (DULERA) 100-5 MCG/ACT AERO Inhale 2 puffs into the lungs 2 (two) times daily at 10 AM and 5 PM.    mupirocin ointment (BACTROBAN) 2 % Apply 1 application topically daily. Qd to excision site    nitroGLYCERIN (NITROSTAT) 0.4 MG SL tablet Place 1 tablet (0.4 mg total) under the tongue every 5 (five) minutes as needed. 06/12/2017: Reports has not needed recently    ONETOUCH ULTRA test strip USE 1 STRIP TO CHECK GLUCOSE ONCE DAILY    pravastatin (PRAVACHOL) 20 MG tablet TAKE 1 TABLET EVERY DAY    tiotropium (SPIRIVA HANDIHALER) 18 MCG inhalation capsule Place 1 capsule (18 mcg total) into inhaler and inhale daily.    No  facility-administered encounter medications on file as of 11/28/2021.   Care Gaps: Diabetic Eye Exam- Per [facility] note patient has never had this done Zoster Vaccine Influenza Vaccine  Star Rating Drugs: Metformin 1000 mg last filled on 10/21/2021 for a 90-Day supply with [facility]  Pravastatin 20 mg last filled on 10/21/2021 for a 90-Day supply with [facility]  Glimepiride 2 mg last filled on 10/12/2021 for a 90-Day supply with [facility]  Jardiance 25 mg patient receives this medication through patient assistance with [facility] Lisinopril 10 mg last filled on 10/21/2021 for a 90-Day supply with [facility]: Lab Results  Component Value Date/Time   HGBA1C 7.3 (A) 10/11/2021 08:59 AM   HGBA1C 8.3 (H) 05/09/2021 10:25 AM   HGBA1C 8.8 (H) 12/02/2020 08:04 AM   MICROALBUR negative 01/10/2018 03:02 PM   MICROALBUR 20 01/25/2017 01:55 PM    Kidney Function Lab Results  Component Value Date/Time   CREATININE 1.06 05/09/2021 10:25 AM   CREATININE 0.97 12/02/2020 08:04 AM   CREATININE 0.92 02/06/2017 09:40 AM   GFRNONAA 67 03/21/2020 09:52 AM   GFRNONAA 91 02/06/2017 09:40 AM   GFRAA 78 03/21/2020 09:52 AM   GFRAA 105 02/06/2017 09:40 AM   Current antihyperglycemic regimen:  Glimepiride 2 mg daily before breakfast  Jardiance 25 mg daily  Metformin 1000 mg twice daily  What recent interventions/DTPs have been made to improve glycemic control:  PCP  recently increased Glimepiride to 2 mg daily  Have there been any recent hospitalizations or ED visits since last visit with CPP? No  Patient denies hypoglycemic symptoms, including Pale, Sweaty, Shaky, Hungry, Nervous/irritable, and Vision changes Patient denies hyperglycemic symptoms, including blurry vision, excessive thirst, fatigue, polyuria, and weakness  How often are you checking your blood sugar? once daily What are your blood sugars ranging?  Fasting: 111-115  During the  week, how often does your blood glucose drop below 70? Never Are you checking your feet daily/regularly? Yes  Adherence Review: Is the patient currently on a STATIN medication? Yes Is the patient currently on ACE/ARB medication? Yes Does the patient have >5 day gap between last estimated fill dates? No  I spoke with the patient, and he reports that for the most part he is doing well. Patient does report that about 5 days ago he fell. Per patient he was taking a step, and his left leg gave out on him. He reports that he did hit his shoulder, back, and side of his face. Patient stated that he R shoulder and lower back are still sore. His bruises are going away. Per patient he doesn't feel like he broke anything, and he is just sore. I did offer to contact PCP's office to have a nurse follow-up, but patient declined. I did inquire if patient feels like anything is broken but patient denies. Patient stated he is feeling better today than he did when it first happened. Patient has a wood floor so the fall was not cushioned so he feels like that is contributing to his soreness.  Patient is taking the increase in Glimepiride, and he reports that he still has some 1 mg tablets left so he is taking 1 mg prior to his 1st meal of the day and 1 mg prior to dinner. Patient reports taking the medication like this seems to be working. Patient stated he is trying to complete his 1 mg tablets prior to starting the 2 mg tablet. He does like taking 1 twice daily vs 1 once daily. I informed him the new prescription is one 2 mg tablet prior to breakfast. Patient advised he really doesn't eat breakfast so I informed him to just take it prior to his first meal of the day.   Patient reports that he is trying to watch his sugar and carb intake. Patient states that he still does eat rice and pasta, but has cut back on eating as much as he did in the past. Per patient he grew up on these types of foods so he is doing the best he  can. Patient reports that he has lost some weight, and his blood sugar numbers have been doing much better. Patient reports that his A1C has decreased which he is very proud of. Patient has no other concerns or issues today. Patient did agree to follow-up with CPP. Patient scheduled for 12/18/2021 @ 0830.    [name], CPA/CMA Clinical Pharmacist Assistant Phone: [phone]

## 2021-12-18 ENCOUNTER — Ambulatory Visit (INDEPENDENT_AMBULATORY_CARE_PROVIDER_SITE_OTHER): Payer: Medicare HMO

## 2021-12-18 DIAGNOSIS — E1100 Type 2 diabetes mellitus with hyperosmolarity without nonketotic hyperglycemic-hyperosmolar coma (NKHHC): Secondary | ICD-10-CM

## 2021-12-18 DIAGNOSIS — J439 Emphysema, unspecified: Secondary | ICD-10-CM

## 2021-12-18 DIAGNOSIS — E782 Mixed hyperlipidemia: Secondary | ICD-10-CM

## 2021-12-18 NOTE — Patient Instructions (Signed)
Visit Information It was great speaking with you today!  Please let me know if you have any questions about our visit.   Goals Addressed             This Visit's Progress    Monitor and Manage My Blood Sugar-Diabetes Type 2   On track    Timeframe:  Long-Range Goal Priority:  High Start Date: 11/11/2020                             Expected End Date: 11/11/2021                       Follow Up within 90 days    - check blood sugar at prescribed times - check blood sugar if I feel it is too high or too low - enter blood sugar readings and medication or insulin into daily log - take the blood sugar log to all doctor visits    Why is this important?   Checking your blood sugar at home helps to keep it from getting very high or very low.  Writing the results in a diary or log helps the doctor know how to care for you.  Your blood sugar log should have the time, date and the results.  Also, write down the amount of insulin or other medicine that you take.  Other information, like what you ate, exercise done and how you were feeling, will also be helpful.     Notes:         Patient Care Plan: General Pharmacy (Adult)     Problem Identified: Hypertension, Hyperlipidemia, Diabetes, and Pulmonary Emphysema   Priority: High     Long-Range Goal: Patient-Specific Goal   Start Date: 11/11/2020  Expected End Date: 12/19/2022  This Visit's Progress: On track  Recent Progress: On track  Priority: High  Note:   Current Barriers:  Unable to independently afford treatment regimen Unable to achieve control of diabetes  Suboptimal therapeutic regimen for cholesterol Unable to achieve control of blood pressure  Pharmacist Clinical Goal(s):  Patient will verbalize ability to afford treatment regimen achieve control of diabetes as evidenced by A1c less than 7% achieve control of blood pressure as evidenced by BP less than 130/80 through collaboration with PharmD and provider.    Interventions: 1:1 collaboration with [name], PA-C regarding development and update of comprehensive plan of care as evidenced by provider attestation and co-signature Inter-disciplinary care team collaboration (see longitudinal plan of care) Comprehensive medication review performed; medication list updated in electronic medical record  Hypertension (BP goal <130/80) -Controlled -Current treatment: Lisinopril 10 mg daily  -Medications previously tried: NA  -Current home readings: Not monitoring regularly  -Denies hypotensive/hypertensive symptoms -Counseled to monitor BP at home weekly, document, and provide log at future appointments  Hyperlipidemia: (LDL goal < 70) -Uncontrolled -Current treatment: Pravastatin 20 mg daily  -Medications previously tried: NA  -Given high risk of ASCVD, patient is a candidate for high intensity statin. -Recommend rechecking FLP -Recommend stopping pravastatin, starting atorvastatin 40 mg daily   Diabetes (A1c goal <7%) -Uncontrolled, but improving.  -Current medications: Glimepiride 2 mg daily before breakfast  Jardiance 25 mg daily  Metformin 1000 mg twice daily -Medications previously tried: Invokana, glipizide,   -Current home glucose readings: 110,111 -Denies hypoglycemic symptoms -Focusing on smaller portions for   COPD (Goal: control symptoms and prevent exacerbations) -Controlled -Current treatment  Albuterol  Spiriva  1 daily  -Medications previously tried: NA  -Exacerbations requiring treatment in last 6 months: None -Patient reports consistent use of maintenance inhaler, although he has stopped his Dulera  -Recommended to continue current medication   Patient Goals/Self-Care Activities Patient will:  - check glucose daily, before meals, document, and provide at future appointments check blood pressure weekly, document, and provide at future appointments  Follow Up Plan: Telephone follow up appointment with care  management team member scheduled for:  06/17/21 at 8:30 AM      Patient agreed to services and verbal consent obtained.   Patient verbalizes understanding of instructions and care plan provided today and agrees to view in [place]. Active MyChart status and patient understanding of how to access instructions and care plan via MyChart confirmed with patient.     [name], PharmD, Para March, CPP  Clinical Pharmacist Practitioner  [facility] [phone]

## 2021-12-18 NOTE — Progress Notes (Signed)
Chronic Care Management Pharmacy Note  12/18/2021 Name:  Patrick Grant MRN:  [id number] DOB:  04/19/1955  Summary: Patient presents for CCM follow-up.   -Patient discontinued his Dulera.  - Patient reports a fall beginning of September due to leg weakness.  -Given high risk of ASCVD, patient is a candidate for high intensity statin.  Recommendations/Changes made from today's visit: -Start renewal PAP for Vania Rea and Spiriva   At PCP follow-up:  -Recommend rechecking FLP -Recommend stopping pravastatin, starting atorvastatin 40 mg daily   Plan: CPP follow-up 6 months    Subjective: Patrick Grant is an 66 y.o. year old male who is a primary patient of Lelon Huh MD.  The CCM team was consulted for assistance with disease management and care coordination needs.    Engaged with patient by telephone for follow up visit in response to provider referral for pharmacy case management and/or care coordination services.   Consent to Services:  The patient was given information about Chronic Care Management services, agreed to services, and gave verbal consent prior to initiation of services.  Please see initial visit note for detailed documentation.   Patient Care Team: [name], Hershal Coria as PCP - General (Physician Assistant) Allyne Gee, MD as Consulting Physician (Internal Medicine) Pa, [facility]) Jonathon Bellows, MD as Consulting Physician (Gastroenterology) Germaine Pomfret, [facility] as Pharmacist (Pharmacist)  Recent office visits: 10/11/21: Patient presented to [name], PA-C for follow-up. A1c 7.3%. Glimepiride 2 mg daily.  08/08/21: Patient presetned to [name], PA-C for viral URI.   Recent consult visits: None in previous 6 months  Hospital visits: None in previous 6 months   Objective:  Lab Results  Component Value Date   CREATININE 1.06 05/09/2021   BUN 17 05/09/2021   GFRNONAA 67 03/21/2020   GFRAA 78 03/21/2020    NA 137 05/09/2021   K 4.8 05/09/2021   CALCIUM 9.9 05/09/2021   CO2 20 05/09/2021   GLUCOSE 154 (H) 05/09/2021    Lab Results  Component Value Date/Time   HGBA1C 7.3 (A) 10/11/2021 08:59 AM   HGBA1C 8.3 (H) 05/09/2021 10:25 AM   HGBA1C 8.8 (H) 12/02/2020 08:04 AM   MICROALBUR negative 01/10/2018 03:02 PM   MICROALBUR 20 01/25/2017 01:55 PM    Last diabetic Eye exam: No results found for: "HMDIABEYEEXA"  Last diabetic Foot exam: No results found for: "HMDIABFOOTEX"   Lab Results  Component Value Date   CHOL 171 12/02/2020   HDL 46 12/02/2020   LDLCALC 101 (H) 12/02/2020   TRIG 134 12/02/2020   CHOLHDL 3.7 12/02/2020       Latest Ref Rng & Units 05/09/2021   10:25 AM 12/02/2020    8:04 AM 03/21/2020    9:52 AM  Hepatic Function  Total Protein 6.0 - 8.5 g/dL 7.3  6.8  7.7   Albumin 3.8 - 4.8 g/dL 5.2  4.9  5.1   AST 0 - 40 IU/L 17  11  20    ALT 0 - 44 IU/L 20  20  24    Alk Phosphatase 44 - 121 IU/L 99  102  96   Total Bilirubin 0.0 - 1.2 mg/dL 0.4  0.5  0.5     Lab Results  Component Value Date/Time   TSH 1.170 12/02/2020 08:04 AM   TSH 1.420 03/21/2020 09:52 AM       Latest Ref Rng & Units 12/02/2020    8:04 AM 03/21/2020    9:52 AM 12/15/2019  11:55 AM  CBC  WBC 3.4 - 10.8 x10E3/uL 8.3  11.0  9.1   Hemoglobin 13.0 - 17.7 g/dL 14.9  16.4  15.8   Hematocrit 37.5 - 51.0 % 44.4  48.5  46.3   Platelets 150 - 450 x10E3/uL 334  377  354     No results found for: "VD25OH"  Clinical ASCVD: No  The 10-year ASCVD risk score (Arnett DK, et al., 2019) is: 24.8%   Values used to calculate the score:     Age: 66 years     Sex: Male     Is Non-Hispanic African American: No     Diabetic: Yes     Tobacco smoker: No     Systolic Blood Pressure: 035 mmHg     Is BP treated: Yes     HDL Cholesterol: 46 mg/dL     Total Cholesterol: 171 mg/dL       08/23/2021    1:52 PM 05/09/2021    9:27 AM 12/01/2020    2:28 PM  Depression screen PHQ 2/9  Decreased Interest 0 0 0   Down, Depressed, Hopeless 0 0 0  PHQ - 2 Score 0 0 0  Altered sleeping  0 1  Tired, decreased energy  1 1  Change in appetite  0 0  Feeling bad or failure about yourself   0 0  Trouble concentrating  0 0  Moving slowly or fidgety/restless  0 0  Suicidal thoughts  0 0  PHQ-9 Score  1 2  Difficult doing work/chores  Not difficult at all Not difficult at all    Social History   Tobacco Use  Smoking Status Former   Types: Cigars   Quit date: 03/12/2016   Years since quitting: 5.7  Smokeless Tobacco Never   BP Readings from Last 3 Encounters:  10/11/21 133/90  08/08/21 113/81  05/09/21 (!) 126/95   Pulse Readings from Last 3 Encounters:  10/11/21 87  08/08/21 (!) 108  05/09/21 96   Wt Readings from Last 3 Encounters:  10/11/21 221 lb 11.2 oz (100.6 kg)  08/23/21 218 lb (98.9 kg)  08/08/21 218 lb 11.2 oz (99.2 kg)   BMI Readings from Last 3 Encounters:  10/11/21 30.07 kg/m  08/23/21 28.76 kg/m  08/08/21 28.85 kg/m    Assessment/Interventions: Review of patient past medical history, allergies, medications, health status, including review of consultants reports, laboratory and other test data, was performed as part of comprehensive evaluation and provision of chronic care management services.   SDOH:  (Social Determinants of Health) assessments and interventions performed: Yes SDOH Interventions    Flowsheet Row Clinical Support from 08/23/2021 in [place] Management from 02/06/2021 in [place] Management from 01/13/2021 in [place] Management from 11/11/2020 in [place] from 02/23/2020 in [place] Management from 11/23/2019 in [place] Interventions Intervention Not Indicated -- -- -- -- --  Housing Interventions Intervention Not Indicated -- -- -- -- --   Transportation Interventions Patient Resources Tax adviser) -- -- -- -- --  Financial Strain Interventions Intervention Not Indicated, Patient Refused Other (Comment)  [PAP] Intervention Not Indicated Intervention Not Indicated Other (Comment)  [Pt is in contact with the [place] team and is getting assistance with medications.] Other (Comment)  [PAP]  Physical Activity Interventions Intervention Not Indicated -- -- -- Patient Refused --  Stress Interventions Intervention Not Indicated -- -- -- Other (Comment)  [Due to pain.] --  Social Connections Interventions Intervention Not Indicated -- -- -- -- --        SDOH Screenings   Food Insecurity: No Food Insecurity (08/23/2021)  Housing: Low Risk  (08/23/2021)  Transportation Needs: No Transportation Needs (08/23/2021)  Alcohol Screen: Low Risk  (08/23/2021)  Depression (PHQ2-9): Low Risk  (08/23/2021)  Financial Resource Strain: Low Risk  (08/23/2021)  Physical Activity: Insufficiently Active (08/23/2021)  Social Connections: Socially Isolated (08/23/2021)  Stress: No Stress Concern Present (08/23/2021)  Tobacco Use: Medium Risk (10/11/2021)    CCM Care Plan  Allergies  Allergen Reactions   Almond (Diagnostic) Swelling    throat   Fentanyl Other (See Comments)    Other reaction(s): Jittery   Clove Flavor Other (See Comments)    Loss of vision for 1-2 hrs    Medications Reviewed Today     Reviewed by [name], PA-C (Physician Assistant Certified) on 48[birth year] at [phone]  Med List Status: <None>   Medication Order Taking? Sig Documenting Provider Last Dose Status Informant  albuterol (VENTOLIN HFA) 108 (90 Base) MCG/ACT inhaler [id number] Yes Inhale 2 puffs into the lungs every 6 (six) hours as needed for wheezing or shortness of breath. Chrismon, Vickki Muff, PA-C Taking Active   empagliflozin (JARDIANCE) 25 MG TABS tablet [id number] Yes Take 1 tablet (25 mg total) by mouth daily. [name], PA-C Taking Active   FLUZONE  QUADRIVALENT 0.5 ML injection [id number] Yes  [provider] Taking Active   glimepiride (AMARYL) 2 MG tablet [id number] Yes Take 1 tablet (2 mg total) by mouth daily before breakfast. [name], PA-C  Active   hydrocortisone 2.5 % lotion [id number] Yes Apply topically daily. Tuesday, Thursday, Saturday Ralene Bathe, MD Taking Active   ketoconazole (NIZORAL) 2 % cream [id number] Yes Apply 1 application topically daily. Monday, Wednesday, Friday Ralene Bathe, MD Taking Active   lisinopril (ZESTRIL) 10 MG tablet [id number] Yes TAKE 1 TABLET EVERY DAY [name], PA-C Taking Active   metFORMIN (GLUCOPHAGE) 1000 MG tablet [id number] Yes TAKE 1 TABLET TWICE DAILY WITH MEALS Drubel, Ria Comment, PA-C Taking Active   mometasone-formoterol [facility]) 100-5 MCG/ACT Hollie Salk [id number] Yes Inhale 2 puffs into the lungs 2 (two) times daily at 10 AM and 5 PM. Chrismon, Vickki Muff, PA-C Taking Active   mupirocin ointment (BACTROBAN) 2 % [id number] Yes Apply 1 application topically daily. Qd to excision site Ralene Bathe, MD Taking Active   nitroGLYCERIN (NITROSTAT) 0.4 MG SL tablet [id number] Yes Place 1 tablet (0.4 mg total) under the tongue every 5 (five) minutes as needed. Wende Bushy, MD Taking Active            Med Note Marzetta Merino Jun 12, 2017 10:09 AM) Reports has not needed recently   [facility] ULTRA test strip [id number] Yes USE 1 STRIP TO CHECK GLUCOSE ONCE DAILY Birdie Sons, MD Taking Active   pravastatin (PRAVACHOL) 20 MG tablet [id number] Yes TAKE 1 TABLET EVERY DAY [name], PA-C Taking Active   tiotropium (SPIRIVA HANDIHALER) 18 MCG inhalation capsule [id number]  Place 1 capsule (18 mcg total) into inhaler and inhale daily. Tania Ade  Expired 09/27/21 2359             Patient Active Problem List   Diagnosis Date Noted   Dizziness 09/02/2017   Pulmonary emphysema ([place]) 07/05/2017   Prostate cancer screening 04/19/2016   Cervical pain  02/08/2015   Cervical nerve root disorder 02/08/2015   CRPS (complex regional pain syndrome), lower limb 02/08/2015   Bleeding from the nose 02/08/2015   Reflex sympathetic dystrophy of lower extremity 02/08/2015   Type 2 diabetes mellitus ([place]) 02/08/2015   Combined fat and carbohydrate induced hyperlipemia 04/06/2008   Elevated fasting blood sugar 10/10/2007   Essential (primary) hypertension 09/30/2007    Immunization History  Administered Date(s) Administered   Influenza,inj,Quad PF,6+ Mos 01/01/2014, 02/05/2015, 01/31/2016, 01/09/2017, 01/10/2018, 12/15/2019   Influenza-Unspecified 01/25/2021   PFIZER(Purple Top)SARS-COV-2 Vaccination 06/22/2019, 07/21/2019   Pneumococcal Polysaccharide-23 01/09/2017   Tdap 10/10/2020    Conditions to be addressed/monitored:  Hypertension, Hyperlipidemia, Diabetes, and Pulmonary Emphysema   Care Plan : General Pharmacy (Adult)  Updates made by Germaine Pomfret, RPH since 12/18/2021 12:00 AM     Problem: Hypertension, Hyperlipidemia, Diabetes, and Pulmonary Emphysema   Priority: High     Long-Range Goal: Patient-Specific Goal   Start Date: 11/11/2020  Expected End Date: 12/19/2022  This Visit's Progress: On track  Recent Progress: On track  Priority: High  Note:   Current Barriers:  Unable to independently afford treatment regimen Unable to achieve control of diabetes  Suboptimal therapeutic regimen for cholesterol Unable to achieve control of blood pressure  Pharmacist Clinical Goal(s):  Patient will verbalize ability to afford treatment regimen achieve control of diabetes as evidenced by A1c less than 7% achieve control of blood pressure as evidenced by BP less than 130/80 through collaboration with PharmD and provider.   Interventions: 1:1 collaboration with [name], PA-C regarding development and update of comprehensive plan of care as evidenced by provider attestation and co-signature Inter-disciplinary care team  collaboration (see longitudinal plan of care) Comprehensive medication review performed; medication list updated in electronic medical record  Hypertension (BP goal <130/80) -Controlled -Current treatment: Lisinopril 10 mg daily  -Medications previously tried: NA  -Current home readings: Not monitoring regularly  -Denies hypotensive/hypertensive symptoms -Counseled to monitor BP at home weekly, document, and provide log at future appointments  Hyperlipidemia: (LDL goal < 70) -Uncontrolled -Current treatment: Pravastatin 20 mg daily  -Medications previously tried: NA  -Given high risk of ASCVD, patient is a candidate for high intensity statin. -Recommend rechecking FLP -Recommend stopping pravastatin, starting atorvastatin 40 mg daily   Diabetes (A1c goal <7%) -Uncontrolled, but improving.  -Current medications: Glimepiride 2 mg daily before breakfast  Jardiance 25 mg daily  Metformin 1000 mg twice daily -Medications previously tried: Invokana, glipizide,   -Current home glucose readings: 110,111 -Denies hypoglycemic symptoms -Focusing on smaller portions for   COPD (Goal: control symptoms and prevent exacerbations) -Controlled -Current treatment  Albuterol  Spiriva 1 daily  -Medications previously tried: NA  -Exacerbations requiring treatment in last 6 months: None -Patient reports consistent use of maintenance inhaler, although he has stopped his Dulera  -Recommended to continue current medication   Patient Goals/Self-Care Activities Patient will:  - check glucose daily, before meals, document, and provide at future appointments check blood pressure weekly, document, and provide at future appointments  Follow Up Plan: Telephone follow up appointment with care management team member scheduled for:  06/17/21 at 8:30 AM    Medication Assistance:  Arlina Robes obtained through [facility] medication assistance program.  Enrollment ends Dec  2023  Compliance/Adherence/Medication fill history: Care Gaps: Ophthalmology Exam Zoster Vaccines PNA (last completed 10/17/20218) COVID-19 Vaccine Booster 3 Foot Exam (last completed 06/22/2019) Hemoglobin A1C (last completed 03/21/2020) Influenza Vaccine (last completed 12/15/2019)  Star-Rating Drugs: Lisinopril 10 mg  last filled on 03/09/2020 for a 90-Day supply no pharmacy indicated Jardiance 25 mg no fill history Metformin 1000 mg last filled on 03/17/2020 for a 90-Day supply no pharmacy indicated Pravastatin 20 mg last filled on 03/13/2020 for a 90-Day supply no pharmacy indicated  Patient's preferred pharmacy is:  [facility], [place] [address] Phone: [phone] Fax: [facility] [address], [place] - [place] [address] Phone: [phone] Fax: [phone]  [facility] - [address] [place] [address] Phone: [phone] Fax: [phone]  [facility] [place], [place] - [address] [place] [address] Phone: [phone] Fax: [phone]   Uses pill box? Yes Pt endorses 100% compliance  We discussed: Current pharmacy is preferred with insurance plan and patient is satisfied with pharmacy services Patient decided to: Continue current medication management strategy  Care Plan and Follow Up Patient Decision:  Patient agrees to Care Plan and Follow-up.  Plan: Telephone follow up appointment with care management team member scheduled for:  06/17/21 at 8:30 AM  [name], PharmD, [place], [place] [phone]

## 2021-12-23 DIAGNOSIS — E1159 Type 2 diabetes mellitus with other circulatory complications: Secondary | ICD-10-CM

## 2021-12-23 DIAGNOSIS — E785 Hyperlipidemia, unspecified: Secondary | ICD-10-CM

## 2021-12-23 DIAGNOSIS — J449 Chronic obstructive pulmonary disease, unspecified: Secondary | ICD-10-CM

## 2021-12-26 ENCOUNTER — Telehealth: Payer: Self-pay

## 2021-12-26 NOTE — Progress Notes (Signed)
    Chronic Care Management Pharmacy Assistant   Name: Patrick Grant  MRN: [id number] DOB: 07/01/1955  Reason for Encounter: 2024 Renewal PAP for Jardiance and Spiriva    Patient is currently on patient assistance through [facility] for his Jardiance and Spiriva. The renewal process for the 2024 year has started and the patient's renewal application can be submitted after 01/07/2022.  The renewal application process has been started and the renewal application will be mailed to the patient's home address for him to complete and return to [facility] along with his proof of income.  If patient has any questions he may contact me directly at [phone]. Medications: Outpatient Encounter Medications as of 12/26/2021  Medication Sig Note   albuterol (VENTOLIN HFA) 108 (90 Base) MCG/ACT inhaler Inhale 2 puffs into the lungs every 6 (six) hours as needed for wheezing or shortness of breath.    empagliflozin (JARDIANCE) 25 MG TABS tablet Take 1 tablet (25 mg total) by mouth daily.    FLUZONE QUADRIVALENT 0.5 ML injection     glimepiride (AMARYL) 2 MG tablet Take 1 tablet (2 mg total) by mouth daily before breakfast.    hydrocortisone 2.5 % lotion Apply topically daily. Tuesday, Thursday, Saturday    ketoconazole (NIZORAL) 2 % cream Apply 1 application topically daily. Monday, Wednesday, Friday    lisinopril (ZESTRIL) 10 MG tablet TAKE 1 TABLET EVERY DAY    metFORMIN (GLUCOPHAGE) 1000 MG tablet TAKE 1 TABLET TWICE DAILY WITH MEALS    mupirocin ointment (BACTROBAN) 2 % Apply 1 application topically daily. Qd to excision site    nitroGLYCERIN (NITROSTAT) 0.4 MG SL tablet Place 1 tablet (0.4 mg total) under the tongue every 5 (five) minutes as needed. 06/12/2017: Reports has not needed recently    ONETOUCH ULTRA test strip USE 1 STRIP TO CHECK GLUCOSE ONCE DAILY    pravastatin (PRAVACHOL) 20 MG tablet TAKE 1 TABLET EVERY DAY    tiotropium (SPIRIVA HANDIHALER) 18 MCG inhalation  capsule Place 1 capsule (18 mcg total) into inhaler and inhale daily.    No facility-administered encounter medications on file as of 12/26/2021.    [name], CPA/CMA Clinical Pharmacist Assistant Phone: [phone]

## 2022-01-11 ENCOUNTER — Ambulatory Visit: Payer: Medicare HMO | Admitting: Physician Assistant

## 2022-01-15 ENCOUNTER — Ambulatory Visit (INDEPENDENT_AMBULATORY_CARE_PROVIDER_SITE_OTHER): Payer: Medicare HMO | Admitting: Physician Assistant

## 2022-01-15 ENCOUNTER — Encounter: Payer: Self-pay | Admitting: Physician Assistant

## 2022-01-15 VITALS — BP 136/96 | HR 90 | Ht 73.0 in | Wt 205.0 lb

## 2022-01-15 DIAGNOSIS — E785 Hyperlipidemia, unspecified: Secondary | ICD-10-CM | POA: Diagnosis not present

## 2022-01-15 DIAGNOSIS — I152 Hypertension secondary to endocrine disorders: Secondary | ICD-10-CM

## 2022-01-15 DIAGNOSIS — G5772 Causalgia of left lower limb: Secondary | ICD-10-CM | POA: Diagnosis not present

## 2022-01-15 DIAGNOSIS — Z23 Encounter for immunization: Secondary | ICD-10-CM

## 2022-01-15 DIAGNOSIS — E1169 Type 2 diabetes mellitus with other specified complication: Secondary | ICD-10-CM

## 2022-01-15 DIAGNOSIS — E1159 Type 2 diabetes mellitus with other circulatory complications: Secondary | ICD-10-CM | POA: Diagnosis not present

## 2022-01-15 DIAGNOSIS — E1100 Type 2 diabetes mellitus with hyperosmolarity without nonketotic hyperglycemic-hyperosmolar coma (NKHHC): Secondary | ICD-10-CM | POA: Diagnosis not present

## 2022-01-15 DIAGNOSIS — Z125 Encounter for screening for malignant neoplasm of prostate: Secondary | ICD-10-CM | POA: Diagnosis not present

## 2022-01-15 LAB — POCT GLYCOSYLATED HEMOGLOBIN (HGB A1C): Hemoglobin A1C: 6.5 % — AB (ref 4.0–5.6)

## 2022-01-15 MED ORDER — GABAPENTIN 300 MG PO CAPS: 300.0000 mg | ORAL_CAPSULE | Freq: Three times a day (TID) | ORAL | 0 refills | Status: DC

## 2022-01-15 NOTE — Progress Notes (Signed)
I,Sha'taria Tyson,acting as a Education administrator for [employer], PA-C.,have documented all relevant documentation on the behalf of [name], PA-C,as directed by  [name], PA-C while in the presence of [name], PA-C.   Established patient visit   Patient: Patrick Grant   DOB: [birth year]   66 y.o. Male  MRN: [id number] Visit Date: 01/15/2022  Today's healthcare provider: [name], PA-C   Cc. DM, HTN, HLD f/u  Subjective    HPI  Diabetes Mellitus Type II, Follow-up  Lab Results  Component Value Date   HGBA1C 7.3 (A) 10/11/2021   HGBA1C 8.3 (H) 05/09/2021   HGBA1C 8.8 (H) 12/02/2020   Wt Readings from Last 3 Encounters:  01/15/22 205 lb (93 kg)  10/11/21 221 lb 11.2 oz (100.6 kg)  08/23/21 218 lb (98.9 kg)   Last seen for diabetes 3 months ago.  Management since then includes managed with jardiance 25 mg glimepiride 1 mg and metformin 1000 mg BID. He reports excellent compliance with treatment. He is not having side effects.  Symptoms: No fatigue No foot ulcerations  No appetite changes No nausea  No paresthesia of the feet  No polydipsia  No polyuria No visual disturbances   No vomiting     Home blood sugar records: fasting range: 100-120  Episodes of hypoglycemia? No  Current insulin regiment: none Most Recent Eye Exam: Need to update Current exercise: none Current diet habits: well balanced  Pertinent Labs: Lab Results  Component Value Date   CHOL 171 12/02/2020   HDL 46 12/02/2020   LDLCALC 101 (H) 12/02/2020   TRIG 134 12/02/2020   CHOLHDL 3.7 12/02/2020   Lab Results  Component Value Date   NA 137 05/09/2021   K 4.8 05/09/2021   CREATININE 1.06 05/09/2021   EGFR 79 05/09/2021   MICROALBUR negative 01/10/2018   LABMICR <3.0 10/11/2021     ---------------------------------------------------------------------------------------------------  Lipid/Cholesterol, Follow-up  Last lipid panel Other pertinent labs  Lab Results   Component Value Date   CHOL 171 12/02/2020   HDL 46 12/02/2020   LDLCALC 101 (H) 12/02/2020   TRIG 134 12/02/2020   CHOLHDL 3.7 12/02/2020   Lab Results  Component Value Date   ALT 20 05/09/2021   AST 17 05/09/2021   PLT 334 12/02/2020   TSH 1.170 12/02/2020     He was last seen for this 3 months ago.  Management since that visit includes continue current treatment.  He reports excellent compliance with treatment. He is not having side effects.  Symptoms: No chest pain No chest pressure/discomfort  No dyspnea No lower extremity edema  No numbness or tingling of extremity No orthopnea  No palpitations No paroxysmal nocturnal dyspnea  No speech difficulty No syncope   Current diet: well balanced Current exercise: none  The 10-year ASCVD risk score (Arnett DK, et al., 2019) is: 25.7%  ---------------------------------------------------------------------------------------------------   Medications: Outpatient Medications Prior to Visit  Medication Sig   albuterol (VENTOLIN HFA) 108 (90 Base) MCG/ACT inhaler Inhale 2 puffs into the lungs every 6 (six) hours as needed for wheezing or shortness of breath.   empagliflozin (JARDIANCE) 25 MG TABS tablet Take 1 tablet (25 mg total) by mouth daily.   FLUZONE QUADRIVALENT 0.5 ML injection    glimepiride (AMARYL) 2 MG tablet Take 1 tablet (2 mg total) by mouth daily before breakfast.   hydrocortisone 2.5 % lotion Apply topically daily. Tuesday, Thursday, Saturday   ketoconazole (NIZORAL) 2 % cream Apply 1 application topically  daily. Monday, Wednesday, Friday   lisinopril (ZESTRIL) 10 MG tablet TAKE 1 TABLET EVERY DAY   metFORMIN (GLUCOPHAGE) 1000 MG tablet TAKE 1 TABLET TWICE DAILY WITH MEALS   mupirocin ointment (BACTROBAN) 2 % Apply 1 application topically daily. Qd to excision site   nitroGLYCERIN (NITROSTAT) 0.4 MG SL tablet Place 1 tablet (0.4 mg total) under the tongue every 5 (five) minutes as needed.   ONETOUCH ULTRA test  strip USE 1 STRIP TO CHECK GLUCOSE ONCE DAILY   pravastatin (PRAVACHOL) 20 MG tablet TAKE 1 TABLET EVERY DAY   tiotropium (SPIRIVA HANDIHALER) 18 MCG inhalation capsule Place 1 capsule (18 mcg total) into inhaler and inhale daily.   No facility-administered medications prior to visit.    Review of Systems  Constitutional:  Negative for fatigue and fever.  Respiratory:  Negative for cough and shortness of breath.   Cardiovascular:  Negative for chest pain, palpitations and leg swelling.  Musculoskeletal:  Positive for arthralgias and myalgias.  Neurological:  Negative for dizziness and headaches.      Objective    Blood pressure (!) 136/96, pulse 90, height 6' 1"  (1.854 m), weight 205 lb (93 kg), SpO2 98 %.   Physical Exam Constitutional:      General: He is awake.     Appearance: He is well-developed.  HENT:     Head: Normocephalic.  Eyes:     Conjunctiva/sclera: Conjunctivae normal.  Cardiovascular:     Rate and Rhythm: Normal rate and regular rhythm.     Heart sounds: Normal heart sounds.  Pulmonary:     Effort: Pulmonary effort is normal. No respiratory distress.  Skin:    General: Skin is warm.  Neurological:     Mental Status: He is alert and oriented to person, place, and time.  Psychiatric:        Attention and Perception: Attention normal.        Mood and Affect: Mood normal.        Speech: Speech normal.        Behavior: Behavior is cooperative.     No results found for any visits on 01/15/22.  Assessment & Plan     Problem List Items Addressed This Visit       Cardiovascular and Mediastinum   Hypertension associated with diabetes ([place])    Elevated today, but pt in more pain than usual Managed with lisinopril 10 mg Ordered cmp F/u 4 mo         Endocrine   Hyperlipidemia associated with type 2 diabetes mellitus ([place])    Managed with pravastatin 20 mg Goal LDL < 70 Discussing adding ASA 81 mg daily The 10-year ASCVD risk score (Arnett DK, et  al., 2019) is: 25.7%        Relevant Orders   Lipid Profile   Comprehensive Metabolic Panel (CMET)   Type 2 diabetes mellitus ([facility]) - Primary    A1c today 6.5% compared to last visit 7.3%, now w/ appropriate control Managed with jardiance 25 mg glimepiride 2 mg and metformin 1000 mg BID Foot exam, uacr utd On statin On acei/arb F/u 4 mo       Relevant Orders   POCT HgB A1C     Nervous and Auditory   CRPS (complex regional pain syndrome), lower limb    Pt has a history of polypharmacy, on opioids.  Discussed during winter months when pain is worse, trying to control w/ only gabapentin rx gabapentin 300 mg to take QD -TID prn pain .  Relevant Medications   gabapentin (NEURONTIN) 300 MG capsule     Other   Prostate cancer screening   Relevant Orders   PSA   Other Visit Diagnoses     Need for immunization against influenza       Relevant Orders   Flu Vaccine QUAD 81moIM (Fluarix, Fluzone & Alfiuria Quad PF) (Completed)        Return in about 4 months (around 05/18/2022) for chronic conditions.      I, LMikey Kirschner PA-C have reviewed all documentation for this visit. The documentation on  01/15/2022  for the exam, diagnosis, procedures, and orders are all accurate and complete.  LMikey Kirschner PA-C [id number] [place] [address]Office: [phone]: [phone]

## 2022-01-15 NOTE — Assessment & Plan Note (Signed)
Pt has a history of polypharmacy, on opioids.  Discussed during winter months when pain is worse, trying to control w/ only gabapentin rx gabapentin 300 mg to take QD -TID prn pain .

## 2022-01-15 NOTE — Assessment & Plan Note (Signed)
Managed with pravastatin 20 mg Goal LDL < 70 Discussing adding ASA 81 mg daily The 10-year ASCVD risk score (Arnett DK, et al., 2019) is: 25.7%

## 2022-01-15 NOTE — Assessment & Plan Note (Signed)
A1c today 6.5% compared to last visit 7.3%, now w/ appropriate control Managed with jardiance 25 mg glimepiride 2 mg and metformin 1000 mg BID Foot exam, uacr utd On statin On acei/arb F/u 4 mo

## 2022-01-15 NOTE — Assessment & Plan Note (Signed)
Elevated today, but pt in more pain than usual Managed with lisinopril 10 mg Ordered cmp F/u 4 mo

## 2022-01-16 ENCOUNTER — Other Ambulatory Visit: Payer: Self-pay | Admitting: Physician Assistant

## 2022-01-16 DIAGNOSIS — E1169 Type 2 diabetes mellitus with other specified complication: Secondary | ICD-10-CM

## 2022-01-16 LAB — LIPID PANEL
Chol/HDL Ratio: 3.4 ratio (ref 0.0–5.0)
Cholesterol, Total: 140 mg/dL (ref 100–199)
HDL: 41 mg/dL (ref >39)
LDL Chol Calc (NIH): 83 mg/dL (ref 0–99)
Triglycerides: 81 mg/dL (ref 0–149)
VLDL Cholesterol Cal: 16 mg/dL (ref 5–40)

## 2022-01-16 LAB — COMPREHENSIVE METABOLIC PANEL
ALT: 13 IU/L (ref 0–44)
AST: 13 IU/L (ref 0–40)
Albumin/Globulin Ratio: 2 (ref 1.2–2.2)
Albumin: 4.5 g/dL (ref 3.9–4.9)
Alkaline Phosphatase: 80 IU/L (ref 44–121)
BUN/Creatinine Ratio: 13 (ref 10–24)
BUN: 12 mg/dL (ref 8–27)
Bilirubin Total: 0.4 mg/dL (ref 0.0–1.2)
CO2: 20 mmol/L (ref 20–29)
Calcium: 9.6 mg/dL (ref 8.6–10.2)
Chloride: 101 mmol/L (ref 96–106)
Creatinine, Ser: 0.93 mg/dL (ref 0.76–1.27)
Globulin, Total: 2.3 g/dL (ref 1.5–4.5)
Glucose: 103 mg/dL — ABNORMAL HIGH (ref 70–99)
Potassium: 4.9 mmol/L (ref 3.5–5.2)
Sodium: 139 mmol/L (ref 134–144)
Total Protein: 6.8 g/dL (ref 6.0–8.5)
eGFR: 92 mL/min/{1.73_m2} (ref >59)

## 2022-01-16 LAB — PSA: Prostate Specific Ag, Serum: 0.9 ng/mL (ref 0.0–4.0)

## 2022-01-16 MED ORDER — PRAVASTATIN SODIUM 40 MG PO TABS: 40.0000 mg | ORAL_TABLET | Freq: Every day | ORAL | 1 refills | Status: DC

## 2022-01-23 ENCOUNTER — Telehealth: Payer: Self-pay

## 2022-01-23 DIAGNOSIS — E785 Hyperlipidemia, unspecified: Secondary | ICD-10-CM

## 2022-01-23 MED ORDER — PRAVASTATIN SODIUM 40 MG PO TABS: 40.0000 mg | ORAL_TABLET | Freq: Every day | ORAL | 1 refills | Status: DC

## 2022-01-23 NOTE — Addendum Note (Signed)
Addended by: Barnie Mort on: 01/23/2022 03:53 PM   Modules accepted: Orders

## 2022-01-23 NOTE — Telephone Encounter (Signed)
Copied from [place] [id number]. Topic: General - Other >> Jan 23, 2022  1:22 PM Oley Balm E wrote: Reason for CRM: Pt called to report that his pravastatin was sent to the wrong pharmacy. He needs all of his medications sent via [facility] minus [place] and Spiriva.

## 2022-02-08 ENCOUNTER — Ambulatory Visit: Payer: Self-pay

## 2022-02-08 NOTE — Telephone Encounter (Signed)
  Chief Complaint: medication assistance  Symptoms: NA Frequency: today Pertinent Negatives: NA Disposition: '[]'$ ED /'[]'$ Urgent Care (no appt availability in office) / '[]'$ Appointment(In office/virtual)/ '[]'$  [facility]/ '[x]'$ Home Care/ '[]'$ Refused Recommended Disposition /'[]'$ [facility]/ '[]'$  Follow-up with PCP Additional Notes: advised pt that lab results indicated that Ria Comment wanted his dose increased from '20mg'$  to '40mg'$ . Pt states he has pu new dose and was unaware that he doesn't have access to Mychart d/t his Ipad is being worked on and dont want to access on another computer. Went over results from 01/15/22 and advised pt of dosage change. Pt verbalized understanding and didn't have any further questions.   Summary: Pt concerned because his Rx was doubled but he was not told why   Pt requests that a nurse return his call to explain why his Rx for pravastatin (PRAVACHOL) was doubled to 40 mg. Pt stated he was not told anything and he needs a nurse to return his call to discuss. Cb# [phone]         Reason for Disposition  Caller has medicine question only, adult not sick, AND triager answers question  Answer Assessment - Initial Assessment Questions 1. NAME of MEDICINE: "What medicine(s) are you calling about?"     Pravastatin  2. QUESTION: "What is your question?" (e.g., double dose of medicine, side effect)     Wanting know why dose changed and he wasn't told about it 3. PRESCRIBER: "Who prescribed the medicine?" Reason: if prescribed by specialist, call should be referred to that group.     Ria Comment, [place]  Protocols used: Medication Question Call-A-AH

## 2022-03-07 ENCOUNTER — Other Ambulatory Visit: Payer: Self-pay | Admitting: Physician Assistant

## 2022-03-07 DIAGNOSIS — E1100 Type 2 diabetes mellitus with hyperosmolarity without nonketotic hyperglycemic-hyperosmolar coma (NKHHC): Secondary | ICD-10-CM

## 2022-03-26 ENCOUNTER — Telehealth: Payer: Self-pay | Admitting: Physician Assistant

## 2022-03-26 DIAGNOSIS — G5772 Causalgia of left lower limb: Secondary | ICD-10-CM

## 2022-03-26 NOTE — Telephone Encounter (Signed)
Pharmacy requesting refill of gabapentin (NEURONTIN) 300 MG capsule

## 2022-03-28 ENCOUNTER — Telehealth: Payer: Self-pay

## 2022-03-28 MED ORDER — GABAPENTIN 300 MG PO CAPS: 300.0000 mg | ORAL_CAPSULE | Freq: Three times a day (TID) | ORAL | 1 refills | Status: DC

## 2022-03-28 NOTE — Progress Notes (Signed)
Care Coordination Pharmacy Assistant   Name: Patrick Grant  MRN: [id number] DOB: 1955/10/04  Reason for Encounter: Diabetes  Contacted patient on 03/28/2022 to discuss diabetes disease state.   Recent office visits:  01/15/2022 [name], PA-C (PCP Office Visit) for Follow-up- Started: Gabapentin 300 mg 3 times daily, Lab orders placed, Patient instructed to follow-up in 4 months  Recent consult visits:  None ID  Hospital visits:  None in previous 6 months  Medications: Outpatient Encounter Medications as of 03/28/2022  Medication Sig Note   albuterol (VENTOLIN HFA) 108 (90 Base) MCG/ACT inhaler Inhale 2 puffs into the lungs every 6 (six) hours as needed for wheezing or shortness of breath.    empagliflozin (JARDIANCE) 25 MG TABS tablet Take 1 tablet (25 mg total) by mouth daily.    FLUZONE QUADRIVALENT 0.5 ML injection     gabapentin (NEURONTIN) 300 MG capsule Take 1 capsule (300 mg total) by mouth 3 (three) times daily.    glimepiride (AMARYL) 2 MG tablet TAKE 1 TABLET (2 MG TOTAL) BY MOUTH DAILY BEFORE BREAKFAST.    hydrocortisone 2.5 % lotion Apply topically daily. Tuesday, Thursday, Saturday    ketoconazole (NIZORAL) 2 % cream Apply 1 application topically daily. Monday, Wednesday, Friday    lisinopril (ZESTRIL) 10 MG tablet TAKE 1 TABLET EVERY DAY    metFORMIN (GLUCOPHAGE) 1000 MG tablet TAKE 1 TABLET TWICE DAILY WITH MEALS    mupirocin ointment (BACTROBAN) 2 % Apply 1 application topically daily. Qd to excision site    nitroGLYCERIN (NITROSTAT) 0.4 MG SL tablet Place 1 tablet (0.4 mg total) under the tongue every 5 (five) minutes as needed. 06/12/2017: Reports has not needed recently    ONETOUCH ULTRA test strip USE 1 STRIP TO CHECK GLUCOSE ONCE DAILY    pravastatin (PRAVACHOL) 40 MG tablet Take 1 tablet (40 mg total) by mouth daily.    tiotropium (SPIRIVA HANDIHALER) 18 MCG inhalation capsule Place 1 capsule (18 mcg total) into inhaler and inhale daily.    No  facility-administered encounter medications on file as of 03/28/2022.   Recent Relevant Labs: Lab Results  Component Value Date/Time   HGBA1C 6.5 (A) 01/15/2022 10:54 AM   HGBA1C 7.3 (A) 10/11/2021 08:59 AM   HGBA1C 8.3 (H) 05/09/2021 10:25 AM   HGBA1C 8.8 (H) 12/02/2020 08:04 AM   MICROALBUR negative 01/10/2018 03:02 PM   MICROALBUR 20 01/25/2017 01:55 PM    Kidney Function Lab Results  Component Value Date/Time   CREATININE 0.93 01/15/2022 10:18 AM   CREATININE 1.06 05/09/2021 10:25 AM   CREATININE 0.92 02/06/2017 09:40 AM   GFRNONAA 67 03/21/2020 09:52 AM   GFRNONAA 91 02/06/2017 09:40 AM   GFRAA 78 03/21/2020 09:52 AM   GFRAA 105 02/06/2017 09:40 AM   Current antihyperglycemic regimen:  Glimepiride 2 mg daily before breakfast  Jardiance 25 mg daily  Metformin 1000 mg twice daily    Patient verbally confirms he is taking the above medications as directed. Yes  What diet changes have been made to improve diabetes control? Patient hasn't changed anything with his diet recently  What recent interventions/DTPs have been made to improve glycemic control:  None ID  Have there been any recent hospitalizations or ED visits since last visit with PharmD? No  Patient denies hypoglycemic symptoms, including Pale, Sweaty, Shaky, Hungry, Nervous/irritable, and Vision changes  Patient denies hyperglycemic symptoms, including blurry vision, excessive thirst, fatigue, polyuria, and weakness  How often are you checking your blood sugar? once daily  What are  your blood sugars ranging?  Fasting: 110-120  During the week, how often does your blood glucose drop below 70? Never  Are you checking your feet daily/regularly? Yes  Adherence Review: Is the patient currently on a STATIN medication? Yes Is the patient currently on ACE/ARB medication? Yes Does the patient have >5 day gap between last estimated fill dates? No  Star Rating Drugs:  Metformin 1000 mg last filled on 03/08/2022  for a 90-Day supply with [facility]  Pravastatin 20 mg last filled on 01/24/2022 for a 90-Day supply with [facility]  Glimepiride 2 mg last filled on 03/08/2022 for a 90-Day supply with [facility] 25 mg patient receives this medication through patient assistance with [facility] Lisinopril 10 mg last filled on 03/08/2022 for a 90-Day supply with [facility]: Annual wellness visit in last year? Yes Last eye exam / retinopathy screening: Patient hasn't had one Last diabetic foot exam: 05/09/2021  I spoke with the patient and he does complain of his left leg pain that has increased in pain X 2 weeks. Per patient he feels like he is being stabbed or a pin is sticking him constantly. Patient stated he is in so much pain that it is waking him up out of his sleep. He can't wear long pants, but is wearing shorts in order to not have anything rubbing against his leg.  Per patient his taking the Gabapentin 300 mg three times daily, but it's not helping him much although it does help some. Patient states he doesn't go outside due to the cold weather and the pain increasing. He is also using heat to try to reduce the pain that isn't helping him much either. Patient is now walking with 2 crutches around his home to keep his balance so he doesn't have a fall. Patient denies any recent falls. Patient also stated that his L foot is starting to swell again. Patient does elevate his foot as much as possible, and it does help with reduce his swelling some.  Patient instructed that I would contact PCP's Office to see if they can provide some assistance since he does not have a follow-up until next month.  Spoke with representative at the [employer] and provided information and requested a nurse contact the patient.  I spoke with [place] regarding the patient's renewal application for his his Jardiance/Spiriva and per the representative she stated that they never  received the application. CPP notified.  [name], CPA/CMA Clinical Pharmacist Assistant Phone: [phone]

## 2022-03-30 ENCOUNTER — Other Ambulatory Visit: Payer: Self-pay | Admitting: Physician Assistant

## 2022-03-30 DIAGNOSIS — G5772 Causalgia of left lower limb: Secondary | ICD-10-CM

## 2022-04-12 ENCOUNTER — Ambulatory Visit: Payer: Self-pay | Admitting: *Deleted

## 2022-04-12 NOTE — Telephone Encounter (Signed)
  Chief Complaint: left leg swelling- pain Symptoms: increased swelling foot/leg, pain in foot/leg Frequency: 1 month- worse pain last 2 weeks Pertinent Negatives: Patient denies chest pain, difficulty breathing Disposition: '[]'$ ED /'[]'$ Urgent Care (no appt availability in office) / '[x]'$ Appointment(In office/virtual)/ '[]'$  [facility]/ '[]'$ Home Care/ '[]'$ Refused Recommended Disposition /'[]'$ [facility]/ '[]'$  Follow-up with PCP Additional Notes: Patient does have transportation issues and he was able to schedule appointment next Tuesday- per request. Advised if he should get worse- ED

## 2022-04-12 NOTE — Telephone Encounter (Signed)
Summary: leg, left pain cant where pants,wakes him up at night 2 crutches so he doesnt fall, left swelling   [place], assistant to [place] called in states, patient has, left leg pain to where he can't where pants and the pain wakes him up at night , he uses 2 crutches so he doesnt fall, and left leg is swelled. Please call patient back.       Reason for Disposition  [1] MODERATE leg swelling (e.g., swelling extends up to knees) AND [2] new-onset or worsening  Answer Assessment - Initial Assessment Questions 1. ONSET: "When did the swelling start?" (e.g., minutes, hours, days)     1 month 2. LOCATION: "What part of the leg is swollen?"  "Are both legs swollen or just one leg?"     Left leg up to knee- starts outside of foot- toes painful 3. SEVERITY: "How bad is the swelling?" (e.g., localized; mild, moderate, severe)   - Localized: Small area of swelling localized to one leg.   - MILD pedal edema: Swelling limited to foot and ankle, pitting edema < 1/4 inch (6 mm) deep, rest and elevation eliminate most or all swelling.   - MODERATE edema: Swelling of lower leg to knee, pitting edema > 1/4 inch (6 mm) deep, rest and elevation only partially reduce swelling.   - SEVERE edema: Swelling extends above knee, facial or hand swelling present.      Intensity of pain- worse last 2 weeks- unable to sleep 4. REDNESS: "Does the swelling look red or infected?"     no 5. PAIN: "Is the swelling painful to touch?" If Yes, ask: "How painful is it?"   (Scale 1-10; mild, moderate or severe)     Severe- painful to touch- throbbing 6. FEVER: "Do you have a fever?" If Yes, ask: "What is it, how was it measured, and when did it start?"      no 7. CAUSE: "What do you think is causing the leg swelling?"     unsure 8. MEDICAL HISTORY: "Do you have a history of blood clots (e.g., DVT), cancer, heart failure, kidney disease, or liver failure?"     no 9. RECURRENT SYMPTOM: "Have you had leg swelling before?" If Yes,  ask: "When was the last time?" "What happened that time?"     no 10. OTHER SYMPTOMS: "Do you have any other symptoms?" (e.g., chest pain, difficulty breathing)       no  Protocols used: Leg Swelling and Edema-A-AH

## 2022-04-12 NOTE — Telephone Encounter (Signed)
Agree with dispo, OV and if worsens ED

## 2022-04-17 ENCOUNTER — Encounter: Payer: Self-pay | Admitting: Physician Assistant

## 2022-04-17 ENCOUNTER — Telehealth: Payer: Self-pay

## 2022-04-17 ENCOUNTER — Ambulatory Visit (INDEPENDENT_AMBULATORY_CARE_PROVIDER_SITE_OTHER): Payer: Medicare HMO | Admitting: Physician Assistant

## 2022-04-17 VITALS — BP 144/88 | HR 102 | Wt 209.1 lb

## 2022-04-17 DIAGNOSIS — J439 Emphysema, unspecified: Secondary | ICD-10-CM

## 2022-04-17 DIAGNOSIS — G5772 Causalgia of left lower limb: Secondary | ICD-10-CM | POA: Diagnosis not present

## 2022-04-17 DIAGNOSIS — M79672 Pain in left foot: Secondary | ICD-10-CM

## 2022-04-17 DIAGNOSIS — M7989 Other specified soft tissue disorders: Secondary | ICD-10-CM | POA: Diagnosis not present

## 2022-04-17 DIAGNOSIS — E1100 Type 2 diabetes mellitus with hyperosmolarity without nonketotic hyperglycemic-hyperosmolar coma (NKHHC): Secondary | ICD-10-CM

## 2022-04-17 NOTE — Telephone Encounter (Signed)
   Telephone encounter was:  Successful.  04/17/2022 Name: Patrick Grant MRN: [id number] DOB: [birth year]  Patrick Grant is a 67 y.o. year old male who is a primary care patient of [name], [place] . The community resource team was consulted for assistance with Transportation Needs   Care guide performed the following interventions: Patient provided with information about care guide support team and interviewed to confirm resource needs.Patient needs transportation resources. Patient also stated he thinks he has transportation benefits with [employer] in which I will follow up on Thursday around 10:00 to make some calls with the patient   Follow Up Plan:  Care guide will follow up with patient by phone over the next week   [place], [facility]  [phone] [address], [place], [place] [address] Phone: [phone] Email: [email or web address]

## 2022-04-17 NOTE — Progress Notes (Signed)
I,Sha'taria Tyson,acting as a Education administrator for [employer], PA-C.,have documented all relevant documentation on the behalf of [name], PA-C,as directed by  [name], PA-C while in the presence of [name], PA-C.   Established patient visit   Patient: Patrick Grant   DOB: 16-Sep-1955   67 y.o. Male  MRN: [id number] Visit Date: 04/17/2022  Today's healthcare provider: [name], PA-C   Cc, leg swelling and worsened pain  Subjective     Patient is a 67 year old male with CRPS in the left lower extremity who presents today with worsening edema and pain of his left foot that is extended up his left leg ongoing for the last month and worsened in the last week.  Reports last week he noticed some discoloration similar to bruising over the most painful area in his left foot.  Denies any numbness or other paresthesias.  Reports he often gets exacerbated by the cold.  Denies any history of gout.  Denies taking anything over-the-counter for pain. Denies injury or change in medication.  Medications: Outpatient Medications Prior to Visit  Medication Sig   albuterol (VENTOLIN HFA) 108 (90 Base) MCG/ACT inhaler Inhale 2 puffs into the lungs every 6 (six) hours as needed for wheezing or shortness of breath.   empagliflozin (JARDIANCE) 25 MG TABS tablet Take 1 tablet (25 mg total) by mouth daily.   FLUZONE QUADRIVALENT 0.5 ML injection    gabapentin (NEURONTIN) 300 MG capsule Take 1 capsule (300 mg total) by mouth 3 (three) times daily.   glimepiride (AMARYL) 2 MG tablet TAKE 1 TABLET (2 MG TOTAL) BY MOUTH DAILY BEFORE BREAKFAST.   hydrocortisone 2.5 % lotion Apply topically daily. Tuesday, Thursday, Saturday   ketoconazole (NIZORAL) 2 % cream Apply 1 application topically daily. Monday, Wednesday, Friday   lisinopril (ZESTRIL) 10 MG tablet TAKE 1 TABLET EVERY DAY   metFORMIN (GLUCOPHAGE) 1000 MG tablet TAKE 1 TABLET TWICE DAILY WITH MEALS   mupirocin ointment (BACTROBAN) 2 %  Apply 1 application topically daily. Qd to excision site   nitroGLYCERIN (NITROSTAT) 0.4 MG SL tablet Place 1 tablet (0.4 mg total) under the tongue every 5 (five) minutes as needed.   ONETOUCH ULTRA test strip USE 1 STRIP TO CHECK GLUCOSE ONCE DAILY   pravastatin (PRAVACHOL) 40 MG tablet Take 1 tablet (40 mg total) by mouth daily.   tiotropium (SPIRIVA HANDIHALER) 18 MCG inhalation capsule Place 1 capsule (18 mcg total) into inhaler and inhale daily.   No facility-administered medications prior to visit.    Review of Systems  Constitutional:  Negative for fatigue and fever.  Respiratory:  Negative for cough and shortness of breath.   Cardiovascular:  Negative for chest pain, palpitations and leg swelling.  Musculoskeletal:  Positive for arthralgias and joint swelling.  Skin:  Positive for color change.  Neurological:  Negative for dizziness and headaches.      Objective    Blood pressure (!) 144/88, pulse (!) 102, weight 209 lb 1.6 oz (94.8 kg), SpO2 100 %.   Physical Exam Vitals reviewed.  Constitutional:      Appearance: He is not ill-appearing.  HENT:     Head: Normocephalic.  Eyes:     Conjunctiva/sclera: Conjunctivae normal.  Cardiovascular:     Rate and Rhythm: Normal rate.  Pulmonary:     Effort: Pulmonary effort is normal. No respiratory distress.  Musculoskeletal:     Comments: Pt wearing flip flops Trace edema to L foot. No edema of left calf.  No rash or other change in discoloration. Difficult to palpate pedal pulses 2/2 pain. Acute tenderness to lateral L foot where the trace edema is located   Neurological:     General: No focal deficit present.     Mental Status: He is alert and oriented to person, place, and time.  Psychiatric:        Mood and Affect: Mood normal.        Behavior: Behavior normal.      No results found for any visits on 04/17/22.  Assessment & Plan     Left lower extremity pain, edema 2. CRPS Advised checking uric acid to r/o  gout  Ordered stat dvt r/o clot but lower suspicion given appearance today Likely CRPS flare, encouraged keeping extremities warm   Pt reports issues with transportation to imaging, office visits. Referred to social work for transportation help   I, [name], PA-C have reviewed all documentation for this visit. The documentation on  04/17/22 for the exam, diagnosis, procedures, and orders are all accurate and complete.  [name], PA-C [facility] [address] [place], [place], [address] Office: [phone] Fax: [place]

## 2022-04-18 ENCOUNTER — Ambulatory Visit
Admission: RE | Admit: 2022-04-18 | Discharge: 2022-04-18 | Disposition: A | Discharge status: Home / Self Care | Payer: Medicare HMO | Source: Ambulatory Visit | Attending: Physician Assistant | Admitting: Physician Assistant

## 2022-04-18 DIAGNOSIS — M79672 Pain in left foot: Secondary | ICD-10-CM | POA: Diagnosis not present

## 2022-04-18 DIAGNOSIS — M7989 Other specified soft tissue disorders: Secondary | ICD-10-CM | POA: Diagnosis not present

## 2022-04-18 DIAGNOSIS — M79662 Pain in left lower leg: Secondary | ICD-10-CM | POA: Diagnosis not present

## 2022-04-19 ENCOUNTER — Telehealth: Payer: Self-pay

## 2022-04-19 LAB — URIC ACID: Uric Acid: 5.2 mg/dL (ref 3.8–8.4)

## 2022-04-19 NOTE — Telephone Encounter (Signed)
   Telephone encounter was:  Successful.  04/19/2022 Name: Patrick Grant MRN: [id number] DOB: Jan 04, 1956  [name] is a 67 y.o. year old male who is a primary care patient of [name], [place] . The community resource team was consulted for assistance with Transportation Needs   Care guide performed the following interventions: Patient provided with information about care guide support team and interviewed to confirm resource needs.Follow up call to [facility] for transportation benefits. He has 24 rides 12 round trip Follow Up Plan:  No further follow up planned at this time. The patient has been provided with needed resources.    [place], Care Management  [phone] [address], [place], [place] [address] Phone: [phone] Email: [name]

## 2022-05-18 ENCOUNTER — Ambulatory Visit (INDEPENDENT_AMBULATORY_CARE_PROVIDER_SITE_OTHER): Payer: Medicare HMO | Admitting: Physician Assistant

## 2022-05-18 ENCOUNTER — Encounter: Payer: Self-pay | Admitting: Physician Assistant

## 2022-05-18 VITALS — BP 123/79 | HR 88 | Temp 98.0°F | Ht 73.0 in | Wt 202.0 lb

## 2022-05-18 DIAGNOSIS — E1159 Type 2 diabetes mellitus with other circulatory complications: Secondary | ICD-10-CM

## 2022-05-18 DIAGNOSIS — E1165 Type 2 diabetes mellitus with hyperglycemia: Secondary | ICD-10-CM

## 2022-05-18 DIAGNOSIS — I152 Hypertension secondary to endocrine disorders: Secondary | ICD-10-CM | POA: Diagnosis not present

## 2022-05-18 DIAGNOSIS — Z1211 Encounter for screening for malignant neoplasm of colon: Secondary | ICD-10-CM

## 2022-05-18 LAB — POCT GLYCOSYLATED HEMOGLOBIN (HGB A1C): Hemoglobin A1C: 6.6 % — AB (ref 4.0–5.6)

## 2022-05-18 NOTE — Progress Notes (Signed)
Established patient visit   Patient: Patrick Grant   DOB: [birth year]   67 y.o. Male  MRN: [id number] Visit Date: 05/18/2022  Today's healthcare provider: [name], PA-C   Cc. Htn, dm f/u  Subjective    HPI  Hypertension, follow-up  BP Readings from Last 3 Encounters:  05/18/22 123/79  04/17/22 (!) 144/88  01/15/22 (!) 136/96   Wt Readings from Last 3 Encounters:  05/18/22 202 lb (91.6 kg)  04/17/22 209 lb 1.6 oz (94.8 kg)  01/15/22 205 lb (93 kg)     Pt was last seen for htn 10/23, bp was elevated in office managed on lisinopril 10 mg.   Outside blood pressures are not being consistently checked Symptoms: Denies chest pain, sob.  Pertinent labs Lab Results  Component Value Date   CHOL 140 01/15/2022   HDL 41 01/15/2022   LDLCALC 83 01/15/2022   TRIG 81 01/15/2022   CHOLHDL 3.4 01/15/2022   Lab Results  Component Value Date   NA 139 01/15/2022   K 4.9 01/15/2022   CREATININE 0.93 01/15/2022   EGFR 92 01/15/2022   GLUCOSE 103 (H) 01/15/2022   TSH 1.170 12/02/2020     The 10-year ASCVD risk score (Arnett DK, et al., 2019) is: 20.4%  --------------------------------------------------------------------------------------------------- Diabetes Mellitus Type II, Follow-up  Lab Results  Component Value Date   HGBA1C 6.6 (A) 05/18/2022   HGBA1C 6.5 (A) 01/15/2022   HGBA1C 7.3 (A) 10/11/2021   Wt Readings from Last 3 Encounters:  05/18/22 202 lb (91.6 kg)  04/17/22 209 lb 1.6 oz (94.8 kg)  01/15/22 205 lb (93 kg)   Pt was last seen for this 10/23, monitored on jardiance 25 mg, glimepiride 2 mg and metformin 1000 mg BID.   Denies changes in neuropathy symptoms, denies hypoglycemic epsiodes.    Current insulin regiment: none   Pertinent Labs: Lab Results  Component Value Date   CHOL 140 01/15/2022   HDL 41 01/15/2022   LDLCALC 83 01/15/2022   TRIG 81 01/15/2022   CHOLHDL 3.4 01/15/2022   Lab Results  Component Value Date   NA 139  01/15/2022   K 4.9 01/15/2022   CREATININE 0.93 01/15/2022   EGFR 92 01/15/2022   LABMICR <3.0 10/11/2021   MICRALBCREAT <12 10/11/2021     ---------------------------------------------------------------------------------------------------  Medications: Outpatient Medications Prior to Visit  Medication Sig   albuterol (VENTOLIN HFA) 108 (90 Base) MCG/ACT inhaler Inhale 2 puffs into the lungs every 6 (six) hours as needed for wheezing or shortness of breath.   empagliflozin (JARDIANCE) 25 MG TABS tablet Take 1 tablet (25 mg total) by mouth daily.   FLUZONE QUADRIVALENT 0.5 ML injection    gabapentin (NEURONTIN) 300 MG capsule Take 1 capsule (300 mg total) by mouth 3 (three) times daily.   glimepiride (AMARYL) 2 MG tablet TAKE 1 TABLET (2 MG TOTAL) BY MOUTH DAILY BEFORE BREAKFAST.   hydrocortisone 2.5 % lotion Apply topically daily. Tuesday, Thursday, Saturday   ketoconazole (NIZORAL) 2 % cream Apply 1 application topically daily. Monday, Wednesday, Friday   lisinopril (ZESTRIL) 10 MG tablet TAKE 1 TABLET EVERY DAY   metFORMIN (GLUCOPHAGE) 1000 MG tablet TAKE 1 TABLET TWICE DAILY WITH MEALS   mupirocin ointment (BACTROBAN) 2 % Apply 1 application topically daily. Qd to excision site   nitroGLYCERIN (NITROSTAT) 0.4 MG SL tablet Place 1 tablet (0.4 mg total) under the tongue every 5 (five) minutes as needed.   ONETOUCH ULTRA test strip USE 1 STRIP  TO CHECK GLUCOSE ONCE DAILY   pravastatin (PRAVACHOL) 40 MG tablet Take 1 tablet (40 mg total) by mouth daily.   tiotropium (SPIRIVA HANDIHALER) 18 MCG inhalation capsule Place 1 capsule (18 mcg total) into inhaler and inhale daily.   No facility-administered medications prior to visit.    Review of Systems  Constitutional:  Negative for fatigue and fever.  Respiratory:  Negative for cough and shortness of breath.   Cardiovascular:  Negative for chest pain, palpitations and leg swelling.  Neurological:  Negative for dizziness and headaches.       Objective    BP 123/79 (BP Location: Right Arm, Patient Position: Sitting, Cuff Size: Normal)   Pulse 88   Temp 98 F (36.7 C)   Ht '6\' 1"'$  (1.854 m)   Wt 202 lb (91.6 kg)   SpO2 98%   BMI 26.65 kg/m    Physical Exam Constitutional:      General: He is awake.     Appearance: He is well-developed.  HENT:     Head: Normocephalic.  Eyes:     Conjunctiva/sclera: Conjunctivae normal.  Cardiovascular:     Rate and Rhythm: Normal rate and regular rhythm.     Pulses:          Dorsalis pedis pulses are 2+ on the right side and 2+ on the left side.       Posterior tibial pulses are 2+ on the right side and 2+ on the left side.     Heart sounds: Normal heart sounds.  Pulmonary:     Effort: Pulmonary effort is normal.     Breath sounds: Normal breath sounds.  Feet:     Right foot:     Protective Sensation: 4 sites tested.  4 sites sensed.     Skin integrity: Dry skin present.     Toenail Condition: Right toenails are normal.     Left foot:     Protective Sensation: 4 sites tested.   1 site sensed.    Skin integrity: Dry skin present.     Toenail Condition: Left toenails are normal.  Skin:    General: Skin is warm.  Neurological:     Mental Status: He is alert and oriented to person, place, and time.  Psychiatric:        Attention and Perception: Attention normal.        Mood and Affect: Mood normal.        Speech: Speech normal.        Behavior: Behavior is cooperative.     Results for orders placed or performed in visit on 05/18/22  POCT glycosylated hemoglobin (Hb A1C)  Result Value Ref Range   Hemoglobin A1C 6.6 (A) 4.0 - 5.6 %   HbA1c POC (<> result, manual entry)     HbA1c, POC (prediabetic range)     HbA1c, POC (controlled diabetic range)      Assessment & Plan     Problem List Items Addressed This Visit       Cardiovascular and Mediastinum   Hypertension associated with diabetes ([place])    Well controlled today Continue lisinopril 10 mg Reviewed cmp   Bp will elevated with pain but we have normal values in office as well, ie today. F/u 6 mo         Endocrine   Type 2 diabetes mellitus (HCC) - Primary    A1c today 6.6% stable from 6.5% last visit Managed with jardiance 25 mg, glimepiride 2 mg and metformin  1000 mg bid Foot exam completed today, uacr utd , sent referral for optho On statin, acei/arb F/u 6 mo      Relevant Orders   POCT glycosylated hemoglobin (Hb A1C) (Completed)   Ambulatory referral to Ophthalmology   Other Visit Diagnoses     Colon cancer screening       Relevant Orders   Ambulatory referral to Gastroenterology        Return in about 6 months (around 11/16/2022) for CPE, AVW.      I, [name], PA-C have reviewed all documentation for this visit. The documentation on  05/18/22  for the exam, diagnosis, procedures, and orders are all accurate and complete.  [name], PA-C [facility] [address] [place], [place], [address] Office: [phone] Fax: [place]

## 2022-05-18 NOTE — Assessment & Plan Note (Signed)
Well controlled today Continue lisinopril 10 mg Reviewed cmp  Bp will elevated with pain but we have normal values in office as well, ie today. F/u 6 mo

## 2022-05-18 NOTE — Assessment & Plan Note (Signed)
A1c today 6.6% stable from 6.5% last visit Managed with jardiance 25 mg, glimepiride 2 mg and metformin 1000 mg bid Foot exam completed today, uacr utd , sent referral for optho On statin, acei/arb F/u 6 mo

## 2022-05-29 ENCOUNTER — Encounter: Payer: Self-pay | Admitting: *Deleted

## 2022-06-18 ENCOUNTER — Ambulatory Visit: Payer: Medicare HMO

## 2022-06-18 DIAGNOSIS — I152 Hypertension secondary to endocrine disorders: Secondary | ICD-10-CM

## 2022-06-18 DIAGNOSIS — E1100 Type 2 diabetes mellitus with hyperosmolarity without nonketotic hyperglycemic-hyperosmolar coma (NKHHC): Secondary | ICD-10-CM

## 2022-06-18 NOTE — Progress Notes (Signed)
Care Management & Coordination Services Pharmacy Note  06/18/2022 Name:  Patrick Grant MRN:  [id number] DOB:  [birth year]  Summary: Patient presents for follow-up consult.   -Patient stopped Jardiance ~3 weeks ago, feels his dizzy spells improved since stopping the medication.   Recommendations/Changes made from today's visit: Continue current medications   Follow up plan: CPP follow-up 6 months   Subjective: Patrick Grant is an 67 y.o. year old male who is a primary patient of Thedore Mins, Ria Comment, [place].  The care coordination team was consulted for assistance with disease management and care coordination needs.    Engaged with patient by telephone for follow up visit.  Recent office visits: 05/18/22: Patient presented to [name], PA-C for follow-up. A1c 6.6%.  04/17/22: Patient presented to [name], PA-C for follow-up.  Recent consult visits: None in previous 6 months  Hospital visits: None in previous 6 months   Objective:  Lab Results  Component Value Date   CREATININE 0.93 01/15/2022   BUN 12 01/15/2022   EGFR 92 01/15/2022   GFRNONAA 67 03/21/2020   GFRAA 78 03/21/2020   NA 139 01/15/2022   K 4.9 01/15/2022   CALCIUM 9.6 01/15/2022   CO2 20 01/15/2022   GLUCOSE 103 (H) 01/15/2022    Lab Results  Component Value Date/Time   HGBA1C 6.6 (A) 05/18/2022 09:43 AM   HGBA1C 6.5 (A) 01/15/2022 10:54 AM   HGBA1C 8.3 (H) 05/09/2021 10:25 AM   HGBA1C 8.8 (H) 12/02/2020 08:04 AM   MICROALBUR negative 01/10/2018 03:02 PM   MICROALBUR 20 01/25/2017 01:55 PM    Last diabetic Eye exam: No results found for: "HMDIABEYEEXA"  Last diabetic Foot exam: No results found for: "HMDIABFOOTEX"   Lab Results  Component Value Date   CHOL 140 01/15/2022   HDL 41 01/15/2022   LDLCALC 83 01/15/2022   TRIG 81 01/15/2022   CHOLHDL 3.4 01/15/2022       Latest Ref Rng & Units 01/15/2022   10:18 AM 05/09/2021   10:25 AM 12/02/2020    8:04 AM  Hepatic Function   Total Protein 6.0 - 8.5 g/dL 6.8  7.3  6.8   Albumin 3.9 - 4.9 g/dL 4.5  5.2  4.9   AST 0 - 40 IU/L 13  17  11    ALT 0 - 44 IU/L 13  20  20    Alk Phosphatase 44 - 121 IU/L 80  99  102   Total Bilirubin 0.0 - 1.2 mg/dL 0.4  0.4  0.5     Lab Results  Component Value Date/Time   TSH 1.170 12/02/2020 08:04 AM   TSH 1.420 03/21/2020 09:52 AM       Latest Ref Rng & Units 12/02/2020    8:04 AM 03/21/2020    9:52 AM 12/15/2019   11:55 AM  CBC  WBC 3.4 - 10.8 x10E3/uL 8.3  11.0  9.1   Hemoglobin 13.0 - 17.7 g/dL 14.9  16.4  15.8   Hematocrit 37.5 - 51.0 % 44.4  48.5  46.3   Platelets 150 - 450 x10E3/uL 334  377  354     No results found for: "VD25OH", "VITAMINB12"  Clinical ASCVD: No  The 10-year ASCVD risk score (Arnett DK, et al., 2019) is: 20.4%   Values used to calculate the score:     Age: 14 years     Sex: Male     Is Non-Hispanic African American: No     Diabetic: Yes     Tobacco  smoker: No     Systolic Blood Pressure: [id number] mmHg     Is BP treated: Yes     HDL Cholesterol: 41 mg/dL     Total Cholesterol: 140 mg/dL       05/18/2022    9:33 AM 01/15/2022   10:40 AM 08/23/2021    1:52 PM  Depression screen PHQ 2/9  Decreased Interest 0 0 0  Down, Depressed, Hopeless 0 0 0  PHQ - 2 Score 0 0 0  Altered sleeping 0 3   Tired, decreased energy 0 2   Change in appetite 0 1   Feeling bad or failure about yourself  0 0   Trouble concentrating 0 1   Moving slowly or fidgety/restless 0 0   Suicidal thoughts 0 0   PHQ-9 Score 0 7   Difficult doing work/chores  Not difficult at all      Social History   Tobacco Use  Smoking Status Former   Types: Cigars   Quit date: 03/12/2016   Years since quitting: 6.2  Smokeless Tobacco Never   BP Readings from Last 3 Encounters:  05/18/22 123/79  04/17/22 (!) 144/88  01/15/22 (!) 136/96   Pulse Readings from Last 3 Encounters:  05/18/22 88  04/17/22 (!) 102  01/15/22 90   Wt Readings from Last 3 Encounters:  05/18/22  202 lb (91.6 kg)  04/17/22 209 lb 1.6 oz (94.8 kg)  01/15/22 205 lb (93 kg)   BMI Readings from Last 3 Encounters:  05/18/22 26.65 kg/m  04/17/22 27.59 kg/m  01/15/22 27.05 kg/m    Allergies  Allergen Reactions   Almond (Diagnostic) Swelling    throat   Fentanyl Other (See Comments)    Other reaction(s): Jittery   Clove Flavor Other (See Comments)    Loss of vision for 1-2 hrs    Medications Reviewed Today     Reviewed by [name], PA-C (Physician Assistant Certified) on [id number] at 1044  Med List Status: <None>   Medication Order Taking? Sig Documenting Provider Last Dose Status Informant  albuterol (VENTOLIN HFA) 108 (90 Base) MCG/ACT inhaler [id number] No Inhale 2 puffs into the lungs every 6 (six) hours as needed for wheezing or shortness of breath. Chrismon, Vickki Muff, PA-C Taking Active   empagliflozin (JARDIANCE) 25 MG TABS tablet [id number] No Take 1 tablet (25 mg total) by mouth daily. [name], PA-C Taking Active   FLUZONE QUADRIVALENT 0.5 ML injection [id number] No  [provider] Taking Active   gabapentin (NEURONTIN) 300 MG capsule [id number]  Take 1 capsule (300 mg total) by mouth 3 (three) times daily. [name], PA-C  Active   glimepiride (AMARYL) 2 MG tablet [id number]  TAKE 1 TABLET (2 MG TOTAL) BY MOUTH DAILY BEFORE BREAKFAST. [name], PA-C  Active   hydrocortisone 2.5 % lotion [id number] No Apply topically daily. Tuesday, Thursday, Saturday Ralene Bathe, MD Taking Active   ketoconazole (NIZORAL) 2 % cream [id number] No Apply 1 application topically daily. Monday, Wednesday, Friday Ralene Bathe, MD Taking Active   lisinopril (ZESTRIL) 10 MG tablet [id number]  TAKE 1 TABLET EVERY DAY [name], PA-C  Active   metFORMIN (GLUCOPHAGE) 1000 MG tablet [id number]  TAKE 1 TABLET TWICE DAILY WITH MEALS Drubel, Ria Comment, PA-C  Active   mupirocin ointment (BACTROBAN) 2 % [id number] No Apply 1 application topically daily. Qd to  excision site Ralene Bathe, MD Taking Active   nitroGLYCERIN (NITROSTAT) 0.4 MG SL tablet [id number] No Place 1 tablet (0.4  mg total) under the tongue every 5 (five) minutes as needed. Wende Bushy, MD Taking Active            Med Note [place] [employer]ty   Wed Jun 12, 2017 10:09 AM) Reports has not needed recently   [facility] ULTRA test strip [id number] No USE 1 STRIP TO CHECK GLUCOSE ONCE DAILY Birdie Sons, MD Taking Active   pravastatin (PRAVACHOL) 40 MG tablet [id number]  Take 1 tablet (40 mg total) by mouth daily. [name], PA-C  Active   tiotropium (SPIRIVA HANDIHALER) 18 MCG inhalation capsule [id number] No Place 1 capsule (18 mcg total) into inhaler and inhale daily. Chrismon, Vickki Muff, PA-C Taking Expired 09/27/21 2359             SDOH:  (Social Determinants of Health) assessments and interventions performed: Yes SDOH Interventions    Flowsheet Row Office Visit from 01/15/2022 in [place] from 08/23/2021 in [place] [facility] from 02/06/2021 in [place] [facility] from 01/13/2021 in [place] [facility] from 11/11/2020 in [place] from 02/23/2020 in [place]  SDOH Interventions        Food Insecurity Interventions -- Intervention Not Indicated -- -- -- --  Housing Interventions -- Intervention Not Indicated -- -- -- --  Transportation Interventions -- Patient Resources Tax adviser) -- -- -- --  Depression Interventions/Treatment  PHQ2-9 Score <4 Follow-up Not Indicated -- -- -- -- --  Financial Strain Interventions -- Intervention Not Indicated, Patient Refused Other (Comment)  [PAP] Intervention Not Indicated Intervention Not Indicated Other (Comment)  [Pt is in contact with the [facility] team and is getting assistance  with medications.]  Physical Activity Interventions -- Intervention Not Indicated -- -- -- Patient Refused  Stress Interventions -- Intervention Not Indicated -- -- -- Other (Comment)  [Due to pain.]  Social Connections Interventions -- Intervention Not Indicated -- -- -- --       Medication Assistance: None required.  Patient affirms current coverage meets needs.  Medication Access: Within the past 30 days, how often has patient missed a dose of medication? None Is a pillbox or other method used to improve adherence? No  Factors that may affect medication adherence? no barriers identified Are meds synced by current pharmacy? No  Are meds delivered by current pharmacy? Yes  Does patient experience delays in picking up medications due to transportation concerns? No   [facility] Services Reviewed: Is patient disadvantaged to use [facility]?: Yes  Current Rx insurance plan: [employer] Name and location of Current pharmacy:  [place], [place] [address] Phone: [phone] Fax: [phone]  [facility] [place], [place] - [address] [place] [address] Phone: [phone] Fax: [phone]  [facility] services reviewed with patient today?: No  Patient requests to transfer care to [facility]?: No  Reason patient declined to change pharmacies: Disadvantaged due to insurance/mail order  Compliance/Adherence/Medication fill history: Care Gaps: Shingrix  Covid  Colonoscopy   Star-Rating Drugs: Glimepiride 2 mg: Last filled 05/20/22 for 90-DS  Lisinopril 10 mg: Last filled 05/20/22 for 90-DS Metformin 1000 mg: Last filled 05/20/22 for 90-DS Pravastatin 40 mg: Last filled 05/20/22 for 90-DS  Assessment/Plan  Hypertension (BP goal <130/80) -Controlled -Current  treatment: Lisinopril 10 mg daily  -Medications previously  tried: NA  -Current home readings: Not monitoring regularly  -Denies hypotensive/hypertensive symptoms -Counseled to monitor BP at home weekly, document, and provide log at future appointments  Hyperlipidemia: (LDL goal < 70) -Uncontrolled -Current treatment: Pravastatin 40 mg daily  -Medications previously tried: NA  -Given high risk of ASCVD, patient is a candidate for high intensity statin. -Recommend rechecking FLP -Recommend stopping pravastatin, starting atorvastatin 40 mg daily   Diabetes (A1c goal <7%) -Controlled, but improving.  -Current medications: Glimepiride 2 mg daily before breakfast  Metformin 1000 mg twice daily -Medications previously tried: Invokana, glipizide,   -Current home glucose readings: 105, 97. Highest in recall was 112.  -Denies hypoglycemic symptoms -Patient stopped Jardiance ~3 weeks ago, feels his dizzy spells improved since stopping the medication.  -Focusing on smaller portions for his meals.  -Continue current medications   COPD (Goal: control symptoms and prevent exacerbations) -Controlled -Current treatment  Albuterol  Spiriva 1 daily as needed  -Medications previously tried: NA  -Pulmonary function testing: FEV 71% (2019) -Exacerbations requiring treatment in last 6 months: None -Patient reports consistent use of maintenance inhaler, although he has stopped his Dulera  -Recommended to continue current medication   [name], PharmD, [place], [place] Pharmacist Practitioner  [facility] [phone]

## 2022-06-20 ENCOUNTER — Other Ambulatory Visit: Payer: Self-pay | Admitting: Physician Assistant

## 2022-06-20 DIAGNOSIS — E1169 Type 2 diabetes mellitus with other specified complication: Secondary | ICD-10-CM

## 2022-07-25 ENCOUNTER — Telehealth: Payer: Self-pay

## 2022-07-25 ENCOUNTER — Ambulatory Visit: Payer: Self-pay | Admitting: *Deleted

## 2022-07-25 NOTE — Telephone Encounter (Signed)
Contacted patient. He reports he is feeling better so he is not wanting an appointment at this time. Advised if symptoms stay persistent or he begins to feel worse to contact us and schedule an appointment. Patient verbalized understanding

## 2022-07-25 NOTE — Progress Notes (Signed)
Care Management & Coordination Services Pharmacy Team Pharmacy Assistant   Name: Patrick Grant  MRN: [id number] DOB: 01/30/1956  Reason for Encounter: Diabetes  Contacted patient to discuss diabetes disease state. Spoke with patient on 07/25/2022   Chart Updates:  Recent office visits:  None ID  Recent consult visits:  None ID  Hospital visits:  None in previous 6 months  Medications: Outpatient Encounter Medications as of 07/25/2022  Medication Sig Note   albuterol (VENTOLIN HFA) 108 (90 Base) MCG/ACT inhaler Inhale 2 puffs into the lungs every 6 (six) hours as needed for wheezing or shortness of breath.    empagliflozin (JARDIANCE) 25 MG TABS tablet Take 1 tablet (25 mg total) by mouth daily. (Patient not taking: Reported on 06/18/2022)    FLUZONE QUADRIVALENT 0.5 ML injection     gabapentin (NEURONTIN) 300 MG capsule Take 1 capsule (300 mg total) by mouth 3 (three) times daily.    glimepiride (AMARYL) 2 MG tablet TAKE 1 TABLET (2 MG TOTAL) BY MOUTH DAILY BEFORE BREAKFAST.    hydrocortisone 2.5 % lotion Apply topically daily. Tuesday, Thursday, Saturday    ketoconazole (NIZORAL) 2 % cream Apply 1 application topically daily. Monday, Wednesday, Friday    lisinopril (ZESTRIL) 10 MG tablet TAKE 1 TABLET EVERY DAY    metFORMIN (GLUCOPHAGE) 1000 MG tablet TAKE 1 TABLET TWICE DAILY WITH MEALS    mupirocin ointment (BACTROBAN) 2 % Apply 1 application topically daily. Qd to excision site    nitroGLYCERIN (NITROSTAT) 0.4 MG SL tablet Place 1 tablet (0.4 mg total) under the tongue every 5 (five) minutes as needed. 06/12/2017: Reports has not needed recently    ONETOUCH ULTRA test strip USE 1 STRIP TO CHECK GLUCOSE ONCE DAILY    pravastatin (PRAVACHOL) 40 MG tablet TAKE 1 TABLET EVERY DAY    tiotropium (SPIRIVA HANDIHALER) 18 MCG inhalation capsule Place 1 capsule (18 mcg total) into inhaler and inhale daily.    No facility-administered encounter medications on file as of 07/25/2022.    Recent Relevant Labs: Lab Results  Component Value Date/Time   HGBA1C 6.6 (A) 05/18/2022 09:43 AM   HGBA1C 6.5 (A) 01/15/2022 10:54 AM   HGBA1C 8.3 (H) 05/09/2021 10:25 AM   HGBA1C 8.8 (H) 12/02/2020 08:04 AM   MICROALBUR negative 01/10/2018 03:02 PM   MICROALBUR 20 01/25/2017 01:55 PM    Kidney Function Lab Results  Component Value Date/Time   CREATININE 0.93 01/15/2022 10:18 AM   CREATININE 1.06 05/09/2021 10:25 AM   CREATININE 0.92 02/06/2017 09:40 AM   GFRNONAA 67 03/21/2020 09:52 AM   GFRNONAA 91 02/06/2017 09:40 AM   GFRAA 78 03/21/2020 09:52 AM   GFRAA 105 02/06/2017 09:40 AM   Current antihyperglycemic regimen:  Glimepiride 2 mg daily before breakfast  Metformin 1000 mg twice daily    Patient verbally confirms he is taking the above medications as directed. Yes, per patient he only took 1/2 of his glimepiride yesterday and did not take the Metformin due to being ill and not having an appetite. Patient advised he never misses a dose but he was feeling so bad yesterday he slept most of the day and didn't remember to take his other medications.  What diet changes have been made to improve diabetes control? Patient just is aware of what he eats so he can keep his levels low  What recent interventions/DTPs have been made to improve glycemic control:  Patient discontinued Jardiance due to side effect of dizziness.  Have there been any recent hospitalizations  or ED visits since last visit with PharmD? No  Patient denies hypoglycemic symptoms, including Pale, Sweaty, Shaky, Hungry, Nervous/irritable, and Vision changes  Patient denies hyperglycemic symptoms, including blurry vision, excessive thirst, fatigue, polyuria, and weakness  How often are you checking your blood sugar? once daily  What are your blood sugars ranging?  Fasting: this morning 101, normally 120-130   During the week, how often does your blood glucose drop below 70? Never  Are you checking your feet  daily/regularly? Yes  Adherence Review: Is the patient currently on a STATIN medication? Yes Is the patient currently on ACE/ARB medication? No Does the patient have >5 day gap between last estimated fill dates? No Star Rating Drugs:  Glimepiride 2 mg: Last filled 05/20/22 for 90-DS  Lisinopril 10 mg: Last filled 05/20/22 for 90-DS Metformin 1000 mg: Last filled 05/20/22 for 90-DS Pravastatin 40 mg: Last filled 05/20/22 for 90-DS   Care Gaps: Annual wellness visit in last year? Yes, but he is scheduled for his 2024 in May Last eye exam / retinopathy screening: Never done Last diabetic foot exam: 05/18/2022  I spoke to the patient and he advised that he isn't feeling well, and that he seems to have a cold. Per patient he is congested and has a productive cough. The patient stated the cough isn't constant but it did wake him up last night. Per patient he also is stuffy, and his nose seems to be stopped up and then all of a sudden it starts draining really bad. The patient doesn't have an appetite. He reports yesterday he was cold all day could not get warm even with a electric blanket. Patient stated he did not eat anything yesterday and he was feeling so bad he slept all day. Patient denies nausea or vomiting. Patient isn't sure if he is running a fever. Patient denies taking OTC meds for his symptoms. Patient denies shortness of breath. Patient does state he is having on and off headaches. Per patient he is feeling a little better than yesterday but is still ill.  Patient stated that since being off the Jardiance his blood sugars are still really good, and patient stated his off balance and dizziness has gone away. I advised patient I would let PCP know of his symptoms and they would have a nurse contact him for further Triage if needed. Patient is agreeable to having someone contact him just in case his symptoms get worse before going into the weekend. I did encourage the patient to try to stay  hydrated even if he isn't eating anything.   I contacted the providers office and they will send a message back to the nurse to have someone call him for follow-up.  Patient has follow-up with Angelena Sole, CPP on 11/06/2022 @ 1500.  Adelene Idler, CPA/CMA Clinical Pharmacist Assistant Phone: [phone]

## 2022-07-25 NOTE — Telephone Encounter (Signed)
Summary: Pharmacy nurse [facility] Trinna Post) wants NT to FU with pt   I just received a fu call from Tashia, Alex's nurse. She is making a note regard pt as just spoke with him re med. He is not feeling well at all. Cough, congestion,  etc please refer to notes. She told him that a nurse may be following up. Pls read her notes before reaching out to pt. FU at [phone]         Chief Complaint: cold sx Symptoms: cough clear mucus , stuffy nose at times then runs. Not sleeping last night due to sx. Chills all day yesterday and cold. Slept most of day. Headache on/ off. No appetite yesterday . Today feels better. No chills, eating and drinking better.  Frequency: Monday evening sx started Pertinent Negatives: Patient denies chest pain no difficulty breathing no fever reported has not taken temp. No N/V Disposition: [] ED /[] Urgent Care (no appt availability in office) / [] Appointment(In office/virtual)/ []  [facility]/ [x] Home Care/ [] Refused Recommended Disposition /[] [facility]/ []  Follow-up with PCP Additional Notes:   Offered appt and patient reports no transportation at this time. Offered My Chart VV and patient does not know how to do VV. Recommended if at home covid test available to check for covid . Recommended to stay hydrated and to call back if sx worsen .    Reason for Disposition  Cough with cold symptoms (e.g., runny nose, postnasal drip, throat clearing)  Answer Assessment - Initial Assessment Questions 1. ONSET: "When did the cough begin?"      Monday evening 07/23/22  2. SEVERITY: "How bad is the cough today?"      Not bad  3. SPUTUM: "Describe the color of your sputum" (none, dry cough; clear, white, yellow, green)     *No Answer* 4. HEMOPTYSIS: "Are you coughing up any blood?" If so ask: "How much?" (flecks, streaks, tablespoons, etc.)     *No Answer* 5. DIFFICULTY BREATHING: "Are you having difficulty breathing?" If Yes, ask: "How bad is  it?" (e.g., mild, moderate, severe)    - MILD: No SOB at rest, mild SOB with walking, speaks normally in sentences, can lie down, no retractions, pulse < 100.    - MODERATE: SOB at rest, SOB with minimal exertion and prefers to sit, cannot lie down flat, speaks in phrases, mild retractions, audible wheezing, pulse 100-120.    - SEVERE: Very SOB at rest, speaks in single words, struggling to breathe, sitting hunched forward, retractions, pulse > 120      *No Answer* 6. FEVER: "Do you have a fever?" If Yes, ask: "What is your temperature, how was it measured, and when did it start?"     *No Answer* 7. CARDIAC HISTORY: "Do you have any history of heart disease?" (e.g., heart attack, congestive heart failure)      *No Answer* 8. LUNG HISTORY: "Do you have any history of lung disease?"  (e.g., pulmonary embolus, asthma, emphysema)     *No Answer* 9. PE RISK FACTORS: "Do you have a history of blood clots?" (or: recent major surgery, recent prolonged travel, bedridden)     *No Answer* 10. OTHER SYMPTOMS: "Do you have any other symptoms?" (e.g., runny nose, wheezing, chest pain)       *No Answer* 11. PREGNANCY: "Is there any chance you are pregnant?" "When was your last menstrual period?"       na 12. TRAVEL: "Have you traveled out of the country in the last  month?" (e.g., travel history, exposures)       na  Protocols used: Cough - Acute Productive-A-AH

## 2022-08-27 ENCOUNTER — Ambulatory Visit (INDEPENDENT_AMBULATORY_CARE_PROVIDER_SITE_OTHER): Payer: Medicare HMO

## 2022-08-27 VITALS — Ht 74.0 in | Wt 196.0 lb

## 2022-08-27 DIAGNOSIS — Z0000 Encounter for general adult medical examination without abnormal findings: Secondary | ICD-10-CM

## 2022-08-27 NOTE — Progress Notes (Signed)
I connected with  Philbert Bar Barkey on 08/27/22 by a audio enabled telemedicine application and verified that I am speaking with the correct person using two identifiers.  Patient Location: Home  Provider Location: Office/Clinic  I discussed the limitations of evaluation and management by telemedicine. The patient expressed understanding and agreed to proceed.  Subjective:   Patrick Grant is a 67 y.o. male who presents for Medicare Annual/Subsequent preventive examination.  Review of Systems    Cardiac Risk Factors include: advanced age (>33men, >34 women);diabetes mellitus;dyslipidemia;hypertension;male gender;sedentary lifestyle    Objective:    Today's Vitals   08/27/22 1306 08/27/22 1307  Weight: 196 lb (88.9 kg)   Height: 6\' 2"  (1.88 m)   PainSc:  7    Body mass index is 25.16 kg/m.     08/27/2022    1:23 PM 08/23/2021    1:55 PM 02/23/2020   11:11 AM 05/18/2019    9:17 AM 04/17/2019    9:41 AM 02/16/2019   10:42 AM 02/13/2018    9:42 AM  Advanced Directives  Does Patient Have a Medical Advance Directive? No No No No No No No  Would patient like information on creating a medical advance directive?  No - Patient declined No - Patient declined No - Patient declined  Yes (ED - Information included in AVS) Yes (MAU/Ambulatory/Procedural Areas - Information given)    Current Medications (verified) Outpatient Encounter Medications as of 08/27/2022  Medication Sig   albuterol (VENTOLIN HFA) 108 (90 Base) MCG/ACT inhaler Inhale 2 puffs into the lungs every 6 (six) hours as needed for wheezing or shortness of breath.   FLUZONE QUADRIVALENT 0.5 ML injection    gabapentin (NEURONTIN) 300 MG capsule Take 1 capsule (300 mg total) by mouth 3 (three) times daily.   glimepiride (AMARYL) 2 MG tablet TAKE 1 TABLET (2 MG TOTAL) BY MOUTH DAILY BEFORE BREAKFAST.   hydrocortisone 2.5 % lotion Apply topically daily. Tuesday, Thursday, Saturday   ketoconazole (NIZORAL) 2 % cream Apply 1  application topically daily. Monday, Wednesday, Friday   lisinopril (ZESTRIL) 10 MG tablet TAKE 1 TABLET EVERY DAY   metFORMIN (GLUCOPHAGE) 1000 MG tablet TAKE 1 TABLET TWICE DAILY WITH MEALS   mupirocin ointment (BACTROBAN) 2 % Apply 1 application topically daily. Qd to excision site   nitroGLYCERIN (NITROSTAT) 0.4 MG SL tablet Place 1 tablet (0.4 mg total) under the tongue every 5 (five) minutes as needed.   ONETOUCH ULTRA test strip USE 1 STRIP TO CHECK GLUCOSE ONCE DAILY   pravastatin (PRAVACHOL) 40 MG tablet TAKE 1 TABLET EVERY DAY   empagliflozin (JARDIANCE) 25 MG TABS tablet Take 1 tablet (25 mg total) by mouth daily. (Patient not taking: Reported on 06/18/2022)   tiotropium (SPIRIVA HANDIHALER) 18 MCG inhalation capsule Place 1 capsule (18 mcg total) into inhaler and inhale daily.   No facility-administered encounter medications on file as of 08/27/2022.    Allergies (verified) Almond (diagnostic), Fentanyl, and Clove flavor   History: Past Medical History:  Diagnosis Date   BCC (basal cell carcinoma) 05/16/2020   right chest. Nodular, EXC 06/21/20   COPD (chronic obstructive pulmonary disease) (HCC)    CRPS (complex regional pain syndrome)    Diabetes mellitus without complication (HCC)    Dizzy spells    occasional   Hyperlipidemia    Hypertension    Shingles 04/17/2019   Sleep apnea    Uses crutches    or wheelchair   Past Surgical History:  Procedure Laterality Date  COLONOSCOPY WITH PROPOFOL N/A 05/18/2019   Procedure: COLONOSCOPY WITH PROPOFOL;  Surgeon: Wyline Mood, MD;  Location: [facility];  Service: Endoscopy;  Laterality: N/A;  Diabetic - oral meds sleep apnea   POLYPECTOMY N/A 05/18/2019   Procedure: POLYPECTOMY;  Surgeon: Wyline Mood, MD;  [facility];  Service: Endoscopy;  Laterality: N/A;   SPINAL CORD STIMULATOR REMOVAL  2010   DUE TO LOSS OF MUSCLE USE WHEN ACTIVATED   Family History  Problem Relation Age of Onset   CVA  Mother    Kidney failure Mother    Diabetes Mother    CVA Father    Kidney failure Father    Colon cancer Father    Ovarian cancer Sister    Diabetes Brother    Healthy Sister    Social History   Socioeconomic History   Marital status: Single    Spouse name: Not on file   Number of children: 0   Years of education: Not on file   Highest education level: Some college, no degree  Occupational History   Occupation: disability  Tobacco Use   Smoking status: Former    Types: Cigars    Quit date: 03/12/2016    Years since quitting: 6.4   Smokeless tobacco: Never  Vaping Use   Vaping Use: Never used  Substance and Sexual Activity   Alcohol use: No    Alcohol/week: 0.0 standard drinks of alcohol   Drug use: No   Sexual activity: Not on file  Other Topics Concern   Not on file  Social History Narrative   Not on file   Social Determinants of Health   Financial Resource Strain: Low Risk  (08/27/2022)   Overall Financial Resource Strain (CARDIA)    Difficulty of Paying Living Expenses: Not very hard  Food Insecurity: No Food Insecurity (08/27/2022)   Hunger Vital Sign    Worried About Running Out of Food in the Last Year: Never true    Ran Out of Food in the Last Year: Never true  Transportation Needs: No Transportation Needs (08/27/2022)   PRAPARE - Administrator, Civil Service (Medical): No    Lack of Transportation (Non-Medical): No  Physical Activity: Inactive (08/27/2022)   Exercise Vital Sign    Days of Exercise per Week: 0 days    Minutes of Exercise per Session: 0 min  Stress: No Stress Concern Present (08/23/2021)   [employer] of Occupational Health - Occupational Stress Questionnaire    Feeling of Stress : Only a little  Social Connections: Socially Isolated (08/27/2022)   Social Connection and Isolation Panel [NHANES]    Frequency of Communication with Friends and Family: Twice a week    Frequency of Social Gatherings with Friends and Family: Once a  week    Attends Religious Services: Never    Database administrator or Organizations: No    Attends Engineer, structural: Never    Marital Status: Never married    Tobacco Counseling Counseling given: Not Answered   Clinical Intake:  Pre-visit preparation completed: Yes  Pain : 0-10 Pain Score: 7  Pain Type: Chronic pain Pain Location: Leg Pain Orientation: Left Pain Descriptors / Indicators: Aching, Tingling, Shooting Pain Onset: More than a month ago Pain Frequency: Constant Pain Relieving Factors: staying off and keeping it warm  Pain Relieving Factors: staying off and keeping it warm  BMI - recorded: 25.16 Nutritional Status: BMI 25 -29 Overweight Nutritional Risks: None Diabetes: Yes CBG  done?: No Did pt. bring in CBG monitor from home?: No  How often do you need to have someone help you when you read instructions, pamphlets, or other written materials from your doctor or pharmacy?: 1 - Never  Diabetic?yes  Interpreter Needed?: No  Comments: lives with partner Boyd Kerbs Information entered by :: B.Tenzin Pavon,LPN   Activities of Daily Living    08/27/2022    1:24 PM 05/18/2022    9:33 AM  In your present state of health, do you have any difficulty performing the following activities:  Hearing? 0 0  Vision? 1 1  Difficulty concentrating or making decisions? 0 0  Walking or climbing stairs? 1 1  Dressing or bathing? 0 0  Doing errands, shopping? 1 1  Preparing Food and eating ? N   Using the Toilet? N   In the past six months, have you accidently leaked urine? N   Do you have problems with loss of bowel control? N   Managing your Medications? N   Managing your Finances? N   Housekeeping or managing your Housekeeping? Y   Comment partner helps     Patient Care Team: Alfredia Ferguson, Cordelia Poche as PCP - General (Physician Assistant) Yevonne Pax, MD as Consulting Physician (Internal Medicine) Pa, [facility]) Wyline Mood, MD as  Consulting Physician (Gastroenterology) Gaspar Cola, [facility] as Pharmacist (Pharmacist)  Indicate any recent Medical Services you may have received from other than [facility] providers in the past year (date may be approximate).     Assessment:   This is a routine wellness examination for [place].  Hearing/Vision screen Hearing Screening - Comments:: Adequate hearing Vision Screening - Comments:: Adequate vision in rt eye; blurry in lft [id number] not drive [facility]-not up to date  Dietary issues and exercise activities discussed: Current Exercise Habits: The patient does not participate in regular exercise at present, Exercise limited by: respiratory conditions(s);neurologic condition(s)   Goals Addressed             This Visit's Progress    DIET - EAT MORE FRUITS AND VEGETABLES   On track    Monitor and Manage My Blood Sugar-Diabetes Type 2   On track    Timeframe:  Long-Range Goal Priority:  High Start Date: 11/11/2020                             Expected End Date: 11/11/2021                       Follow Up within 90 days    - check blood sugar at prescribed times - check blood sugar if I feel it is too high or too low - enter blood sugar readings and medication or insulin into daily log - take the blood sugar log to all doctor visits    Why is this important?   Checking your blood sugar at home helps to keep it from getting very high or very low.  Writing the results in a diary or log helps the doctor know how to care for you.  Your blood sugar log should have the time, date and the results.  Also, write down the amount of insulin or other medicine that you take.  Other information, like what you ate, exercise done and how you were feeling, will also be helpful.     Notes:      Prevent Falls   Not on  track    Recommend to remove any items from the home that may cause slips or trips.       Depression Screen    08/27/2022    1:18 PM 05/18/2022    9:33 AM  01/15/2022   10:40 AM 08/23/2021    1:52 PM 05/09/2021    9:27 AM 12/01/2020    2:28 PM 03/21/2020    9:35 AM  PHQ 2/9 Scores  PHQ - 2 Score 1 0 0 0 0 0 0  PHQ- 9 Score  0 7  1 2 1     Fall Risk    08/27/2022    1:13 PM 05/18/2022    9:32 AM 01/15/2022   10:40 AM 08/23/2021    1:56 PM 05/09/2021    9:26 AM  Fall Risk   Falls in the past year? 1 0 1 1 1   Number falls in past yr: 1 0 1 1 1   Injury with Fall? 0 0 1 0 1  Comment bruised up:no medical visit      Risk for fall due to : No Fall Risks  History of fall(s) Impaired vision;Impaired mobility;Impaired balance/gait No Fall Risks;Other (Comment)  Risk for fall due to: Comment     CRPS  Follow up Education provided;Falls prevention discussed  Falls evaluation completed Falls prevention discussed     FALL RISK PREVENTION PERTAINING TO THE HOME:  Any stairs in or around the home? Yes  If so, are there any without handrails? Yes  Home free of loose throw rugs in walkways, pet beds, electrical cords, etc? Yes  Adequate lighting in your home to reduce risk of falls? Yes   ASSISTIVE DEVICES UTILIZED TO PREVENT FALLS:  Life alert? No  Use of a cane, walker or w/c? No crutches Grab bars in the bathroom? Yes  Shower chair or bench in shower? Yes  Elevated toilet seat or a handicapped toilet? Yes    Cognitive Function:        08/27/2022    1:29 PM 02/16/2019   10:56 AM 01/31/2016    1:25 PM  6CIT Screen  What Year? 0 points 0 points 0 points  What month? 0 points 0 points 3 points  What time? 0 points 0 points 0 points  Count back from 20 0 points 0 points 0 points  Months in reverse 4 points 4 points 4 points  Repeat phrase 0 points 2 points 6 points  Total Score 4 points 6 points 13 points    Immunizations Immunization History  Administered Date(s) Administered   Influenza,inj,Quad PF,6+ Mos 01/01/2014, 02/05/2015, 01/31/2016, 01/09/2017, 01/10/2018, 12/15/2019, 01/15/2022   Influenza-Unspecified 01/25/2021    PFIZER(Purple Top)SARS-COV-2 Vaccination 06/22/2019, 07/21/2019   Pneumococcal Polysaccharide-23 01/09/2017   Tdap 10/10/2020    TDAP status: Up to date  Flu Vaccine status: Up to date  Pneumococcal vaccine status: Up to date  Covid-19 vaccine status: Completed vaccines  Qualifies for Shingles Vaccine? Yes   Zostavax completed No   Shingrix Completed?: No.    Education has been provided regarding the importance of this vaccine. Patient has been advised to call insurance company to determine out of pocket expense if they have not yet received this vaccine. Advised may also receive vaccine at local pharmacy or Health Dept. Verbalized acceptance and understanding.  Screening Tests Health Maintenance  Topic Date Due   OPHTHALMOLOGY EXAM  Never done   Zoster Vaccines- Shingrix (1 of 2) Never done   COVID-19 Vaccine (3 - Pfizer risk series) 08/18/2019  Colonoscopy  05/17/2022   Diabetic kidney evaluation - Urine ACR  10/12/2022   INFLUENZA VACCINE  10/25/2022   HEMOGLOBIN A1C  11/16/2022   Diabetic kidney evaluation - eGFR measurement  01/16/2023   FOOT EXAM  05/19/2023   Medicare Annual Wellness (AWV)  08/27/2023   DTaP/Tdap/Td (2 - Td or Tdap) 10/11/2030   Hepatitis C Screening  Completed   HIV Screening  Completed   HPV VACCINES  Aged Out   Fecal DNA (Cologuard)  Discontinued    Health Maintenance  Health Maintenance Due  Topic Date Due   OPHTHALMOLOGY EXAM  Never done   Zoster Vaccines- Shingrix (1 of 2) Never done   COVID-19 Vaccine (3 - Pfizer risk series) 08/18/2019   Colonoscopy  05/17/2022    Colorectal cancer screening: Type of screening: Colonoscopy. Completed yes. Repeat every 5 years  Lung Cancer Screening: (Low Dose CT Chest recommended if Age 68-80 years, 30 pack-year currently smoking OR have quit w/in 15years.) does qualify.   Lung Cancer Screening Referral: no has appts with Pulmonary  Additional Screening:  Hepatitis C Screening: does not qualify;  Completed yes  Vision Screening: Recommended annual ophthalmology exams for early detection of glaucoma and other disorders of the eye. Is the patient up to date with their annual eye exam?  No  Who is the provider or what is the name of the office in which the patient attends annual eye exams? [facility] If pt is not established with a provider, would they like to be referred to a provider to establish care? No .   Dental Screening: Recommended annual dental exams for proper oral hygiene  Community Resource Referral / Chronic Care Management: CRR required this visit?  No   CCM required this visit?  No      Plan:     I have personally reviewed and noted the following in the patient's chart:   Medical and social history Use of alcohol, tobacco or illicit drugs  Current medications and supplements including opioid prescriptions. Patient is not currently taking opioid prescriptions. Functional ability and status Nutritional status Physical activity Advanced directives List of other physicians Hospitalizations, surgeries, and ER visits in previous 12 months Vitals Screenings to include cognitive, depression, and falls Referrals and appointments  In addition, I have reviewed and discussed with patient certain preventive protocols, quality metrics, and best practice recommendations. A written personalized care plan for preventive services as well as general preventive health recommendations were provided to patient.     Sue Lush, LPN   10/31/4164   Nurse Notes: pt states he is doing alright with exception of chronic left leg pain, pt uses crutches. Pt has no concerns or questions at this time.

## 2022-08-27 NOTE — Patient Instructions (Signed)
[name] , Thank you for taking time to come for your Medicare Wellness Visit. I appreciate your ongoing commitment to your health goals. Please review the following plan we discussed and let me know if I can assist you in the future.   These are the goals we discussed:  Goals      DIET - EAT MORE FRUITS AND VEGETABLES     Monitor and Manage My Blood Sugar-Diabetes Type 2     Timeframe:  Long-Range Goal Priority:  High Start Date: 11/11/2020                             Expected End Date: 11/11/2021                       Follow Up within 90 days    - check blood sugar at prescribed times - check blood sugar if I feel it is too high or too low - enter blood sugar readings and medication or insulin into daily log - take the blood sugar log to all doctor visits    Why is this important?   Checking your blood sugar at home helps to keep it from getting very high or very low.  Writing the results in a diary or log helps the doctor know how to care for you.  Your blood sugar log should have the time, date and the results.  Also, write down the amount of insulin or other medicine that you take.  Other information, like what you ate, exercise done and how you were feeling, will also be helpful.     Notes:      Prevent Falls     Recommend to remove any items from the home that may cause slips or trips.        This is a list of the screening recommended for you and due dates:  Health Maintenance  Topic Date Due   Eye exam for diabetics  Never done   Zoster (Shingles) Vaccine (1 of 2) Never done   COVID-19 Vaccine (3 - Pfizer risk series) 08/18/2019   Colon Cancer Screening  05/17/2022   Yearly kidney health urinalysis for diabetes  10/12/2022   Flu Shot  10/25/2022   Hemoglobin A1C  11/16/2022   Yearly kidney function blood test for diabetes  01/16/2023   Complete foot exam   05/19/2023   Medicare Annual Wellness Visit  08/27/2023   DTaP/Tdap/Td vaccine (2 - Td or Tdap)  10/11/2030   Hepatitis C Screening  Completed   HIV Screening  Completed   HPV Vaccine  Aged Out   Cologuard (Stool DNA test)  Discontinued    Advanced directives: no  Conditions/risks identified: moderate falls risk  Next appointment: Follow up in one year for your annual wellness visit 08/28/2023 @ 1PM telephone  Preventive Care 40-64 Years, Male Preventive care refers to lifestyle choices and visits with your health care provider that can promote health and wellness. What does preventive care include? A yearly physical exam. This is also called an annual well check. Dental exams once or twice a year. Routine eye exams. Ask your health care provider how often you should have your eyes checked. Personal lifestyle choices, including: Daily care of your teeth and gums. Regular physical activity. Eating a healthy diet. Avoiding tobacco and drug use. Limiting alcohol use. Practicing safe sex. Taking low-dose aspirin every day starting at age 10. What happens  during an annual well check? The services and screenings done by your health care provider during your annual well check will depend on your age, overall health, lifestyle risk factors, and family history of disease. Counseling  Your health care provider may ask you questions about your: Alcohol use. Tobacco use. Drug use. Emotional well-being. Home and relationship well-being. Sexual activity. Eating habits. Work and work Astronomer. Screening  You may have the following tests or measurements: Height, weight, and BMI. Blood pressure. Lipid and cholesterol levels. These may be checked every 5 years, or more frequently if you are over 55 years old. Skin check. Lung cancer screening. You may have this screening every year starting at age 73 if you have a 30-pack-year history of smoking and currently smoke or have quit within the past 15 years. Fecal occult blood test (FOBT) of the stool. You may have this test every year  starting at age 43. Flexible sigmoidoscopy or colonoscopy. You may have a sigmoidoscopy every 5 years or a colonoscopy every 10 years starting at age 32. Prostate cancer screening. Recommendations will vary depending on your family history and other risks. Hepatitis C blood test. Hepatitis B blood test. Sexually transmitted disease (STD) testing. Diabetes screening. This is done by checking your blood sugar (glucose) after you have not eaten for a while (fasting). You may have this done every 1-3 years. Discuss your test results, treatment options, and if necessary, the need for more tests with your health care provider. Vaccines  Your health care provider may recommend certain vaccines, such as: Influenza vaccine. This is recommended every year. Tetanus, diphtheria, and acellular pertussis (Tdap, Td) vaccine. You may need a Td booster every 10 years. Zoster vaccine. You may need this after age 58. Pneumococcal 13-valent conjugate (PCV13) vaccine. You may need this if you have certain conditions and have not been vaccinated. Pneumococcal polysaccharide (PPSV23) vaccine. You may need one or two doses if you smoke cigarettes or if you have certain conditions. Talk to your health care provider about which screenings and vaccines you need and how often you need them. This information is not intended to replace advice given to you by your health care provider. Make sure you discuss any questions you have with your health care provider. Document Released: 04/08/2015 Document Revised: 11/30/2015 Document Reviewed: 01/11/2015 Elsevier Interactive Patient Education  2017 [employer].  Fall Prevention in the Home Falls can cause injuries. They can happen to people of all ages. There are many things you can do to make your home safe and to help prevent falls. What can I do on the outside of my home? Regularly fix the edges of walkways and driveways and fix any cracks. Remove anything that might make  you trip as you walk through a door, such as a raised step or threshold. Trim any bushes or trees on the path to your home. Use bright outdoor lighting. Clear any walking paths of anything that might make someone trip, such as rocks or tools. Regularly check to see if handrails are loose or broken. Make sure that both sides of any steps have handrails. Any raised decks and porches should have guardrails on the edges. Have any leaves, snow, or ice cleared regularly. Use sand or salt on walking paths during winter. Clean up any spills in your garage right away. This includes oil or grease spills. What can I do in the bathroom? Use night lights. Install grab bars by the toilet and in the tub and shower. Do  not use towel bars as grab bars. Use non-skid mats or decals in the tub or shower. If you need to sit down in the shower, use a plastic, non-slip stool. Keep the floor dry. Clean up any water that spills on the floor as soon as it happens. Remove soap buildup in the tub or shower regularly. Attach bath mats securely with double-sided non-slip rug tape. Do not have throw rugs and other things on the floor that can make you trip. What can I do in the bedroom? Use night lights. Make sure that you have a light by your bed that is easy to reach. Do not use any sheets or blankets that are too big for your bed. They should not hang down onto the floor. Have a firm chair that has side arms. You can use this for support while you get dressed. Do not have throw rugs and other things on the floor that can make you trip. What can I do in the kitchen? Clean up any spills right away. Avoid walking on wet floors. Keep items that you use a lot in easy-to-reach places. If you need to reach something above you, use a strong step stool that has a grab bar. Keep electrical cords out of the way. Do not use floor polish or wax that makes floors slippery. If you must use wax, use non-skid floor wax. Do not  have throw rugs and other things on the floor that can make you trip. What can I do with my stairs? Do not leave any items on the stairs. Make sure that there are handrails on both sides of the stairs and use them. Fix handrails that are broken or loose. Make sure that handrails are as long as the stairways. Check any carpeting to make sure that it is firmly attached to the stairs. Fix any carpet that is loose or worn. Avoid having throw rugs at the top or bottom of the stairs. If you do have throw rugs, attach them to the floor with carpet tape. Make sure that you have a light switch at the top of the stairs and the bottom of the stairs. If you do not have them, ask someone to add them for you. What else can I do to help prevent falls? Wear shoes that: Do not have high heels. Have rubber bottoms. Are comfortable and fit you well. Are closed at the toe. Do not wear sandals. If you use a stepladder: Make sure that it is fully opened. Do not climb a closed stepladder. Make sure that both sides of the stepladder are locked into place. Ask someone to hold it for you, if possible. Clearly mark and make sure that you can see: Any grab bars or handrails. First and last steps. Where the edge of each step is. Use tools that help you move around (mobility aids) if they are needed. These include: Canes. Walkers. Scooters. Crutches. Turn on the lights when you go into a dark area. Replace any light bulbs as soon as they burn out. Set up your furniture so you have a clear path. Avoid moving your furniture around. If any of your floors are uneven, fix them. If there are any pets around you, be aware of where they are. Review your medicines with your doctor. Some medicines can make you feel dizzy. This can increase your chance of falling. Ask your doctor what other things that you can do to help prevent falls. This information is not intended to replace advice  given to you by your health care  provider. Make sure you discuss any questions you have with your health care provider. Document Released: 01/06/2009 Document Revised: 08/18/2015 Document Reviewed: 04/16/2014 Elsevier Interactive Patient Education  2017 [employer].

## 2022-11-16 ENCOUNTER — Encounter: Payer: Self-pay | Admitting: Family Medicine

## 2022-11-16 ENCOUNTER — Ambulatory Visit (INDEPENDENT_AMBULATORY_CARE_PROVIDER_SITE_OTHER): Payer: Medicare HMO | Admitting: Family Medicine

## 2022-11-16 ENCOUNTER — Ambulatory Visit: Payer: Medicare HMO | Admitting: Physician Assistant

## 2022-11-16 VITALS — BP 161/80 | HR 102 | Wt 207.0 lb

## 2022-11-16 DIAGNOSIS — E785 Hyperlipidemia, unspecified: Secondary | ICD-10-CM

## 2022-11-16 DIAGNOSIS — E1159 Type 2 diabetes mellitus with other circulatory complications: Secondary | ICD-10-CM | POA: Diagnosis not present

## 2022-11-16 DIAGNOSIS — J440 Chronic obstructive pulmonary disease with acute lower respiratory infection: Secondary | ICD-10-CM | POA: Diagnosis not present

## 2022-11-16 DIAGNOSIS — E1140 Type 2 diabetes mellitus with diabetic neuropathy, unspecified: Secondary | ICD-10-CM

## 2022-11-16 DIAGNOSIS — Z7984 Long term (current) use of oral hypoglycemic drugs: Secondary | ICD-10-CM | POA: Diagnosis not present

## 2022-11-16 DIAGNOSIS — Z1211 Encounter for screening for malignant neoplasm of colon: Secondary | ICD-10-CM | POA: Diagnosis not present

## 2022-11-16 DIAGNOSIS — E1165 Type 2 diabetes mellitus with hyperglycemia: Secondary | ICD-10-CM | POA: Diagnosis not present

## 2022-11-16 DIAGNOSIS — J441 Chronic obstructive pulmonary disease with (acute) exacerbation: Secondary | ICD-10-CM | POA: Insufficient documentation

## 2022-11-16 DIAGNOSIS — J209 Acute bronchitis, unspecified: Secondary | ICD-10-CM | POA: Diagnosis not present

## 2022-11-16 DIAGNOSIS — I152 Hypertension secondary to endocrine disorders: Secondary | ICD-10-CM | POA: Diagnosis not present

## 2022-11-16 DIAGNOSIS — E1169 Type 2 diabetes mellitus with other specified complication: Secondary | ICD-10-CM

## 2022-11-16 DIAGNOSIS — E1100 Type 2 diabetes mellitus with hyperosmolarity without nonketotic hyperglycemic-hyperosmolar coma (NKHHC): Secondary | ICD-10-CM | POA: Diagnosis not present

## 2022-11-16 LAB — POCT GLYCOSYLATED HEMOGLOBIN (HGB A1C): Hemoglobin A1C: 7.1 % — AB (ref 4.0–5.6)

## 2022-11-16 MED ORDER — PREDNISONE 10 MG PO TABS: 50.0000 mg | ORAL_TABLET | Freq: Every day | ORAL | 0 refills | Status: DC

## 2022-11-16 MED ORDER — AMOXICILLIN-POT CLAVULANATE 875-125 MG PO TABS: 1.0000 | ORAL_TABLET | Freq: Two times a day (BID) | ORAL | 0 refills | Status: DC

## 2022-11-16 MED ORDER — LOSARTAN POTASSIUM-HCTZ 100-25 MG PO TABS: 1.0000 | ORAL_TABLET | Freq: Every day | ORAL | 3 refills | Status: DC

## 2022-11-16 MED ORDER — GLIMEPIRIDE 2 MG PO TABS: 2.0000 mg | ORAL_TABLET | Freq: Every day | ORAL | 3 refills | Status: DC

## 2022-11-16 MED ORDER — GABAPENTIN 600 MG PO TABS: 600.0000 mg | ORAL_TABLET | Freq: Three times a day (TID) | ORAL | 3 refills | Status: DC

## 2022-11-16 NOTE — Assessment & Plan Note (Signed)
Patient agreeable to colonoscopy.

## 2022-11-16 NOTE — Assessment & Plan Note (Signed)
Chronic, LDL goal <70 Repeat LP Continues on pravastain 40 mg

## 2022-11-16 NOTE — Assessment & Plan Note (Signed)
Chronic, with flare x1-2 weeks C/o ear congestion on R side and feeling "off balance like I'm drunk" Recommend treatment

## 2022-11-16 NOTE — Assessment & Plan Note (Addendum)
Chronic, uncontrolled with worsening neuropathy; will increase gaba from 300 mg TID to 600 mg TID Continue to recommend balanced, lower carb meals. Smaller meal size, adding snacks. Choosing water as drink of choice and increasing purposeful exercise. Due to for DM eye exam

## 2022-11-16 NOTE — Patient Instructions (Signed)
Your symptoms and exam findings are most consistent with a viral upper respiratory infection. These usually run their course in 5-7 days. Unfortunately, antibiotics don't work against viruses and just increase your risk of other issues such as diarrhea, yeast infections, and resistant infections.  If you start feeling worse with facial pain, high fever, cough, shortness of breath or start feeling significantly worse, please call us right away to be further evaluated.  Some things that can make you feel better are: - Increased rest - Increasing Fluids - Acetaminophen / ibuprofen as needed for fever/pain.  - Salt water gargling, chloraseptic spray and throat lozenges - OTC pseudoephedrine.  - Mucinex.  - Saline sinus flushes or a neti pot.  - Humidifying the air.  

## 2022-11-16 NOTE — Progress Notes (Signed)
Established patient visit   Patient: Patrick Grant   DOB: April 17, 1955   67 y.o. Male  MRN: [id number] Visit Date: 11/16/2022  Today's healthcare provider: Jacky Kindle, FNP  Introduced to nurse practitioner role and practice setting.  All questions answered.  Discussed provider/patient relationship and expectations.  Subjective    HPI HPI     Medical Management of Chronic Issues    Additional comments: -No symptoms -Occasional low ready once every 5-6 months cause fatigue and anxious feeling -Aware to update eye exam      Last edited by Acey Lav, CMA on 11/16/2022 10:52 AM.      Problem List Items Addressed This Visit       Cardiovascular and Mediastinum   Hypertension associated with diabetes (HCC)    Chronic, uncontrolled Recommend titration from lisinopril 10 to hyzaar 100-25 1 month follow up recommended Goal 119/79      Relevant Medications   glimepiride (AMARYL) 2 MG tablet   losartan-hydrochlorothiazide (HYZAAR) 100-25 MG tablet   Other Relevant Orders   TSH   Hemoglobin A1c     Respiratory   Acute bronchitis with chronic obstructive pulmonary disease (COPD) (HCC) - Primary    Chronic, with flare x1-2 weeks C/o ear congestion on R side and feeling "off balance like I'm drunk" Recommend treatment       Relevant Medications   predniSONE (DELTASONE) 10 MG tablet   amoxicillin-clavulanate (AUGMENTIN) 875-125 MG tablet     Endocrine   Hyperlipidemia associated with type 2 diabetes mellitus (HCC)    Chronic, LDL goal <70 Repeat LP Continues on pravastain 40 mg      Relevant Medications   glimepiride (AMARYL) 2 MG tablet   losartan-hydrochlorothiazide (HYZAAR) 100-25 MG tablet   Other Relevant Orders   Hemoglobin A1c   Lipid panel   Type 2 diabetes mellitus (HCC)    Chronic, uncontrolled with worsening neuropathy; will increase gaba from 300 mg TID to 600 mg TID Continue to recommend balanced, lower carb meals. Smaller meal size,  adding snacks. Choosing water as drink of choice and increasing purposeful exercise. Due to for DM eye exam       Relevant Medications   gabapentin (NEURONTIN) 600 MG tablet   glimepiride (AMARYL) 2 MG tablet   losartan-hydrochlorothiazide (HYZAAR) 100-25 MG tablet   Other Relevant Orders   Urine microalbumin-creatinine with uACR   POCT HgB A1C (Completed)   CBC with Differential/Platelet   Comprehensive Metabolic Panel (CMET)   Hemoglobin A1c     Other   Colon cancer screening    Patient agreeable to colonoscopy       Relevant Orders   Ambulatory referral to Gastroenterology     Medications: Outpatient Medications Prior to Visit  Medication Sig Note   metFORMIN (GLUCOPHAGE) 1000 MG tablet TAKE 1 TABLET TWICE DAILY WITH MEALS    ONETOUCH ULTRA test strip USE 1 STRIP TO CHECK GLUCOSE ONCE DAILY    pravastatin (PRAVACHOL) 40 MG tablet TAKE 1 TABLET EVERY DAY    [DISCONTINUED] albuterol (VENTOLIN HFA) 108 (90 Base) MCG/ACT inhaler Inhale 2 puffs into the lungs every 6 (six) hours as needed for wheezing or shortness of breath.    [DISCONTINUED] empagliflozin (JARDIANCE) 25 MG TABS tablet Take 1 tablet (25 mg total) by mouth daily. 11/16/2022: c/o dizziness   [DISCONTINUED] FLUZONE QUADRIVALENT 0.5 ML injection     [DISCONTINUED] gabapentin (NEURONTIN) 300 MG capsule Take 1 capsule (300 mg total) by mouth 3 (three) times daily.    [  DISCONTINUED] glimepiride (AMARYL) 2 MG tablet TAKE 1 TABLET (2 MG TOTAL) BY MOUTH DAILY BEFORE BREAKFAST.    [DISCONTINUED] hydrocortisone 2.5 % lotion Apply topically daily. Tuesday, Thursday, Saturday    [DISCONTINUED] ketoconazole (NIZORAL) 2 % cream Apply 1 application topically daily. Monday, Wednesday, Friday    [DISCONTINUED] lisinopril (ZESTRIL) 10 MG tablet TAKE 1 TABLET EVERY DAY    [DISCONTINUED] mupirocin ointment (BACTROBAN) 2 % Apply 1 application topically daily. Qd to excision site    [DISCONTINUED] nitroGLYCERIN (NITROSTAT) 0.4 MG SL  tablet Place 1 tablet (0.4 mg total) under the tongue every 5 (five) minutes as needed.    [DISCONTINUED] tiotropium (SPIRIVA HANDIHALER) 18 MCG inhalation capsule Place 1 capsule (18 mcg total) into inhaler and inhale daily.    No facility-administered medications prior to visit.    Review of Systems    Objective    BP (!) 161/80 (BP Location: Left Arm, Patient Position: Sitting, Cuff Size: Normal)   Pulse (!) 102   Wt 207 lb (93.9 kg)   SpO2 97%   BMI 26.58 kg/m   Physical Exam Vitals and nursing note reviewed.  Constitutional:      Appearance: Normal appearance. He is overweight.  HENT:     Head: Normocephalic and atraumatic.     Right Ear: Tympanic membrane, ear canal and external ear normal.     Left Ear: Tympanic membrane, ear canal and external ear normal.     Nose: Congestion present.     Mouth/Throat:     Mouth: Mucous membranes are moist.     Pharynx: Oropharynx is clear. Posterior oropharyngeal erythema present.  Eyes:     Extraocular Movements: Extraocular movements intact.     Conjunctiva/sclera: Conjunctivae normal.  Cardiovascular:     Rate and Rhythm: Normal rate and regular rhythm.     Pulses: Normal pulses.     Heart sounds: Normal heart sounds.  Pulmonary:     Effort: Pulmonary effort is normal.     Breath sounds: Wheezing present.  Abdominal:     General: Bowel sounds are normal.     Palpations: Abdomen is soft.  Musculoskeletal:        General: Normal range of motion.     Cervical back: Normal range of motion.  Skin:    General: Skin is warm and dry.     Capillary Refill: Capillary refill takes less than 2 seconds.  Neurological:     Mental Status: He is alert and oriented to person, place, and time. Mental status is at baseline.     Motor: Weakness present.     Gait: Gait abnormal.  Psychiatric:        Mood and Affect: Mood normal.        Behavior: Behavior normal.        Thought Content: Thought content normal.        Judgment: Judgment  normal.      Results for orders placed or performed in visit on 11/16/22  POCT HgB A1C  Result Value Ref Range   Hemoglobin A1C 7.1 (A) 4.0 - 5.6 %   HbA1c POC (<> result, manual entry)     HbA1c, POC (prediabetic range)     HbA1c, POC (controlled diabetic range)      Assessment & Plan     Problem List Items Addressed This Visit       Cardiovascular and Mediastinum   Hypertension associated with diabetes (HCC)    Chronic, uncontrolled Recommend titration from lisinopril 10 to hyzaar  100-25 1 month follow up recommended Goal 119/79      Relevant Medications   glimepiride (AMARYL) 2 MG tablet   losartan-hydrochlorothiazide (HYZAAR) 100-25 MG tablet   Other Relevant Orders   TSH   Hemoglobin A1c     Respiratory   Acute bronchitis with chronic obstructive pulmonary disease (COPD) (HCC) - Primary    Chronic, with flare x1-2 weeks C/o ear congestion on R side and feeling "off balance like I'm drunk" Recommend treatment       Relevant Medications   predniSONE (DELTASONE) 10 MG tablet   amoxicillin-clavulanate (AUGMENTIN) 875-125 MG tablet     Endocrine   Hyperlipidemia associated with type 2 diabetes mellitus (HCC)    Chronic, LDL goal <70 Repeat LP Continues on pravastain 40 mg      Relevant Medications   glimepiride (AMARYL) 2 MG tablet   losartan-hydrochlorothiazide (HYZAAR) 100-25 MG tablet   Other Relevant Orders   Hemoglobin A1c   Lipid panel   Type 2 diabetes mellitus (HCC)    Chronic, uncontrolled with worsening neuropathy; will increase gaba from 300 mg TID to 600 mg TID Continue to recommend balanced, lower carb meals. Smaller meal size, adding snacks. Choosing water as drink of choice and increasing purposeful exercise. Due to for DM eye exam       Relevant Medications   gabapentin (NEURONTIN) 600 MG tablet   glimepiride (AMARYL) 2 MG tablet   losartan-hydrochlorothiazide (HYZAAR) 100-25 MG tablet   Other Relevant Orders   Urine  microalbumin-creatinine with uACR   POCT HgB A1C (Completed)   CBC with Differential/Platelet   Comprehensive Metabolic Panel (CMET)   Hemoglobin A1c     Other   Colon cancer screening    Patient agreeable to colonoscopy       Relevant Orders   Ambulatory referral to Gastroenterology   No follow-ups on file.     Leilani Merl, FNP, have reviewed all documentation for this visit. The documentation on 11/16/22 for the exam, diagnosis, procedures, and orders are all accurate and complete.  Jacky Kindle, FNP  [facility] [phone] (phone) [phone] (fax)  [facility]

## 2022-11-16 NOTE — Assessment & Plan Note (Signed)
Chronic, uncontrolled Recommend titration from lisinopril 10 to hyzaar 100-25 1 month follow up recommended Goal 119/79

## 2022-11-17 ENCOUNTER — Other Ambulatory Visit: Payer: Self-pay | Admitting: Family Medicine

## 2022-11-17 LAB — CBC WITH DIFFERENTIAL/PLATELET
Basophils Absolute: 0 10*3/uL (ref 0.0–0.2)
Basos: 0 %
EOS (ABSOLUTE): 0.1 10*3/uL (ref 0.0–0.4)
Eos: 1 %
Hematocrit: 46.2 % (ref 37.5–51.0)
Hemoglobin: 15.5 g/dL (ref 13.0–17.7)
Immature Grans (Abs): 0 10*3/uL (ref 0.0–0.1)
Immature Granulocytes: 0 %
Lymphocytes Absolute: 2 10*3/uL (ref 0.7–3.1)
Lymphs: 20 %
MCH: 30.5 pg (ref 26.6–33.0)
MCHC: 33.5 g/dL (ref 31.5–35.7)
MCV: 91 fL (ref 79–97)
Monocytes Absolute: 0.5 10*3/uL (ref 0.1–0.9)
Monocytes: 5 %
Neutrophils Absolute: 7.4 10*3/uL — ABNORMAL HIGH (ref 1.4–7.0)
Neutrophils: 74 %
Platelets: 374 10*3/uL (ref 150–450)
RBC: 5.09 x10E6/uL (ref 4.14–5.80)
RDW: 12.5 % (ref 11.6–15.4)
WBC: 10.1 10*3/uL (ref 3.4–10.8)

## 2022-11-17 LAB — HEMOGLOBIN A1C
Est. average glucose Bld gHb Est-mCnc: 171 mg/dL
Hgb A1c MFr Bld: 7.6 % — ABNORMAL HIGH (ref 4.8–5.6)

## 2022-11-17 LAB — LIPID PANEL
Chol/HDL Ratio: 4 ratio (ref 0.0–5.0)
Cholesterol, Total: 167 mg/dL (ref 100–199)
HDL: 42 mg/dL (ref >39)
LDL Chol Calc (NIH): 101 mg/dL — ABNORMAL HIGH (ref 0–99)
Triglycerides: 137 mg/dL (ref 0–149)
VLDL Cholesterol Cal: 24 mg/dL (ref 5–40)

## 2022-11-17 LAB — COMPREHENSIVE METABOLIC PANEL
ALT: 15 IU/L (ref 0–44)
AST: 14 IU/L (ref 0–40)
Albumin: 4.6 g/dL (ref 3.9–4.9)
Alkaline Phosphatase: 79 IU/L (ref 44–121)
BUN/Creatinine Ratio: 11 (ref 10–24)
BUN: 11 mg/dL (ref 8–27)
Bilirubin Total: 0.4 mg/dL (ref 0.0–1.2)
CO2: 22 mmol/L (ref 20–29)
Calcium: 9.9 mg/dL (ref 8.6–10.2)
Chloride: 101 mmol/L (ref 96–106)
Creatinine, Ser: 0.99 mg/dL (ref 0.76–1.27)
Globulin, Total: 2.3 g/dL (ref 1.5–4.5)
Glucose: 141 mg/dL — ABNORMAL HIGH (ref 70–99)
Potassium: 4.4 mmol/L (ref 3.5–5.2)
Sodium: 137 mmol/L (ref 134–144)
Total Protein: 6.9 g/dL (ref 6.0–8.5)
eGFR: 85 mL/min/{1.73_m2} (ref >59)

## 2022-11-17 LAB — TSH: TSH: 1.02 u[IU]/mL (ref 0.450–4.500)

## 2022-11-17 MED ORDER — ROSUVASTATIN CALCIUM 20 MG PO TABS: 20.0000 mg | ORAL_TABLET | Freq: Every day | ORAL | 3 refills | Status: DC

## 2022-12-03 ENCOUNTER — Encounter: Payer: Self-pay | Admitting: *Deleted

## 2022-12-14 ENCOUNTER — Ambulatory Visit: Payer: Medicare HMO | Admitting: Family Medicine

## 2022-12-26 ENCOUNTER — Other Ambulatory Visit: Payer: Self-pay | Admitting: Physician Assistant

## 2022-12-26 DIAGNOSIS — E1100 Type 2 diabetes mellitus with hyperosmolarity without nonketotic hyperglycemic-hyperosmolar coma (NKHHC): Secondary | ICD-10-CM

## 2022-12-26 DIAGNOSIS — E1140 Type 2 diabetes mellitus with diabetic neuropathy, unspecified: Secondary | ICD-10-CM

## 2022-12-27 ENCOUNTER — Ambulatory Visit: Payer: Medicare HMO | Admitting: Family Medicine

## 2022-12-28 ENCOUNTER — Encounter: Payer: Self-pay | Admitting: Family Medicine

## 2022-12-28 ENCOUNTER — Ambulatory Visit (INDEPENDENT_AMBULATORY_CARE_PROVIDER_SITE_OTHER): Payer: Medicare HMO | Admitting: Family Medicine

## 2022-12-28 VITALS — BP 150/92 | HR 99 | Ht 73.0 in | Wt 209.3 lb

## 2022-12-28 DIAGNOSIS — E1169 Type 2 diabetes mellitus with other specified complication: Secondary | ICD-10-CM

## 2022-12-28 DIAGNOSIS — E1140 Type 2 diabetes mellitus with diabetic neuropathy, unspecified: Secondary | ICD-10-CM

## 2022-12-28 DIAGNOSIS — E1159 Type 2 diabetes mellitus with other circulatory complications: Secondary | ICD-10-CM | POA: Diagnosis not present

## 2022-12-28 DIAGNOSIS — I152 Hypertension secondary to endocrine disorders: Secondary | ICD-10-CM | POA: Diagnosis not present

## 2022-12-28 DIAGNOSIS — E785 Hyperlipidemia, unspecified: Secondary | ICD-10-CM | POA: Diagnosis not present

## 2022-12-28 DIAGNOSIS — Z7984 Long term (current) use of oral hypoglycemic drugs: Secondary | ICD-10-CM

## 2022-12-28 MED ORDER — AMLODIPINE BESYLATE 5 MG PO TABS: 5.0000 mg | ORAL_TABLET | Freq: Every day | ORAL | 3 refills | Status: DC

## 2022-12-28 NOTE — Patient Instructions (Signed)
Given elevated blood pressure readings with exertion; addition of 5 mg of Norvasc to current Hyzaar 100-25

## 2022-12-28 NOTE — Progress Notes (Signed)
Established patient visit   Patient: Patrick Grant   DOB: [birth year]   67 y.o. Male  MRN: [id number] Visit Date: 12/28/2022  Today's healthcare provider: Jacky Kindle, FNP  Introduced to nurse practitioner role and practice setting.  All questions answered.  Discussed provider/patient relationship and expectations.  Subjective    HPI HPI     Follow-up    Additional comments: HTN f/u       Last edited by Clois Comber on 12/28/2022 11:20 AM.     The patient, with a history of hypertension, diabetes, and hyperlipidemia, presents with worsening neuropathic leg pain, particularly when walking. The pain, described as 'like someone sticking me in the bottom of my foot with an ice pick,' is associated with a sensation of electrical shock radiating from the foot to the hip, followed by burning and throbbing. The pain is worse at night and during the colder months, causing significant distress and anxiety. The patient reports that gabapentin provides some relief, but does not completely alleviate the symptoms.  The patient's blood pressure has been 'fairly normal' at home, difficult for him to recall but he believes his readings are in the 110s to 120s, but tends to be elevated during office visits, which the patient attributes to the pain and stress of walking to the office. The patient also reports that his hands and feet sweat excessively when the leg pain is severe.  The patient's diabetes control has worsened, with a recent increase in HbA1c from 6.6 to 7.6. The patient also has elevated cholesterol, with an LDL of 101, despite the goal being less than 70 due to his diabetes.  Medications: Outpatient Medications Prior to Visit  Medication Sig   gabapentin (NEURONTIN) 600 MG tablet Take 1 tablet (600 mg total) by mouth 3 (three) times daily.   glimepiride (AMARYL) 2 MG tablet TAKE 1 TABLET (2 MG TOTAL) BY MOUTH DAILY BEFORE BREAKFAST.   losartan-hydrochlorothiazide (HYZAAR)  100-25 MG tablet Take 1 tablet by mouth daily.   metFORMIN (GLUCOPHAGE) 1000 MG tablet TAKE 1 TABLET TWICE DAILY WITH MEALS   rosuvastatin (CRESTOR) 20 MG tablet Take 1 tablet (20 mg total) by mouth daily.   [DISCONTINUED] amoxicillin-clavulanate (AUGMENTIN) 875-125 MG tablet Take 1 tablet by mouth 2 (two) times daily.   [DISCONTINUED] ONETOUCH ULTRA test strip USE 1 STRIP TO CHECK GLUCOSE ONCE DAILY   [DISCONTINUED] predniSONE (DELTASONE) 10 MG tablet Take 5 tablets (50 mg total) by mouth daily with breakfast.   No facility-administered medications prior to visit.     Objective    BP (!) 150/93 (BP Location: Right Arm, Patient Position: Sitting, Cuff Size: Normal)   Pulse 99   Ht 6\' 1"  (1.854 m)   Wt 209 lb 4.8 oz (94.9 kg)   SpO2 99%   BMI 27.61 kg/m   Physical Exam Vitals and nursing note reviewed.  Constitutional:      Appearance: Normal appearance. He is obese.  HENT:     Head: Normocephalic and atraumatic.  Cardiovascular:     Rate and Rhythm: Normal rate and regular rhythm.     Pulses: Normal pulses.     Heart sounds: Normal heart sounds.  Pulmonary:     Effort: Pulmonary effort is normal.     Breath sounds: Normal breath sounds.  Musculoskeletal:        General: Normal range of motion.     Cervical back: Normal range of motion.  Skin:    General: Skin is warm  and dry.     Capillary Refill: Capillary refill takes less than 2 seconds.  Neurological:     General: No focal deficit present.     Mental Status: He is alert and oriented to person, place, and time. Mental status is at baseline.  Psychiatric:        Mood and Affect: Mood normal.        Behavior: Behavior normal.        Thought Content: Thought content normal.        Judgment: Judgment normal.     No results found for any visits on 12/28/22.  Assessment & Plan     Problem List Items Addressed This Visit       Cardiovascular and Mediastinum   Hypertension associated with diabetes (HCC) - Primary    Relevant Medications   amLODipine (NORVASC) 5 MG tablet     Endocrine   Hyperlipidemia associated with type 2 diabetes mellitus (HCC)   Relevant Medications   amLODipine (NORVASC) 5 MG tablet   Type 2 diabetes mellitus (HCC)   Relevant Orders   Ambulatory referral to Pain Clinic  Hypertension Elevated office blood pressure, but reported normal readings at home. Possible white coat hypertension. Add on Amlodipine 5mg  to previously prescribed Losartan/Hydrochlorothiazide 100/25mg . -Recheck blood pressure at next visit.  Diabetes Mellitus A1c increased from 6.6 to 7.6. Currently on Glimepiride 2 mg daily and Metformin 1000 mg BID -Continue current antidiabetic regimen. -Check A1c and urine labs at next visit.  Hyperlipidemia LDL elevated at 101 (goal <78 for diabetics). Recently started on new cholesterol medication, Crestpr 20 -No complaints with new cholesterol medication. -Recheck lipid panel at next visit.  Peripheral Neuropathy Reports worsening pain in leg, particularly with walking. Currently on Gabapentin. -Continue Gabapentin. -Encourage blood sugar control and weight management to help manage symptoms. -referral to pain mgmt for further asssitance  Colon Cancer Screening Last colonoscopy in February 2021 with polyps found. Recommended 3-year follow-up due to polyp findings. -Discussed importance of follow-up colonoscopy for early cancer detection. -Patient currently undecided about scheduling colonoscopy; referral already placed  Diabetic Retinopathy Screening Eye exam scheduled for 01/17/2023. -Encourage patient to attend scheduled eye exam.  No follow-ups on file.     Leilani Merl, FNP, have reviewed all documentation for this visit. The documentation on 12/28/22 for the exam, diagnosis, procedures, and orders are all accurate and complete.  Jacky Kindle, FNP  [facility] [phone] (phone) [phone] (fax)  [facility]

## 2023-01-14 ENCOUNTER — Other Ambulatory Visit: Payer: Self-pay | Admitting: Pharmacist

## 2023-01-14 NOTE — Progress Notes (Signed)
01/14/2023 Name: Patrick Grant MRN: [id number] DOB: 1955/10/25  Chief Complaint  Patient presents with   Diabetes   Hypertension   Hyperlipidemia    EDMUND WHELIHAN is a 67 y.o. year old male who presented for a telephone visit.   They were referred to the pharmacist by  [facility] prior  for assistance in managing diabetes, hypertension, and hyperlipidemia.    Subjective:  Care Team: Primary Care Provider: Jacky Kindle, FNP ; Next Scheduled Visit: 02/14/23 Clinical Pharmacist: Marlowe Aschoff, PharmD  Medication Access/Adherence  Current Pharmacy:  [facility] - [place], [place] - [address] [place] [address] Phone: [phone] Fax: [phone]  [facility] [place], [place] - [address] [place] [address] Phone: [phone] Fax: [phone]   Patient reports affordability concerns with their medications: No  Patient reports access/transportation concerns to their pharmacy: No  Patient reports adherence concerns with their medications:  No     Diabetes:  Current medications: Metformin 1000mg  twice a day, Glimepiride 2mg  daily Medications tried in the past: Jardiance 25mg  (dizziness); Farxiga NOT covered by insurance   Using Accu-Chek meter; testing 1 times daily- has NOT been checking recently   Patient denies hypoglycemic s/sx including dizziness, shakiness, sweating. Patient denies hyperglycemic symptoms including polyuria, polydipsia, polyphagia, nocturia, neuropathy, blurred vision.  Cannot determine any changes in meals that have been adding to increased A1c seen   Current medication access support: [employer]  Hypertension:  Current medications: Amlodipine 5mg  daily, Losartan-hydrochlorothiazide 100-25mg  daily Medications previously tried: None  Patient has a validated, automated, upper arm home BP cuff Current blood  pressure readings readings: Has NOT been checking recently  Patient denies hypotensive s/sx including dizziness, lightheadedness.  Patient denies hypertensive symptoms including headache, chest pain, shortness of breath  - Drinks: Water, coffee (three 16 oz cups in the morning) + black tea (two 16 oz cups in evenings)    Hyperlipidemia/ASCVD Risk Reduction  Current lipid lowering medications: Rosuvastatin 20mg  Medications tried in the past: Pravastatin (switched for better efficacy)  Antiplatelet regimen: None  ASCVD History: None Family History: Mother and father CVA   The 10-year ASCVD risk score (Arnett DK, et al., 2019) is: 32.9%   Values used to calculate the score:     Age: 84 years     Sex: Male     Is Non-Hispanic African American: No     Diabetic: Yes     Tobacco smoker: No     Systolic Blood Pressure: 150 mmHg     Is BP treated: Yes     HDL Cholesterol: 42 mg/dL     Total Cholesterol: 167 mg/dL    Objective:  Lab Results  Component Value Date   HGBA1C 7.6 (H) 11/16/2022    Lab Results  Component Value Date   CREATININE 0.99 11/16/2022   BUN 11 11/16/2022   NA 137 11/16/2022   K 4.4 11/16/2022   CL 101 11/16/2022   CO2 22 11/16/2022    Lab Results  Component Value Date   CHOL 167 11/16/2022   HDL 42 11/16/2022   LDLCALC 101 (H) 11/16/2022   TRIG 137 11/16/2022   CHOLHDL 4.0 11/16/2022    Medications Reviewed Today     Reviewed by Pollie Friar, RPH (Pharmacist) on 01/14/23 at 1326  Med List Status: <None>   Medication Order Taking? Sig Documenting Provider Last Dose Status Informant  amLODipine (  NORVASC) 5 MG tablet [id number] Yes Take 1 tablet (5 mg total) by mouth daily. Take in addition to Hyzaar Jacky Kindle, FNP Taking Active   gabapentin (NEURONTIN) 600 MG tablet [id number] Yes Take 1 tablet (600 mg total) by mouth 3 (three) times daily. Jacky Kindle, FNP Taking Active            Med Note Lorenso Courier, Marjory Lies   Mon Jan 14, 2023  1:26  PM) Only uses as needed for nerve pain  glimepiride (AMARYL) 2 MG tablet [id number] Yes TAKE 1 TABLET (2 MG TOTAL) BY MOUTH DAILY BEFORE BREAKFAST. Jacky Kindle, FNP Taking Active   losartan-hydrochlorothiazide [facility]) 100-25 MG tablet [id number] Yes Take 1 tablet by mouth daily. Merita Norton T, FNP Taking Active   metFORMIN (GLUCOPHAGE) 1000 MG tablet [id number] Yes TAKE 1 TABLET TWICE DAILY WITH MEALS Jacky Kindle, FNP Taking Active   rosuvastatin (CRESTOR) 20 MG tablet [id number] Yes Take 1 tablet (20 mg total) by mouth daily. Jacky Kindle, FNP Taking Active               Assessment/Plan:   Diabetes: - Currently uncontrolled - Reviewed long term cardiovascular and renal outcomes of uncontrolled blood sugar - Reviewed goal A1c, goal fasting, and goal 2 hour post prandial glucose - Recommend checking BG 1x per day as able    Hypertension: - Currently uncontrolled - Reviewed long term cardiovascular and renal outcomes of uncontrolled blood pressure - Reviewed appropriate blood pressure monitoring technique and reviewed goal blood pressure. Recommended to check home blood pressure and heart rate daily - Recommend to decrease amount of caffeinated drinks in a day if possible      Hyperlipidemia/ASCVD Risk Reduction: - Currently uncontrolled.  - Reviewed long term complications of uncontrolled cholesterol - LDL goal <70 due to DM   **Follow Up Plan:**  - Follow-up scheduled for 2 weeks to get a BP and BG reading- has NOT been checking recently - For future, if BG is elevated, may need to start Pioglitazone 15mg  daily - Reviewed option to switch metformin to XR formulation and take everything in the morning only for easier adherence- patient opted out of change for now (reports having missed about 1 week of second dose of metformin in pill box in kitchen)  **Of note, patient reports to be in significant amount of pain but refuses any other medications other than Gabapentin  for nerve pain due to history of narcotic use causing severe sedation + weight gain- believe pain related to season may be cause for elevated A1c seen  **Has a friend Boyd Kerbs) who helps him with things like groceries due to severe pain  Marlowe Aschoff, PharmD [facility] Phone Number: [phone]

## 2023-01-28 ENCOUNTER — Other Ambulatory Visit: Payer: Medicare HMO | Admitting: Pharmacist

## 2023-01-28 NOTE — Progress Notes (Signed)
01/28/2023 Name: Patrick Grant MRN: [id number] DOB: 1955/05/26  Chief Complaint  Patient presents with   Hypertension   Diabetes    Patrick Grant is a 67 y.o. year old male who presented for a telephone visit.   They were referred to the pharmacist by  [facility] prior  for assistance in managing diabetes, hypertension, and hyperlipidemia.    Subjective:  Care Team: Primary Care Provider: Jacky Kindle, FNP ; Next Scheduled Visit: 02/14/23 Clinical Pharmacist: Marlowe Aschoff, PharmD  Medication Access/Adherence  Current Pharmacy:  [facility] - [place], [place] - [address] [place] [address] Phone: [phone] Fax: [phone]  [facility] [place], [place] - [address] [place] [address] Phone: [phone] Fax: [phone]   Patient reports affordability concerns with their medications: No  Patient reports access/transportation concerns to their pharmacy: No  Patient reports adherence concerns with their medications:  No     Diabetes:  Current medications: Metformin 1000mg  twice a day, Glimepiride 2mg  daily Medications tried in the past: Jardiance 25mg  (dizziness); Farxiga NOT covered by insurance   Using Accu-Chek meter; testing 1 times daily- has NOT been checking recently For 11/4 visit: Reports it was 108 in the morning and 140 in the afternoon   Patient denies hypoglycemic s/sx including dizziness, shakiness, sweating. Patient denies hyperglycemic symptoms including polyuria, polydipsia, polyphagia, nocturia, neuropathy, blurred vision.  Cannot determine any changes in meals that have been adding to increased A1c seen   Current medication access support: [employer]  Hypertension:  Current medications: Amlodipine 5mg  daily, Losartan-hydrochlorothiazide 100-25mg  daily Medications previously tried: Lisinopril 10mg  (lack of  efficacy)  Patient has a validated, automated, upper arm home BP cuff Current blood pressure readings readings: Has NOT been checking recently  Patient denies hypotensive s/sx including dizziness, lightheadedness.  Patient denies hypertensive symptoms including headache, chest pain, shortness of breath  - Drinks: Water, coffee (three 16 oz cups in the morning) + black tea (two 16 oz cups in evenings)    Hyperlipidemia/ASCVD Risk Reduction  Current lipid lowering medications: Rosuvastatin 20mg  Medications tried in the past: Pravastatin (switched for better efficacy)  Antiplatelet regimen: None  ASCVD History: None Family History: Mother and father CVA   The 10-year ASCVD risk score (Arnett DK, et al., 2019) is: 32.9%   Values used to calculate the score:     Age: 35 years     Sex: Male     Is Non-Hispanic African American: No     Diabetic: Yes     Tobacco smoker: No     Systolic Blood Pressure: 150 mmHg     Is BP treated: Yes     HDL Cholesterol: 42 mg/dL     Total Cholesterol: 167 mg/dL    Objective:  Lab Results  Component Value Date   HGBA1C 7.6 (H) 11/16/2022    Lab Results  Component Value Date   CREATININE 0.99 11/16/2022   BUN 11 11/16/2022   NA 137 11/16/2022   K 4.4 11/16/2022   CL 101 11/16/2022   CO2 22 11/16/2022    Lab Results  Component Value Date   CHOL 167 11/16/2022   HDL 42 11/16/2022   LDLCALC 101 (H) 11/16/2022   TRIG 137 11/16/2022   CHOLHDL 4.0 11/16/2022    Medications Reviewed Today     Reviewed by Pollie Friar, RPH (Pharmacist) on 01/28/23 at 0912  Med List  Status: <None>   Medication Order Taking? Sig Documenting Provider Last Dose Status Informant  amLODipine (NORVASC) 5 MG tablet [id number] Yes Take 1 tablet (5 mg total) by mouth daily. Take in addition to Hyzaar Jacky Kindle, FNP Taking Active   gabapentin (NEURONTIN) 600 MG tablet [id number] Yes Take 1 tablet (600 mg total) by mouth 3 (three) times daily. Jacky Kindle, FNP Taking Active            Med Note Lorenso Courier, Marjory Lies   Mon Jan 14, 2023  1:26 PM) Only uses as needed for nerve pain  glimepiride (AMARYL) 2 MG tablet [id number] Yes TAKE 1 TABLET (2 MG TOTAL) BY MOUTH DAILY BEFORE BREAKFAST. Jacky Kindle, FNP Taking Active   losartan-hydrochlorothiazide [facility]) 100-25 MG tablet [id number] Yes Take 1 tablet by mouth daily. Merita Norton T, FNP Taking Active   metFORMIN (GLUCOPHAGE) 1000 MG tablet [id number] Yes TAKE 1 TABLET TWICE DAILY WITH MEALS Jacky Kindle, FNP Taking Active   rosuvastatin (CRESTOR) 20 MG tablet [id number] Yes Take 1 tablet (20 mg total) by mouth daily. Jacky Kindle, FNP Taking Active               Assessment/Plan:   Diabetes: - Currently uncontrolled - Reviewed long term cardiovascular and renal outcomes of uncontrolled blood sugar - Reviewed goal A1c, goal fasting, and goal 2 hour post prandial glucose - Recommend checking BG 1x per day as able    Hypertension: - Currently uncontrolled - Reviewed long term cardiovascular and renal outcomes of uncontrolled blood pressure - Reviewed appropriate blood pressure monitoring technique and reviewed goal blood pressure. Recommended to check home blood pressure and heart rate daily - Recommend to decrease amount of caffeinated drinks in a day if possible     Hyperlipidemia/ASCVD Risk Reduction: - Currently uncontrolled.  - Reviewed long term complications of uncontrolled cholesterol - LDL goal <70 due to DM   **Follow Up Plan:**  - No follow-up scheduled- unable to get any BP readings from the patient to assess for medication changes - For future, if BG is elevated, may need to start Pioglitazone 15mg  daily or Tradjenta/Ozempic/Trulicity ($45 each month) - For future, if BP is elevated, start Toprol XL 25mg  daily - Reviewed option to switch metformin to XR formulation and take everything in the morning only for easier adherence- patient opted out of change for now  (reports having missed about 1 week of second dose of metformin in pill box in kitchen)  **Of note, patient reports to be in significant amount of pain but refuses any other medications other than Gabapentin for nerve pain due to history of narcotic use causing severe sedation + weight gain- believe pain related to season may be cause for elevated A1c and BP seen  **Has a friend Boyd Kerbs) who helps him with things like groceries due to severe pain  Marlowe Aschoff, PharmD [facility] Phone Number: [phone]

## 2023-02-14 ENCOUNTER — Encounter: Payer: Self-pay | Admitting: Family Medicine

## 2023-02-14 ENCOUNTER — Ambulatory Visit: Payer: Medicare HMO | Admitting: Family Medicine

## 2023-02-14 ENCOUNTER — Ambulatory Visit: Payer: Self-pay

## 2023-02-14 ENCOUNTER — Ambulatory Visit
Admission: RE | Admit: 2023-02-14 | Discharge: 2023-02-14 | Disposition: A | Discharge status: Home / Self Care | Payer: Medicare HMO | Source: Ambulatory Visit | Attending: Family Medicine | Admitting: Family Medicine

## 2023-02-14 VITALS — BP 150/87 | HR 100 | Resp 16 | Ht 73.0 in | Wt 205.0 lb

## 2023-02-14 DIAGNOSIS — M79605 Pain in left leg: Secondary | ICD-10-CM | POA: Insufficient documentation

## 2023-02-14 DIAGNOSIS — M7989 Other specified soft tissue disorders: Secondary | ICD-10-CM | POA: Insufficient documentation

## 2023-02-14 DIAGNOSIS — E1140 Type 2 diabetes mellitus with diabetic neuropathy, unspecified: Secondary | ICD-10-CM

## 2023-02-14 DIAGNOSIS — Z23 Encounter for immunization: Secondary | ICD-10-CM

## 2023-02-14 DIAGNOSIS — I739 Peripheral vascular disease, unspecified: Secondary | ICD-10-CM

## 2023-02-14 DIAGNOSIS — Z1211 Encounter for screening for malignant neoplasm of colon: Secondary | ICD-10-CM

## 2023-02-14 DIAGNOSIS — E1159 Type 2 diabetes mellitus with other circulatory complications: Secondary | ICD-10-CM | POA: Diagnosis not present

## 2023-02-14 DIAGNOSIS — I152 Hypertension secondary to endocrine disorders: Secondary | ICD-10-CM | POA: Diagnosis not present

## 2023-02-14 DIAGNOSIS — Z7984 Long term (current) use of oral hypoglycemic drugs: Secondary | ICD-10-CM | POA: Diagnosis not present

## 2023-02-14 DIAGNOSIS — R600 Localized edema: Secondary | ICD-10-CM | POA: Diagnosis not present

## 2023-02-14 MED ORDER — GABAPENTIN 600 MG PO TABS: 600.0000 mg | ORAL_TABLET | Freq: Three times a day (TID) | ORAL | 3 refills | Status: DC

## 2023-02-14 MED ORDER — PIOGLITAZONE HCL 15 MG PO TABS: 15.0000 mg | ORAL_TABLET | Freq: Every day | ORAL | 3 refills | Status: DC

## 2023-02-14 MED ORDER — METOPROLOL SUCCINATE ER 25 MG PO TB24: 25.0000 mg | ORAL_TABLET | Freq: Every day | ORAL | 3 refills | Status: DC

## 2023-02-14 NOTE — Telephone Encounter (Signed)
Chief Complaint: Ultrasound results   Disposition: [] ED /[] Urgent Care (no appt availability in office) / [] Appointment(In office/virtual)/ []  [place] [facility]/ [] Home Care/ [] Refused Recommended Disposition /[] [place] [facility]/ [x]  Follow-up with PCP Additional Notes: Morrie Sheldon, Ultrasound Tech called to report ultrasound was   Negative for DVT in left leg. Patient was discharged home since the results were negative.  Reason for Disposition  [1] Caller requesting NON-URGENT health information AND [2] PCP's office is the best resource  Answer Assessment - Initial Assessment Questions 1. REASON FOR CALL or QUESTION: "What is your reason for calling today?" or "How can I best help you?" or "What question do you have that I can help answer?"     Reporting ultrasound results  Protocols used: Information Only Call - No Triage-A-AH

## 2023-02-14 NOTE — Progress Notes (Signed)
Established patient visit   Patient: Patrick Grant   DOB: [birth year]   67 y.o. Male  MRN: [id number] Visit Date: 02/14/2023  Today's healthcare provider: Jacky Kindle, FNP  Re Introduced to nurse practitioner role and practice setting.  All questions answered.  Discussed provider/patient relationship and expectations.   Chief Complaint  Patient presents with   Medical Management of Chronic Issues   Subjective    HPI   The patient, with a history of diabetes, presents with recurrent foot pain and swelling. They describe the pain as spreading up their leg towards their knee. The foot swelling is significant enough to leave an indentation when touched. The patient also reports that their foot turns purple and sweats excessively when in pain. They note that these symptoms are worse in the winter and have been occurring frequently enough that they have come to expect them. Despite the discomfort, they have not sought immediate medical attention because they are "used to it."  The patient's blood sugar has been running high, in the 140s, and their medication, Marcelline Deist, is not covered by their insurance. They have been advised to take Actos and a low dose of glipizide to help manage their blood sugar levels. They also take gabapentin for pain relief, which they find somewhat helpful, especially in the colder months. However, they still wake up at night due to pain and have described the sensation as if someone is wrapping a tourniquet around their leg.  Medications: Outpatient Medications Prior to Visit  Medication Sig   amLODipine (NORVASC) 5 MG tablet Take 1 tablet (5 mg total) by mouth daily. Take in addition to Hyzaar   glimepiride (AMARYL) 2 MG tablet TAKE 1 TABLET (2 MG TOTAL) BY MOUTH DAILY BEFORE BREAKFAST.   losartan-hydrochlorothiazide (HYZAAR) 100-25 MG tablet Take 1 tablet by mouth daily.   metFORMIN (GLUCOPHAGE) 1000 MG tablet TAKE 1 TABLET TWICE DAILY WITH MEALS    rosuvastatin (CRESTOR) 20 MG tablet Take 1 tablet (20 mg total) by mouth daily.   [DISCONTINUED] gabapentin (NEURONTIN) 600 MG tablet Take 1 tablet (600 mg total) by mouth 3 (three) times daily.   No facility-administered medications prior to visit.    Review of Systems Last CBC Lab Results  Component Value Date   WBC 10.1 11/16/2022   HGB 15.5 11/16/2022   HCT 46.2 11/16/2022   MCV 91 11/16/2022   MCH 30.5 11/16/2022   RDW 12.5 11/16/2022   PLT 374 11/16/2022   Last metabolic panel Lab Results  Component Value Date   GLUCOSE 141 (H) 11/16/2022   NA 137 11/16/2022   K 4.4 11/16/2022   CL 101 11/16/2022   CO2 22 11/16/2022   BUN 11 11/16/2022   CREATININE 0.99 11/16/2022   EGFR 85 11/16/2022   CALCIUM 9.9 11/16/2022   PROT 6.9 11/16/2022   ALBUMIN 4.6 11/16/2022   LABGLOB 2.3 11/16/2022   AGRATIO 2.0 01/15/2022   BILITOT 0.4 11/16/2022   ALKPHOS 79 11/16/2022   AST 14 11/16/2022   ALT 15 11/16/2022   ANIONGAP 10 04/29/2016   Last lipids Lab Results  Component Value Date   CHOL 167 11/16/2022   HDL 42 11/16/2022   LDLCALC 101 (H) 11/16/2022   TRIG 137 11/16/2022   CHOLHDL 4.0 11/16/2022   Last hemoglobin A1c Lab Results  Component Value Date   HGBA1C 9.4 (H) 02/14/2023   Last thyroid functions Lab Results  Component Value Date   TSH 1.020 11/16/2022   Last  vitamin D No results found for: "25OHVITD2", "25OHVITD3", "VD25OH" Last vitamin B12 and Folate No results found for: "VITAMINB12", "FOLATE"    Objective    BP (!) 150/87 (BP Location: Right Arm, Patient Position: Sitting, Cuff Size: Large)   Pulse 100   Resp 16   Ht 6\' 1"  (1.854 m)   Wt 205 lb (93 kg)   SpO2 99%   BMI 27.05 kg/m   BP Readings from Last 3 Encounters:  02/14/23 (!) 150/87  12/28/22 (!) 150/92  11/16/22 (!) 161/80   Wt Readings from Last 3 Encounters:  02/14/23 205 lb (93 kg)  12/28/22 209 lb 4.8 oz (94.9 kg)  11/16/22 207 lb (93.9 kg)   SpO2 Readings from Last 3  Encounters:  02/14/23 99%  12/28/22 99%  11/16/22 97%   Physical Exam Vitals and nursing note reviewed.  Constitutional:      Appearance: Normal appearance. He is normal weight.  HENT:     Head: Normocephalic and atraumatic.  Eyes:     Pupils: Pupils are equal, round, and reactive to light.  Cardiovascular:     Rate and Rhythm: Regular rhythm. Tachycardia present.     Pulses:          Dorsalis pedis pulses are 1+ on the right side and 1+ on the left side.       Posterior tibial pulses are 1+ on the right side and 1+ on the left side.     Heart sounds: Normal heart sounds.  Pulmonary:     Effort: Pulmonary effort is normal.     Breath sounds: Normal breath sounds.  Musculoskeletal:        General: Normal range of motion.     Cervical back: Normal range of motion.  Feet:     Comments: Cool, mottled feet; pain with pulse checks Use of crutches at baseline for neuropathy and weakness Skin:    General: Skin is warm and dry.     Capillary Refill: Capillary refill takes less than 2 seconds.  Neurological:     General: No focal deficit present.     Mental Status: He is alert and oriented to person, place, and time. Mental status is at baseline.     Sensory: Sensory deficit present.     Motor: Weakness present.  Psychiatric:        Mood and Affect: Mood normal.        Behavior: Behavior normal.        Thought Content: Thought content normal.        Judgment: Judgment normal.     EXTREMITIES: Foot exhibits swelling, purple discoloration around the top of the toes, pain upon dorsiflexion of the toes, and swelling extending up the leg towards the knees. SKIN: Toes very cold. Sweating observed on the bottom of both feet, more pronounced on the left foot. Hands sweating.  Results for orders placed or performed in visit on 02/14/23  Urine microalbumin-creatinine with uACR  Result Value Ref Range   Creatinine, Urine 101.6 Not Estab. mg/dL   Microalbumin, Urine 40.9 Not Estab. ug/mL    Microalb/Creat Ratio 13 0 - 29 mg/g creat  Hemoglobin A1c  Result Value Ref Range   Hgb A1c MFr Bld 9.4 (H) 4.8 - 5.6 %   Est. average glucose Bld gHb Est-mCnc 223 mg/dL    Assessment & Plan     Problem List Items Addressed This Visit       Cardiovascular and Mediastinum   Hypertension associated with diabetes (  HCC)   Relevant Medications   metoprolol succinate (TOPROL-XL) 25 MG 24 hr tablet   pioglitazone (ACTOS) 15 MG tablet     Endocrine   Type 2 diabetes mellitus (HCC)   Relevant Medications   pioglitazone (ACTOS) 15 MG tablet   gabapentin (NEURONTIN) 600 MG tablet   Other Relevant Orders   Urine microalbumin-creatinine with uACR (Completed)   Hemoglobin A1c (Completed)     Other   Colon cancer screening   Other Visit Diagnoses     Immunization due    -  Primary   Relevant Orders   Pneumococcal conjugate vaccine 20-valent (Completed)   Flu Vaccine Trivalent High Dose (Fluad) (Completed)   Screen for colon cancer       Relevant Orders   Ambulatory referral to Gastroenterology   Left leg pain       Relevant Orders   US Venous Img Lower Unilateral Left (DVT) (Completed)   Ambulatory referral to Vascular Surgery   Left leg swelling       Relevant Orders   US Venous Img Lower Unilateral Left (DVT) (Completed)   Ambulatory referral to Vascular Surgery   PAD (peripheral artery disease) (HCC)       Relevant Medications   metoprolol succinate (TOPROL-XL) 25 MG 24 hr tablet   Other Relevant Orders   US Venous Img Lower Unilateral Left (DVT) (Completed)   Ambulatory referral to Vascular Surgery     Lower Extremity Pain and Swelling Patient reports recurrent episodes of foot and leg swelling, with associated pain and color changes. On examination, cold and sweaty feet were noted. Suspected venous insufficiency. -Order lower extremity ultrasound to rule out deep vein thrombosis (DVT). -Continue monitoring symptoms and report any worsening.  Diabetes  Mellitus Blood sugars running high (140s). Marcelline Deist not covered by insurance. Discussed alternative medications with pharmacy. -Continue Glipizide. -Add Actos. -Check A1c and urine today.  Neuropathic Pain Patient reports benefit from Gabapentin, especially in colder weather. Reports waking up at night due to pain. -Ensure Gabapentin prescription is refilled.  General Health Maintenance -Eligible for shingles vaccine. Encouraged to get it at the pharmacy. -Check blood pressure medication.    Leilani Merl, FNP, have reviewed all documentation for this visit. The documentation on 02/17/23 for the exam, diagnosis, procedures, and orders are all accurate and complete.  Jacky Kindle, FNP  [facility] [phone] (phone) [phone] (fax)  [facility]

## 2023-02-14 NOTE — Progress Notes (Signed)
 Negative DVT

## 2023-02-15 ENCOUNTER — Ambulatory Visit: Payer: Self-pay | Admitting: *Deleted

## 2023-02-15 ENCOUNTER — Other Ambulatory Visit: Payer: Self-pay

## 2023-02-15 ENCOUNTER — Telehealth: Payer: Self-pay

## 2023-02-15 DIAGNOSIS — Z860100 Personal history of colon polyps, unspecified: Secondary | ICD-10-CM

## 2023-02-15 LAB — HEMOGLOBIN A1C
Est. average glucose Bld gHb Est-mCnc: 223 mg/dL
Hgb A1c MFr Bld: 9.4 % — ABNORMAL HIGH (ref 4.8–5.6)

## 2023-02-15 LAB — MICROALBUMIN / CREATININE URINE RATIO
Creatinine, Urine: 101.6 mg/dL
Microalb/Creat Ratio: 13 mg/g{creat} (ref 0–29)
Microalbumin, Urine: 13.3 ug/mL

## 2023-02-15 MED ORDER — NA SULFATE-K SULFATE-MG SULF 17.5-3.13-1.6 GM/177ML PO SOLN: 1.0000 | Freq: Once | ORAL | 0 refills | Status: AC

## 2023-02-15 NOTE — Telephone Encounter (Signed)
Reason for Disposition  [1] Follow-up call to recent contact AND [2] information only call, no triage required  Answer Assessment - Initial Assessment Questions 1. REASON FOR CALL or QUESTION: "What is your reason for calling today?" or "How can I best help you?" or "What question do you have that I can help answer?"     Carolee Rota, (on DPR) called in and was given his ultrasound result of left lower leg.    Negative for DVT.  Protocols used: Information Only Call - No Triage-A-AH

## 2023-02-15 NOTE — Telephone Encounter (Signed)
  Chief Complaint: Patrick Grant called in and was given the U/S message from Merita Norton, FNP dated 02/14/2023 at 5:06 PM.   Symptoms: He is still having pain in his left side.   He is for a colonoscopy. Frequency: N/A Pertinent Negatives: Patient denies N/A Disposition: [] ED /[] Urgent Care (no appt availability in office) / [] Appointment(In office/virtual)/ []  [place] [facility]/ [] Home Care/ [] Refused Recommended Disposition /[]  Mobile Bus/ [x]  Follow-up with PCP Additional Notes: Message sent to [facility] that she called in for his U/S report.

## 2023-02-15 NOTE — Progress Notes (Signed)
Uncontrolled diabetes and blood pressure; changes made.  Complaints of worsening perfusion to LE; DVT US was negative.  Referral to vascular placed.

## 2023-02-15 NOTE — Telephone Encounter (Signed)
Gastroenterology Pre-Procedure Review  Request Date: 02/27/23 Requesting Physician: Dr. Tobi Bastos  PATIENT REVIEW QUESTIONS: The patient responded to the following health history questions as indicated:    1. Are you having any GI issues? no 2. Do you have a personal history of Polyps? yes (last colonoscopy 02/21/221) 3. Do you have a family history of Colon Cancer or Polyps? no 4. Diabetes Mellitus? yes (has been advised to stop Metformin 2 days prior to colonoscopy stop Actos and Glimeperide 1 day prior) 5. Joint replacements in the past 12 months?no 6. Major health problems in the past 3 months?no 7. Any artificial heart valves, MVP, or defibrillator?no    MEDICATIONS & ALLERGIES:    Patient reports the following regarding taking any anticoagulation/antiplatelet therapy:   Plavix, Coumadin, Eliquis, Xarelto, Lovenox, Pradaxa, Brilinta, or Effient? no Aspirin? no  Patient confirms/reports the following medications:  Current Outpatient Medications  Medication Sig Dispense Refill   amLODipine (NORVASC) 5 MG tablet Take 1 tablet (5 mg total) by mouth daily. Take in addition to Hyzaar 90 tablet 3   gabapentin (NEURONTIN) 600 MG tablet Take 1 tablet (600 mg total) by mouth 3 (three) times daily. 270 tablet 3   glimepiride (AMARYL) 2 MG tablet TAKE 1 TABLET (2 MG TOTAL) BY MOUTH DAILY BEFORE BREAKFAST. 90 tablet 3   losartan-hydrochlorothiazide (HYZAAR) 100-25 MG tablet Take 1 tablet by mouth daily. 90 tablet 3   metFORMIN (GLUCOPHAGE) 1000 MG tablet TAKE 1 TABLET TWICE DAILY WITH MEALS 180 tablet 3   metoprolol succinate (TOPROL-XL) 25 MG 24 hr tablet Take 1 tablet (25 mg total) by mouth daily. 90 tablet 3   pioglitazone (ACTOS) 15 MG tablet Take 1 tablet (15 mg total) by mouth daily. 90 tablet 3   rosuvastatin (CRESTOR) 20 MG tablet Take 1 tablet (20 mg total) by mouth daily. 90 tablet 3   No current facility-administered medications for this visit.    Patient confirms/reports the  following allergies:  Allergies  Allergen Reactions   Almond (Diagnostic) Swelling    throat   Fentanyl Other (See Comments)    Other reaction(s): Jittery   Clove Flavor Other (See Comments)    Loss of vision for 1-2 hrs    No orders of the defined types were placed in this encounter.   AUTHORIZATION INFORMATION Primary Insurance: 1D#: Group #:  Secondary Insurance: 1D#: Group #:  SCHEDULE INFORMATION: Date: 02/27/23 Time: Location: [facility]

## 2023-02-25 ENCOUNTER — Telehealth: Payer: Self-pay

## 2023-02-25 ENCOUNTER — Other Ambulatory Visit: Payer: Self-pay | Admitting: Pharmacist

## 2023-02-25 ENCOUNTER — Telehealth: Payer: Self-pay | Admitting: Pharmacist

## 2023-02-25 NOTE — Progress Notes (Signed)
   02/25/2023  Patient ID: Patrick Grant, male   DOB: 27-Aug-1955, 67 y.o.   MRN: [id number]  Briefly spoke with the patient on the phone today. Reports he has been very sick lately and only seems to be getting worse with time.   Says he will have to call and re-schedule his colonoscopy as he can barely eat, so he does not anticipate being able to do the prep properly. Strongly advised he call them today to notify of cancellation rather than no-showing the appointment. York Spaniel he has the paperwork with their phone number and will try to give them a call to cancel.  As for sickness, has not been checking BP or BG lately due to this. Would prefer to follow-up with PCP on 03/15/23 as scheduled and can complete a follow-up call afterwards for exact home readings if needed. Aware to call office for visit if it becomes severe.    Marlowe Aschoff, PharmD [facility] Phone Number: [phone]

## 2023-02-25 NOTE — Telephone Encounter (Signed)
The patient called and left a voicemail requesting to reschedule his colonoscopy the has a bad cold. I called the patient back to inform him we received his message, and I transfer him back to the scheduler.

## 2023-02-27 ENCOUNTER — Encounter: Payer: Self-pay | Admitting: Certified Registered"

## 2023-02-27 ENCOUNTER — Ambulatory Visit: Admission: RE | Admit: 2023-02-27 | Payer: Medicare HMO | Source: Home / Self Care | Admitting: Gastroenterology

## 2023-02-27 SURGERY — COLONOSCOPY WITH PROPOFOL
Anesthesia: General

## 2023-03-15 ENCOUNTER — Encounter: Payer: Self-pay | Admitting: Family Medicine

## 2023-03-15 ENCOUNTER — Ambulatory Visit: Payer: Medicare HMO | Admitting: Family Medicine

## 2023-03-15 VITALS — BP 116/86 | HR 103 | Ht 73.0 in | Wt 209.0 lb

## 2023-03-15 DIAGNOSIS — F488 Other specified nonpsychotic mental disorders: Secondary | ICD-10-CM | POA: Insufficient documentation

## 2023-03-15 DIAGNOSIS — E1140 Type 2 diabetes mellitus with diabetic neuropathy, unspecified: Secondary | ICD-10-CM | POA: Diagnosis not present

## 2023-03-15 DIAGNOSIS — E1159 Type 2 diabetes mellitus with other circulatory complications: Secondary | ICD-10-CM | POA: Diagnosis not present

## 2023-03-15 DIAGNOSIS — I152 Hypertension secondary to endocrine disorders: Secondary | ICD-10-CM | POA: Diagnosis not present

## 2023-03-15 NOTE — Assessment & Plan Note (Signed)
Chronic, uncontrolled with chronic neuropathic pain Continue to recommend balanced, lower carb meals. Smaller meal size, adding snacks. Choosing water as drink of choice and increasing purposeful exercise. Referral to dietary to assist

## 2023-03-15 NOTE — Progress Notes (Signed)
Established patient visit   Patient: Patrick Grant   DOB: [birth year]   67 y.o. Male  MRN: [id number] Visit Date: 03/15/2023  Today's healthcare provider: Jacky Kindle, FNP  Introduced to nurse practitioner role and practice setting.  All questions answered.  Discussed provider/patient relationship and expectations.  Chief Complaint  Patient presents with   Follow-up    HTN   Subjective    HPI HPI     Follow-up    Additional comments: HTN      Last edited by Shelly Bombard, CMA on 03/15/2023 10:02 AM.     Hypertension, follow-up  BP Readings from Last 3 Encounters:  03/15/23 116/86  02/14/23 (!) 150/87  12/28/22 (!) 150/92   Wt Readings from Last 3 Encounters:  03/15/23 209 lb (94.8 kg)  02/14/23 205 lb (93 kg)  12/28/22 209 lb 4.8 oz (94.9 kg)     He was last seen for hypertension 1 months ago.  BP at that visit was 150/87 . Management since that visit includes addition of Toprol XL 25.  He reports excellent compliance with treatment. He is not having side effects.  He is following a Regular diet. He is not exercising. He does not smoke.  Use of agents associated with hypertension: none.   Outside blood pressures are not being checked. Symptoms: No chest pain No chest pressure  No palpitations No syncope  No dyspnea No orthopnea  No paroxysmal nocturnal dyspnea Yes lower extremity edema   Pertinent labs Lab Results  Component Value Date   CHOL 167 11/16/2022   HDL 42 11/16/2022   LDLCALC 101 (H) 11/16/2022   TRIG 137 11/16/2022   CHOLHDL 4.0 11/16/2022   Lab Results  Component Value Date   NA 137 11/16/2022   K 4.4 11/16/2022   CREATININE 0.99 11/16/2022   EGFR 85 11/16/2022   GLUCOSE 141 (H) 11/16/2022   TSH 1.020 11/16/2022     The 10-year ASCVD risk score (Arnett DK, et al., 2019) is: 22.3%  ---------------------------------------------------------------------------------------------------  Medications: Outpatient  Medications Prior to Visit  Medication Sig   amLODipine (NORVASC) 5 MG tablet Take 1 tablet (5 mg total) by mouth daily. Take in addition to Hyzaar   gabapentin (NEURONTIN) 600 MG tablet Take 1 tablet (600 mg total) by mouth 3 (three) times daily.   glimepiride (AMARYL) 2 MG tablet TAKE 1 TABLET (2 MG TOTAL) BY MOUTH DAILY BEFORE BREAKFAST.   losartan-hydrochlorothiazide (HYZAAR) 100-25 MG tablet Take 1 tablet by mouth daily.   metFORMIN (GLUCOPHAGE) 1000 MG tablet TAKE 1 TABLET TWICE DAILY WITH MEALS   metoprolol succinate (TOPROL-XL) 25 MG 24 hr tablet Take 1 tablet (25 mg total) by mouth daily.   pioglitazone (ACTOS) 15 MG tablet Take 1 tablet (15 mg total) by mouth daily.   rosuvastatin (CRESTOR) 20 MG tablet Take 1 tablet (20 mg total) by mouth daily.   No facility-administered medications prior to visit.   Last CBC Lab Results  Component Value Date   WBC 10.1 11/16/2022   HGB 15.5 11/16/2022   HCT 46.2 11/16/2022   MCV 91 11/16/2022   MCH 30.5 11/16/2022   RDW 12.5 11/16/2022   PLT 374 11/16/2022   Last metabolic panel Lab Results  Component Value Date   GLUCOSE 141 (H) 11/16/2022   NA 137 11/16/2022   K 4.4 11/16/2022   CL 101 11/16/2022   CO2 22 11/16/2022   BUN 11 11/16/2022   CREATININE 0.99 11/16/2022  EGFR 85 11/16/2022   CALCIUM 9.9 11/16/2022   PROT 6.9 11/16/2022   ALBUMIN 4.6 11/16/2022   LABGLOB 2.3 11/16/2022   AGRATIO 2.0 01/15/2022   BILITOT 0.4 11/16/2022   ALKPHOS 79 11/16/2022   AST 14 11/16/2022   ALT 15 11/16/2022   ANIONGAP 10 04/29/2016   Last lipids Lab Results  Component Value Date   CHOL 167 11/16/2022   HDL 42 11/16/2022   LDLCALC 101 (H) 11/16/2022   TRIG 137 11/16/2022   CHOLHDL 4.0 11/16/2022   Last hemoglobin A1c Lab Results  Component Value Date   HGBA1C 9.4 (H) 02/14/2023   Last thyroid functions Lab Results  Component Value Date   TSH 1.020 11/16/2022     Objective    BP 116/86 (BP Location: Right Arm, Patient  Position: Sitting, Cuff Size: Normal)   Pulse (!) 103   Ht 6\' 1"  (1.854 m)   Wt 209 lb (94.8 kg)   SpO2 99%   BMI 27.57 kg/m   BP Readings from Last 3 Encounters:  03/15/23 116/86  02/14/23 (!) 150/87  12/28/22 (!) 150/92   Wt Readings from Last 3 Encounters:  03/15/23 209 lb (94.8 kg)  02/14/23 205 lb (93 kg)  12/28/22 209 lb 4.8 oz (94.9 kg)   SpO2 Readings from Last 3 Encounters:  03/15/23 99%  02/14/23 99%  12/28/22 99%   Physical Exam Vitals and nursing note reviewed.  Constitutional:      Appearance: Normal appearance. He is overweight.  HENT:     Head: Normocephalic and atraumatic.  Cardiovascular:     Rate and Rhythm: Normal rate and regular rhythm.     Pulses: Normal pulses.     Heart sounds: Normal heart sounds.  Pulmonary:     Effort: Pulmonary effort is normal.     Breath sounds: Normal breath sounds.  Musculoskeletal:        General: Normal range of motion.     Cervical back: Normal range of motion.  Skin:    General: Skin is warm and dry.     Capillary Refill: Capillary refill takes less than 2 seconds.  Neurological:     Mental Status: He is alert and oriented to person, place, and time. Mental status is at baseline.     Motor: Weakness present.     Gait: Gait abnormal.     Comments: Chronic use of chronics given chronic neuropathic pain   Psychiatric:        Mood and Affect: Mood normal.        Behavior: Behavior normal.        Thought Content: Thought content normal.        Judgment: Judgment normal.     No results found for any visits on 03/15/23.  Assessment & Plan     Problem List Items Addressed This Visit       Cardiovascular and Mediastinum   Hypertension associated with diabetes (HCC) - Primary   Chronic, improved Goal remains 119/79; however, given chronic neuropathy- happy with current readings Plan to continue toprol 25 mg daily       Relevant Orders   Amb ref to Medical Nutrition Therapy-MNT     Endocrine   Type 2  diabetes mellitus (HCC)   Chronic, uncontrolled with chronic neuropathic pain Continue to recommend balanced, lower carb meals. Smaller meal size, adding snacks. Choosing water as drink of choice and increasing purposeful exercise. Referral to dietary to assist      Relevant Orders  Amb ref to Medical Nutrition Therapy-MNT     Other   Nervous debility   Chronic, given use of crutches from chronic neuropathy iso uncontrolled DM High risk for falls Continue to monitor       Relevant Orders   Amb ref to Medical Nutrition Therapy-MNT   Return in about 2 months (around 05/16/2023) for T2DM management.     Leilani Merl, FNP, have reviewed all documentation for this visit. The documentation on 03/15/23 for the exam, diagnosis, procedures, and orders are all accurate and complete.  Jacky Kindle, FNP  [facility] [phone] (phone) [phone] (fax)  [facility]

## 2023-03-15 NOTE — Assessment & Plan Note (Signed)
Chronic, improved Goal remains 119/79; however, given chronic neuropathy- happy with current readings Plan to continue toprol 25 mg daily

## 2023-03-15 NOTE — Assessment & Plan Note (Signed)
Chronic, given use of crutches from chronic neuropathy iso uncontrolled DM High risk for falls Continue to monitor

## 2023-03-19 ENCOUNTER — Other Ambulatory Visit (INDEPENDENT_AMBULATORY_CARE_PROVIDER_SITE_OTHER): Payer: Self-pay | Admitting: Nurse Practitioner

## 2023-03-19 DIAGNOSIS — M79605 Pain in left leg: Secondary | ICD-10-CM

## 2023-03-19 DIAGNOSIS — I739 Peripheral vascular disease, unspecified: Secondary | ICD-10-CM

## 2023-03-25 ENCOUNTER — Encounter (INDEPENDENT_AMBULATORY_CARE_PROVIDER_SITE_OTHER): Payer: Self-pay | Admitting: Vascular Surgery

## 2023-03-25 ENCOUNTER — Ambulatory Visit (INDEPENDENT_AMBULATORY_CARE_PROVIDER_SITE_OTHER): Payer: Medicare HMO | Admitting: Vascular Surgery

## 2023-03-25 ENCOUNTER — Ambulatory Visit (INDEPENDENT_AMBULATORY_CARE_PROVIDER_SITE_OTHER): Payer: Medicare HMO

## 2023-03-25 VITALS — BP 121/83 | HR 94 | Resp 18 | Ht 73.0 in | Wt 210.8 lb

## 2023-03-25 DIAGNOSIS — E785 Hyperlipidemia, unspecified: Secondary | ICD-10-CM | POA: Diagnosis not present

## 2023-03-25 DIAGNOSIS — E1100 Type 2 diabetes mellitus with hyperosmolarity without nonketotic hyperglycemic-hyperosmolar coma (NKHHC): Secondary | ICD-10-CM

## 2023-03-25 DIAGNOSIS — G90522 Complex regional pain syndrome I of left lower limb: Secondary | ICD-10-CM

## 2023-03-25 DIAGNOSIS — J439 Emphysema, unspecified: Secondary | ICD-10-CM | POA: Diagnosis not present

## 2023-03-25 DIAGNOSIS — I739 Peripheral vascular disease, unspecified: Secondary | ICD-10-CM | POA: Diagnosis not present

## 2023-03-25 DIAGNOSIS — E1169 Type 2 diabetes mellitus with other specified complication: Secondary | ICD-10-CM | POA: Diagnosis not present

## 2023-03-25 DIAGNOSIS — M79605 Pain in left leg: Secondary | ICD-10-CM

## 2023-03-26 LAB — VAS US ABI WITH/WO TBI
Left ABI: 0.99
Right ABI: 0.95

## 2023-03-29 ENCOUNTER — Encounter (INDEPENDENT_AMBULATORY_CARE_PROVIDER_SITE_OTHER): Payer: Self-pay | Admitting: Vascular Surgery

## 2023-03-29 DIAGNOSIS — G90529 Complex regional pain syndrome I of unspecified lower limb: Secondary | ICD-10-CM | POA: Insufficient documentation

## 2023-03-29 DIAGNOSIS — M79606 Pain in leg, unspecified: Secondary | ICD-10-CM | POA: Insufficient documentation

## 2023-05-17 ENCOUNTER — Encounter: Payer: Self-pay | Admitting: Family Medicine

## 2023-05-17 ENCOUNTER — Ambulatory Visit: Payer: Medicare HMO | Admitting: Family Medicine

## 2023-05-17 VITALS — BP 135/80 | HR 94 | Temp 97.5°F | Resp 16 | Ht 73.0 in | Wt 210.0 lb

## 2023-05-17 DIAGNOSIS — E785 Hyperlipidemia, unspecified: Secondary | ICD-10-CM

## 2023-05-17 DIAGNOSIS — J439 Emphysema, unspecified: Secondary | ICD-10-CM | POA: Diagnosis not present

## 2023-05-17 DIAGNOSIS — E1169 Type 2 diabetes mellitus with other specified complication: Secondary | ICD-10-CM

## 2023-05-17 DIAGNOSIS — M79605 Pain in left leg: Secondary | ICD-10-CM | POA: Diagnosis not present

## 2023-05-17 DIAGNOSIS — J189 Pneumonia, unspecified organism: Secondary | ICD-10-CM

## 2023-05-17 DIAGNOSIS — I152 Hypertension secondary to endocrine disorders: Secondary | ICD-10-CM

## 2023-05-17 DIAGNOSIS — J441 Chronic obstructive pulmonary disease with (acute) exacerbation: Secondary | ICD-10-CM | POA: Diagnosis not present

## 2023-05-17 DIAGNOSIS — E1159 Type 2 diabetes mellitus with other circulatory complications: Secondary | ICD-10-CM | POA: Diagnosis not present

## 2023-05-17 DIAGNOSIS — Z1211 Encounter for screening for malignant neoplasm of colon: Secondary | ICD-10-CM

## 2023-05-17 DIAGNOSIS — E1140 Type 2 diabetes mellitus with diabetic neuropathy, unspecified: Secondary | ICD-10-CM

## 2023-05-17 DIAGNOSIS — Z09 Encounter for follow-up examination after completed treatment for conditions other than malignant neoplasm: Secondary | ICD-10-CM | POA: Insufficient documentation

## 2023-05-17 DIAGNOSIS — Z7984 Long term (current) use of oral hypoglycemic drugs: Secondary | ICD-10-CM

## 2023-05-17 LAB — POCT GLYCOSYLATED HEMOGLOBIN (HGB A1C)
Est. average glucose Bld gHb Est-mCnc: 243
Hemoglobin A1C: 10.1 % — AB (ref 4.0–5.6)

## 2023-05-17 MED ORDER — AMOXICILLIN-POT CLAVULANATE 875-125 MG PO TABS: 1.0000 | ORAL_TABLET | Freq: Two times a day (BID) | ORAL | 0 refills | Status: DC

## 2023-05-17 MED ORDER — AZITHROMYCIN 250 MG PO TABS: ORAL_TABLET | ORAL | 0 refills | Status: AC

## 2023-05-17 MED ORDER — METHYLPREDNISOLONE 4 MG PO TBPK: ORAL_TABLET | ORAL | 0 refills | Status: DC

## 2023-05-17 MED ORDER — ALBUTEROL SULFATE HFA 108 (90 BASE) MCG/ACT IN AERS: 2.0000 | INHALATION_SPRAY | Freq: Four times a day (QID) | RESPIRATORY_TRACT | 2 refills | Status: AC | PRN

## 2023-05-17 NOTE — Assessment & Plan Note (Signed)
Non-compliance with diabetes medications for the past 7 days, likely contributing to malaise and potential hyperglycemia. Discussed importance of resuming medications to manage blood glucose levels and prevent complications. Advised to reduce carbohydrate intake and resume a balanced diet. - Encourage resumption of diabetes medications - Schedule follow-up in 4-6 weeks to review blood glucose levels  - Advise to bring blood glucose log to next appointment - Offered referral back to nutrition/dietetics for further counseling, which patient declined for now

## 2023-05-17 NOTE — Assessment & Plan Note (Signed)
Counseled patient to call and reschedule his colonoscopy.

## 2023-05-17 NOTE — Assessment & Plan Note (Signed)
Reports severe leg pain radiating to the hip, causing frequent falls and requiring crutches. Pain exacerbated by recent illness and non-compliance with pain medications. Discussed importance of resuming pain medications to manage symptoms and prevent further deterioration. - Encourage resumption of pain medications

## 2023-05-17 NOTE — Assessment & Plan Note (Deleted)
 Chronic, given use of crutches from chronic neuropathy iso uncontrolled DM High risk for falls Continue to monitor

## 2023-05-17 NOTE — Assessment & Plan Note (Addendum)
Mildly elevated today; however, patient has not been taking his medications during his illness. Patient has resumed; counseled on adherence. Recheck at follow up. Continue losartan-hydrochlorothiazide 100-25 mg daily Continue metoprolol XL 25 mg daily

## 2023-05-17 NOTE — Assessment & Plan Note (Signed)
Reports increased shortness of breath and wheezing, especially with physical activity, exacerbated by recent illness. Discussed albuterol inhaler for acute symptoms and potential need for baseline inhalers if symptoms persist. - Prescribe albuterol inhaler - Monitor for need of baseline inhalers

## 2023-05-17 NOTE — Assessment & Plan Note (Addendum)
Patient has not been taking his medications during his illness. Patient has resumed; counseled on adherence. Recheck at follow up.  Continue rosuvastatin 20 mg daily

## 2023-05-17 NOTE — Assessment & Plan Note (Signed)
Presents with persistent cough, shortness of breath, wheezing, and generalized body pain for 8-9 days, worsening to significant fatigue and inability to perform daily activities. Physical examination reveals wheezing and decreased breath sounds. Discussed risks of untreated pneumonia, including potential hospitalization and respiratory failure. Explained benefits of antibiotics and expected symptom improvement within a few days. Discussed Medrol pack for inflammation with lower hyperglycemia risk compared to prednisone. Informed about albuterol inhaler for acute symptoms. - Prescribe antibiotics - Prescribe Medrol pack - Prescribe albuterol inhaler

## 2023-05-17 NOTE — Patient Instructions (Addendum)
Reschedule your colonoscopy. Recommend: shingrix vaccine for shingles.

## 2023-05-17 NOTE — Progress Notes (Signed)
Established patient visit   Patient: Patrick Grant   DOB: 1955/06/20   68 y.o. Male  MRN: [id number] Visit Date: 05/17/2023  Today's healthcare provider: Sherlyn Hay, DO   Chief Complaint  Patient presents with   Medical Management of Chronic Issues    T2DM   Subjective    HPI Patrick Grant "Patrick Grant" is a 68 year old male with chronic COPD and asthma who presents with worsening respiratory symptoms and medication non-compliance.  He has experienced a significant exacerbation of respiratory symptoms over the past eight to nine days, including a persistent cough, shortness of breath, and wheezing, particularly worsened by physical activity and coughing. The cough is severe enough to cause dizziness and a sensation of near syncope. He has a history of chronic COPD and asthma and previously used inhalers such as Dulera and Symbicort, but has not used them recently. He also has a history of smoking cessation 7-8 years ago, which had previously improved his breathing.  He has been non-compliant with his medications for the past six to seven days, attributing this to feeling unwell and bedridden for four days. During this time, he only got up to care for his animals and has been eating minimally, primarily consuming oranges due to a lack of energy to cook. He has been taking gabapentin for pain but has not taken his other medications, which he realized when he checked his pill organizer and found it full.  He experiences significant leg pain, describing it as a 'lightning strike' sensation that travels up his leg to his hip, causing his leg to give out unexpectedly. This has led to multiple falls over the past year, and he uses crutches for mobility. The pain has worsened recently, possibly due to not taking his prescribed pain medication.  He reports generalized body soreness, joint pain, and a 'froggy' voice, which his sister commented on. No nausea, vomiting, diarrhea, or  constipation. He has experienced dizziness, especially when standing up after bending over, which he associates with his current illness. He recalls similar dizziness when he was on Jardiance, which resolved after discontinuation.  His sister, Patrick Grant, was recently hospitalized for lung cancer, which has added stress to his situation.  He has not had a repeat colonoscopy, which was due last year, due to previous health issues including shingles.     Medications: Outpatient Medications Prior to Visit  Medication Sig   amLODipine (NORVASC) 5 MG tablet Take 1 tablet (5 mg total) by mouth daily. Take in addition to Hyzaar   gabapentin (NEURONTIN) 600 MG tablet Take 1 tablet (600 mg total) by mouth 3 (three) times daily.   glimepiride (AMARYL) 2 MG tablet TAKE 1 TABLET (2 MG TOTAL) BY MOUTH DAILY BEFORE BREAKFAST.   losartan-hydrochlorothiazide (HYZAAR) 100-25 MG tablet Take 1 tablet by mouth daily.   metFORMIN (GLUCOPHAGE) 1000 MG tablet TAKE 1 TABLET TWICE DAILY WITH MEALS   metoprolol succinate (TOPROL-XL) 25 MG 24 hr tablet Take 1 tablet (25 mg total) by mouth daily.   pioglitazone (ACTOS) 15 MG tablet Take 1 tablet (15 mg total) by mouth daily.   rosuvastatin (CRESTOR) 20 MG tablet Take 1 tablet (20 mg total) by mouth daily.   No facility-administered medications prior to visit.    Review of Systems  Respiratory:  Positive for cough, shortness of breath and wheezing.   Cardiovascular:  Negative for chest pain and palpitations.  Neurological:  Negative for weakness and headaches.  Objective    BP 135/80 (BP Location: Right Arm, Patient Position: Sitting, Cuff Size: Large)   Pulse 94   Temp (!) 97.5 F (36.4 C) (Oral)   Resp 16   Ht 6\' 1"  (1.854 m)   Wt 210 lb (95.3 kg)   SpO2 97%   BMI 27.71 kg/m     Physical Exam Vitals reviewed.  Constitutional:      General: He is not in acute distress.    Appearance: Normal appearance. He is not diaphoretic.  HENT:      Head: Normocephalic and atraumatic.  Eyes:     General: No scleral icterus.    Conjunctiva/sclera: Conjunctivae normal.  Cardiovascular:     Rate and Rhythm: Normal rate and regular rhythm.     Pulses: Normal pulses.     Heart sounds: Normal heart sounds. No murmur heard. Pulmonary:     Effort: Pulmonary effort is normal. No respiratory distress.     Breath sounds: Decreased air movement present. Wheezing present. No rhonchi.  Musculoskeletal:     Cervical back: Neck supple.     Right lower leg: No edema.     Left lower leg: No edema.  Lymphadenopathy:     Cervical: No cervical adenopathy.  Skin:    General: Skin is warm and dry.     Findings: No rash.  Neurological:     Mental Status: He is alert and oriented to person, place, and time. Mental status is at baseline.  Psychiatric:        Mood and Affect: Mood normal.        Behavior: Behavior normal.      Results for orders placed or performed in visit on 05/17/23  POCT glycosylated hemoglobin (Hb A1C)  Result Value Ref Range   Hemoglobin A1C 10.1 (A) 4.0 - 5.6 %   Est. average glucose Bld gHb Est-mCnc 243     Assessment & Plan    Community acquired pneumonia, unspecified laterality Assessment & Plan: Presents with persistent cough, shortness of breath, wheezing, and generalized body pain for 8-9 days, worsening to significant fatigue and inability to perform daily activities. Physical examination reveals wheezing and decreased breath sounds. Discussed risks of untreated pneumonia, including potential hospitalization and respiratory failure. Explained benefits of antibiotics and expected symptom improvement within a few days. Discussed Medrol pack for inflammation with lower hyperglycemia risk compared to prednisone. Informed about albuterol inhaler for acute symptoms. - Prescribe antibiotics - Prescribe Medrol pack - Prescribe albuterol inhaler  Orders: -     Amoxicillin-Pot Clavulanate; Take 1 tablet by mouth 2 (two)  times daily.  Dispense: 20 tablet; Refill: 0 -     Azithromycin; Take 2 tablets on day 1, then 1 tablet daily on days 2 through 5  Dispense: 6 tablet; Refill: 0  Follow-up exam, 3-6 months since previous exam Assessment & Plan: Overdue for colonoscopy and shingles vaccine. Previous colonoscopy delayed due to shingles. Discussed importance of routine screenings and vaccinations to prevent complications. - Recommend scheduling colonoscopy - Recommend Shingrix vaccine (two-step series)   COPD with acute exacerbation [facility]) Assessment & Plan: Addressed as noted above.  Orders: -     methylPREDNISolone; Take 6 pills on day 1, 5 pills on day 2, 4 pills on day 3, 3 pills on day 4, 2 pills on day 5, 1 pill on day 6  Dispense: 1 each; Refill: 0 -     Albuterol Sulfate HFA; Inhale 2 puffs into the lungs every 6 (six) hours as  needed for wheezing or shortness of breath.  Dispense: 8 g; Refill: 2  Type 2 diabetes mellitus with diabetic neuropathy, without long-term current use of insulin (HCC) Assessment & Plan: Non-compliance with diabetes medications for the past 7 days, likely contributing to malaise and potential hyperglycemia. Discussed importance of resuming medications to manage blood glucose levels and prevent complications. Advised to reduce carbohydrate intake and resume a balanced diet. - Encourage resumption of diabetes medications - Schedule follow-up in 4-6 weeks to review blood glucose levels  - Advise to bring blood glucose log to next appointment - Offered referral back to nutrition/dietetics for further counseling, which patient declined for now  Orders: -     POCT glycosylated hemoglobin (Hb A1C)  Hypertension associated with diabetes (HCC) Assessment & Plan: Mildly elevated today; however, patient has not been taking his medications during his illness. Patient has resumed; counseled on adherence. Recheck at follow up. Continue losartan-hydrochlorothiazide 100-25 mg  daily Continue metoprolol XL 25 mg daily   Hyperlipidemia associated with type 2 diabetes mellitus (HCC) Assessment & Plan: Patient has not been taking his medications during his illness. Patient has resumed; counseled on adherence. Recheck at follow up.  Continue rosuvastatin 20 mg daily   Pain of left lower extremity Assessment & Plan: Reports severe leg pain radiating to the hip, causing frequent falls and requiring crutches. Pain exacerbated by recent illness and non-compliance with pain medications. Discussed importance of resuming pain medications to manage symptoms and prevent further deterioration. - Encourage resumption of pain medications   Pulmonary emphysema, unspecified emphysema type (HCC) Assessment & Plan: Reports increased shortness of breath and wheezing, especially with physical activity, exacerbated by recent illness. Discussed albuterol inhaler for acute symptoms and potential need for baseline inhalers if symptoms persist. - Prescribe albuterol inhaler - Monitor for need of baseline inhalers    Colon cancer screening Assessment & Plan: Counseled patient to call and reschedule his colonoscopy.    Follow-up - Follow-up in 4-6 weeks to assess response to treatment and review blood glucose levels - Call and reschedule colonoscopy - Receive Shingrix vaccine.   Return in about 4 weeks (around 06/14/2023) for recheck.      I discussed the assessment and treatment plan with the patient  The patient was provided an opportunity to ask questions and all were answered. The patient agreed with the plan and demonstrated an understanding of the instructions.   The patient was advised to call back or seek an in-person evaluation if the symptoms worsen or if the condition fails to improve as anticipated.    Sherlyn Hay, DO  [facility] [phone] (phone) [phone] (fax)  [facility]

## 2023-05-17 NOTE — Assessment & Plan Note (Signed)
 Addressed as noted above.

## 2023-05-17 NOTE — Assessment & Plan Note (Signed)
Overdue for colonoscopy and shingles vaccine. Previous colonoscopy delayed due to shingles. Discussed importance of routine screenings and vaccinations to prevent complications. - Recommend scheduling colonoscopy - Recommend Shingrix vaccine (two-step series)

## 2023-06-14 ENCOUNTER — Encounter: Payer: Self-pay | Admitting: Family Medicine

## 2023-06-14 ENCOUNTER — Ambulatory Visit (INDEPENDENT_AMBULATORY_CARE_PROVIDER_SITE_OTHER): Payer: Medicare HMO | Admitting: Family Medicine

## 2023-06-14 VITALS — BP 139/81 | HR 102 | Ht 73.0 in | Wt 209.5 lb

## 2023-06-14 DIAGNOSIS — L74512 Primary focal hyperhidrosis, palms: Secondary | ICD-10-CM | POA: Diagnosis not present

## 2023-06-14 DIAGNOSIS — J439 Emphysema, unspecified: Secondary | ICD-10-CM | POA: Diagnosis not present

## 2023-06-14 DIAGNOSIS — E785 Hyperlipidemia, unspecified: Secondary | ICD-10-CM

## 2023-06-14 DIAGNOSIS — N182 Chronic kidney disease, stage 2 (mild): Secondary | ICD-10-CM

## 2023-06-14 DIAGNOSIS — R052 Subacute cough: Secondary | ICD-10-CM

## 2023-06-14 DIAGNOSIS — G90522 Complex regional pain syndrome I of left lower limb: Secondary | ICD-10-CM | POA: Diagnosis not present

## 2023-06-14 DIAGNOSIS — Z09 Encounter for follow-up examination after completed treatment for conditions other than malignant neoplasm: Secondary | ICD-10-CM | POA: Diagnosis not present

## 2023-06-14 DIAGNOSIS — Z1321 Encounter for screening for nutritional disorder: Secondary | ICD-10-CM

## 2023-06-14 DIAGNOSIS — I152 Hypertension secondary to endocrine disorders: Secondary | ICD-10-CM

## 2023-06-14 DIAGNOSIS — E1122 Type 2 diabetes mellitus with diabetic chronic kidney disease: Secondary | ICD-10-CM

## 2023-06-14 DIAGNOSIS — E1159 Type 2 diabetes mellitus with other circulatory complications: Secondary | ICD-10-CM

## 2023-06-14 DIAGNOSIS — L74513 Primary focal hyperhidrosis, soles: Secondary | ICD-10-CM

## 2023-06-14 DIAGNOSIS — E1169 Type 2 diabetes mellitus with other specified complication: Secondary | ICD-10-CM

## 2023-06-14 MED ORDER — WIXELA INHUB 100-50 MCG/ACT IN AEPB: 1.0000 | INHALATION_SPRAY | Freq: Two times a day (BID) | RESPIRATORY_TRACT | 11 refills | Status: AC

## 2023-06-14 MED ORDER — GABAPENTIN 800 MG PO TABS: 800.0000 mg | ORAL_TABLET | Freq: Three times a day (TID) | ORAL | 0 refills | Status: DC

## 2023-06-14 MED ORDER — ALUMINUM CHLORIDE HEXAHYDRATE CRYS: CRYSTALS | 0 refills | Status: AC

## 2023-06-14 NOTE — Progress Notes (Signed)
 Established patient visit   Patient: Patrick Grant   DOB: 1955-09-14   68 y.o. Male  MRN: [id number] Visit Date: 06/14/2023  Today's healthcare provider: Sherlyn Hay, DO   Chief Complaint  Patient presents with   Follow-up    4 wk follow up    Subjective    HPI Jobany Montellano Riche "[name]" is a 67 year old male with chronic leg pain and emphysema who presents with worsening leg pain and respiratory symptoms.  He has been experiencing significant leg pain for the past three weeks, severely impacting his sleep, allowing only about three and a half hours of rest per night. The pain is described as a constant, progressively worsening sensation that feels like 'electricity runs up my leg,' accompanied by tingling and burning. It is primarily in his foot and radiates up to his thigh, sometimes causing a warm sensation. This pain has been ongoing for nearly ten years, initially diagnosed as reflex sympathetic dystrophy (RSD). Previous treatments included oxycodone, fentanyl patches, morphine, gabapentin, and nerve blocks, but these led to severe side effects, including lethargy and significant weight gain. Physical therapy and nerve blocks provided temporary relief, but the pain returned intensely. He is currently taking gabapentin three times a day, which offers minimal relief.  He has been experiencing respiratory symptoms, including a persistent cough and congestion, which began after a previous illness. He completed courses of Augmentin and azithromycin, which initially improved his symptoms, but they returned after the medication was finished. He uses an albuterol inhaler frequently and reports feeling hoarse and experiencing shortness of breath. No night sweats or significant phlegm production, though he occasionally coughs up phlegm. He has a history of emphysema and COPD, diagnosed after quitting smoking over ten years ago. He previously used inhalers like Spiriva and Dulera but has  not used them recently. His breathing has worsened since his last visit, and he has not had a pulmonary function test in several years.  He reports excessive sweating of his hands and feet, which began three weeks ago. This sweating is severe enough to leave footprints and requires him to frequently wipe his hands. It does not affect his underarms. No fever or chills, but he reports his wife has noted a slightly elevated temperature.  He mentions a history of heart issues, including a previous elevated heart rate and being prescribed nitroglycerin. He has not seen a cardiologist in over a year. He is currently on metoprolol.      Medications: Outpatient Medications Prior to Visit  Medication Sig   albuterol (VENTOLIN HFA) 108 (90 Base) MCG/ACT inhaler Inhale 2 puffs into the lungs every 6 (six) hours as needed for wheezing or shortness of breath.   amLODipine (NORVASC) 5 MG tablet Take 1 tablet (5 mg total) by mouth daily. Take in addition to Hyzaar   glimepiride (AMARYL) 2 MG tablet TAKE 1 TABLET (2 MG TOTAL) BY MOUTH DAILY BEFORE BREAKFAST.   losartan-hydrochlorothiazide (HYZAAR) 100-25 MG tablet Take 1 tablet by mouth daily.   metFORMIN (GLUCOPHAGE) 1000 MG tablet TAKE 1 TABLET TWICE DAILY WITH MEALS   metoprolol succinate (TOPROL-XL) 25 MG 24 hr tablet Take 1 tablet (25 mg total) by mouth daily.   pioglitazone (ACTOS) 15 MG tablet Take 1 tablet (15 mg total) by mouth daily.   rosuvastatin (CRESTOR) 20 MG tablet Take 1 tablet (20 mg total) by mouth daily.   [DISCONTINUED] amoxicillin-clavulanate (AUGMENTIN) 875-125 MG tablet Take 1 tablet by mouth 2 (two) times  daily.   [DISCONTINUED] gabapentin (NEURONTIN) 600 MG tablet Take 1 tablet (600 mg total) by mouth 3 (three) times daily.   [DISCONTINUED] methylPREDNISolone (MEDROL DOSEPAK) 4 MG TBPK tablet Take 6 pills on day 1, 5 pills on day 2, 4 pills on day 3, 3 pills on day 4, 2 pills on day 5, 1 pill on day 6   No facility-administered  medications prior to visit.    Review of Systems  Constitutional:  Positive for diaphoresis (palms and soles). Negative for chills, fatigue and fever (feels hot but temp never actually high).  Eyes:  Positive for discharge (watery). Negative for itching.  Respiratory: Negative.  Negative for cough, shortness of breath and wheezing.   Cardiovascular:  Negative for chest pain, palpitations and leg swelling.  Gastrointestinal:  Negative for abdominal pain, constipation, diarrhea, nausea and vomiting.  Musculoskeletal:  Positive for arthralgias (pain in left foot, extending up to thigh).  Skin:  Positive for color change (flushing in palms).  Neurological:  Positive for numbness (numb/tingling/electrical shock pain). Negative for weakness and headaches.        Objective    BP 139/81   Pulse (!) 102   Ht 6\' 1"  (1.854 m)   Wt 209 lb 8 oz (95 kg)   SpO2 98%   BMI 27.64 kg/m     Physical Exam Vitals and nursing note reviewed.  Constitutional:      General: He is not in acute distress.    Appearance: Normal appearance.  HENT:     Head: Normocephalic and atraumatic.  Eyes:     General: No scleral icterus.    Conjunctiva/sclera: Conjunctivae normal.  Cardiovascular:     Rate and Rhythm: Normal rate.  Pulmonary:     Effort: Pulmonary effort is normal.  Skin:    Comments: Sweating and flushing of palms bilaterally, while seated during exam  Neurological:     Mental Status: He is alert and oriented to person, place, and time. Mental status is at baseline.  Psychiatric:        Mood and Affect: Mood normal.        Behavior: Behavior normal.      No results found for any visits on 06/14/23.  Assessment & Plan    Hyperhidrosis of palms and soles -     TSH Rfx on Abnormal to Free T4 -     Aluminum Chloride Hexahydrate; Topical: Apply once daily at bedtime; once excessive sweating has stopped, may decrease to once or twice weekly, or as needed. Wash treated area in the morning.   Dispense: 100 g; Refill: 0  Follow-up exam, less than 3 months since previous exam  Reflex sympathetic dystrophy of left lower extremity -     Gabapentin; Take 1 tablet (800 mg total) by mouth 3 (three) times daily.  Dispense: 270 tablet; Refill: 0  Pulmonary emphysema, unspecified emphysema type (HCC) -     Wixela Inhub; Inhale 1 puff into the lungs 2 (two) times daily.  Dispense: 60 each; Refill: 11  Subacute cough -     DG Chest 2 View; Future  Hypertension associated with diabetes (HCC) -     Comprehensive metabolic panel with GFR  Hyperlipidemia associated with type 2 diabetes mellitus (HCC) -     Lipid panel  Type 2 diabetes mellitus with stage 2 chronic kidney disease, without long-term current use of insulin (HCC) -     Microalbumin / creatinine urine ratio -     Hemoglobin A1c -  VITAMIN D 25 Hydroxy (Vit-D Deficiency, Fractures)  Encounter for vitamin deficiency screening -     Vitamin B12   Hyperhidrosis Excessive sweating of the hands and feet for the past three weeks. The sweating is not generalized and does not affect the underarms. Ruled out metoprolol as a cause of the sweating and considered it unrelated to current medications. Discussed the use of an antiperspirant to manage symptoms. - Prescribe antiperspirant for palms and feet  Reflex Sympathetic Dystrophy Chronic pain in the left leg, progressively worsening over the last ten years, described as a burning sensation with tingling and swelling, exacerbated by physical contact and worsened at night, leading to significant sleep disturbance. Previous treatments included physical therapy, nerve blocks, and various pain medications, which resulted in adverse effects. He is currently on gabapentin, which provides limited relief. Discussed increasing the gabapentin dosage to manage the pain better. He expressed concern about increasing medication due to past adverse effects with high doses of pain medications, which  led to hospitalization. - Increase gabapentin dosage - Order blood work  Chronic Obstructive Pulmonary Disease (COPD) with Emphysema; subacute cough COPD with emphysema, with recent exacerbation of symptoms including cough, wheezing, and shortness of breath. He ceased smoking over ten years ago. Current treatment includes albuterol inhaler, but symptoms suggest the need for a daily inhaler to manage the condition better. Discussed the use of Stiolto for daily management, but due to insurance coverage issues, considered alternatives such as Advair and Wixela. - Prescribe Wixela for daily use - Continue albuterol inhaler as needed - Order chest x-ray - Consider pulmonary function test if symptoms persist  General Health Maintenance Due for a colonoscopy as the last one was a few years ago and showed polyps. - Recommend colonoscopy; pt to contact GI clinic to schedule   Return in about 1 month (around 07/15/2023) for recheck/chronic f/u.      There are other unrelated non-urgent complaints/concerns, but due to the busy schedule and the amount of time I've already spent with him, time does not permit me to address these routine issues at today's visit. I've requested another appointment to review these additional issues.  I discussed the assessment and treatment plan with the patient  The patient was provided an opportunity to ask questions and all were answered. The patient agreed with the plan and demonstrated an understanding of the instructions.   The patient was advised to call back or seek an in-person evaluation if the symptoms worsen or if the condition fails to improve as anticipated.  Total time was 50 minutes. That includes chart review before the visit, the actual patient visit, and time spent on documentation after the visit.    Sherlyn Hay, DO  [facility] [phone] (phone) [phone] (fax)  [facility]

## 2023-08-28 ENCOUNTER — Ambulatory Visit: Payer: Medicare HMO

## 2023-08-28 DIAGNOSIS — Z0000 Encounter for general adult medical examination without abnormal findings: Secondary | ICD-10-CM

## 2023-08-28 NOTE — Progress Notes (Signed)
 Subjective:   Patrick Grant is a 68 y.o. who presents for a Medicare Wellness preventive visit.  As a reminder, Annual Wellness Visits don't include a physical exam, and some assessments may be limited, especially if this visit is performed virtually. We may recommend an in-person follow-up visit with your provider if needed.  Visit Complete: Virtual I connected with  Patrick Grant on 08/28/23 by a audio enabled telemedicine application and verified that I am speaking with the correct person using two identifiers.  Patient Location: Home  Provider Location: Home Office  I discussed the limitations of evaluation and management by telemedicine. The patient expressed understanding and agreed to proceed.  Vital Signs: Because this visit was a virtual/telehealth visit, some criteria may be missing or patient reported. Any vitals not documented were not able to be obtained and vitals that have been documented are patient reported.  VideoDeclined- This patient declined Librarian, academic. Therefore the visit was completed with audio only.  Persons Participating in Visit: Patient.  AWV Questionnaire: No: Patient Medicare AWV questionnaire was not completed prior to this visit.  Cardiac Risk Factors include: advanced age (>16men, >36 women);diabetes mellitus;hypertension;dyslipidemia;male gender;sedentary lifestyle     Objective:     Today's Vitals   08/28/23 1313  PainSc: 4    There is no height or weight on file to calculate BMI.     08/28/2023    1:22 PM 08/27/2022    1:23 PM 08/23/2021    1:55 PM 02/23/2020   11:11 AM 05/18/2019    9:17 AM 04/17/2019    9:41 AM 02/16/2019   10:42 AM  Advanced Directives  Does Patient Have a Medical Advance Directive? No No No No No No No  Would patient like information on creating a medical advance directive? No - Patient declined  No - Patient declined No - Patient declined No - Patient declined  Yes (ED -  Information included in AVS)    Current Medications (verified) Outpatient Encounter Medications as of 08/28/2023  Medication Sig   albuterol  (VENTOLIN  HFA) 108 (90 Base) MCG/ACT inhaler Inhale 2 puffs into the lungs every 6 (six) hours as needed for wheezing or shortness of breath.   Aluminum  Chloride Hexahydrate CRYS Topical: Apply once daily at bedtime; once excessive sweating has stopped, may decrease to once or twice weekly, or as needed. Wash treated area in the morning.   amLODipine  (NORVASC ) 5 MG tablet Take 1 tablet (5 mg total) by mouth daily. Take in addition to Hyzaar   fluticasone -salmeterol (WIXELA INHUB ) 100-50 MCG/ACT AEPB Inhale 1 puff into the lungs 2 (two) times daily.   gabapentin  (NEURONTIN ) 800 MG tablet Take 1 tablet (800 mg total) by mouth 3 (three) times daily.   glimepiride  (AMARYL ) 2 MG tablet TAKE 1 TABLET (2 MG TOTAL) BY MOUTH DAILY BEFORE BREAKFAST.   losartan -hydrochlorothiazide (HYZAAR) 100-25 MG tablet Take 1 tablet by mouth daily.   metFORMIN  (GLUCOPHAGE ) 1000 MG tablet TAKE 1 TABLET TWICE DAILY WITH MEALS   metoprolol  succinate (TOPROL -XL) 25 MG 24 hr tablet Take 1 tablet (25 mg total) by mouth daily.   pioglitazone  (ACTOS ) 15 MG tablet Take 1 tablet (15 mg total) by mouth daily.   rosuvastatin  (CRESTOR ) 20 MG tablet Take 1 tablet (20 mg total) by mouth daily.   No facility-administered encounter medications on file as of 08/28/2023.    Allergies (verified) Almond (diagnostic), Fentanyl, and Clove flavoring agent (non-screening)   History: Past Medical History:  Diagnosis Date  BCC (basal cell carcinoma) 05/16/2020   right chest. Nodular, EXC 06/21/20   COPD (chronic obstructive pulmonary disease) (HCC)    CRPS (complex regional pain syndrome)    Diabetes mellitus without complication (HCC)    Dizzy spells    occasional   Hyperlipidemia    Hypertension    Shingles 04/17/2019   Sleep apnea    Uses crutches    or wheelchair   Past Surgical  History:  Procedure Laterality Date   COLONOSCOPY WITH PROPOFOL  N/A 05/18/2019   Procedure: COLONOSCOPY WITH PROPOFOL ;  Surgeon: Luke Salaam, MD;  Location: [facility];  Service: Endoscopy;  Laterality: N/A;  Diabetic - oral meds sleep apnea   POLYPECTOMY N/A 05/18/2019   Procedure: POLYPECTOMY;  Surgeon: Luke Salaam, MD;  Location: [facility];  Service: Endoscopy;  Laterality: N/A;   SPINAL CORD STIMULATOR REMOVAL  2010   DUE TO LOSS OF MUSCLE USE WHEN ACTIVATED   Family History  Problem Relation Age of Onset   CVA Mother    Kidney failure Mother    Diabetes Mother    CVA Father    Kidney failure Father    Colon cancer Father    Ovarian cancer Sister    Diabetes Brother    Healthy Sister    Social History   Socioeconomic History   Marital status: Single    Spouse name: Not on file   Number of children: 0   Years of education: Not on file   Highest education level: Some college, no degree  Occupational History   Occupation: disability  Tobacco Use   Smoking status: Former    Types: Cigars    Quit date: 03/12/2016    Years since quitting: 7.4   Smokeless tobacco: Never  Vaping Use   Vaping status: Never Used  Substance and Sexual Activity   Alcohol use: No    Alcohol/week: 0.0 standard drinks of alcohol   Drug use: No   Sexual activity: Not on file  Other Topics Concern   Not on file  Social History Narrative   Not on file   Social Drivers of Health   Financial Resource Strain: Low Risk  (08/28/2023)   Overall Financial Resource Strain (CARDIA)    Difficulty of Paying Living Expenses: Not very hard  Food Insecurity: No Food Insecurity (08/28/2023)   Hunger Vital Sign    Worried About Running Out of Food in the Last Year: Never true    Ran Out of Food in the Last Year: Never true  Transportation Needs: No Transportation Needs (08/28/2023)   PRAPARE - Administrator, Civil Service (Medical): No    Lack of Transportation (Non-Medical): No   Physical Activity: Inactive (08/28/2023)   Exercise Vital Sign    Days of Exercise per Week: 0 days    Minutes of Exercise per Session: 0 min  Stress: No Stress Concern Present (08/28/2023)   [employer] of Occupational Health - Occupational Stress Questionnaire    Feeling of Stress : Only a little  Social Connections: Socially Isolated (08/28/2023)   Social Connection and Isolation Panel [NHANES]    Frequency of Communication with Friends and Family: Once a week    Frequency of Social Gatherings with Friends and Family: Never    Attends Religious Services: Never    Database administrator or Organizations: No    Attends Banker Meetings: Never    Marital Status: Never married    Tobacco Counseling Counseling given: Not Answered  Clinical Intake:  Pre-visit preparation completed: Yes  Pain : 0-10 Pain Score: 4  Pain Type: Chronic pain Pain Location: Leg Pain Orientation: Left Pain Descriptors / Indicators: Aching, Discomfort, Constant Pain Onset: More than a month ago Pain Frequency: Intermittent     BMI - recorded: 27.6 Nutritional Status: BMI 25 -29 Overweight Nutritional Risks: None Diabetes: Yes CBG done?: No Did pt. bring in CBG monitor from home?: No  Lab Results  Component Value Date   HGBA1C 10.1 (A) 05/17/2023   HGBA1C 9.4 (H) 02/14/2023   HGBA1C 7.6 (H) 11/16/2022     How often do you need to have someone help you when you read instructions, pamphlets, or other written materials from your doctor or pharmacy?: 1 - Never  Interpreter Needed?: No  Information entered by :: Dellie Fergusson, LPN   Activities of Daily Living    08/28/2023    1:26 PM  In your present state of health, do you have any difficulty performing the following activities:  Hearing? 0  Vision? 1  Difficulty concentrating or making decisions? 0  Walking or climbing stairs? 1  Comment LEG PAIN  Dressing or bathing? 0  Doing errands, shopping? 0  Preparing Food  and eating ? N  Using the Toilet? N  In the past six months, have you accidently leaked urine? N  Do you have problems with loss of bowel control? N  Managing your Medications? N  Managing your Finances? N  Housekeeping or managing your Housekeeping? N    Patient Care Team: Carlean Charter, DO as PCP - General (Family Medicine) Cordie Deters, MD as Consulting Physician (Internal Medicine) Pa, [facility]) Luke Salaam, MD as Consulting Physician (Gastroenterology)  I have updated your Care Teams any recent Medical Services you may have received from other providers in the past year.     Assessment:    This is a routine wellness examination for [place].  Hearing/Vision screen Hearing Screening - Comments:: NO AIDS Vision Screening - Comments:: READERS- VISION IN LEFT EYE NOT GOOD BUT DOESN'T HAVE AN EYE MD   Goals Addressed             This Visit's Progress    DIET - REDUCE SUGAR INTAKE         Depression Screen     08/28/2023    1:19 PM 06/14/2023    1:31 PM 05/17/2023   10:59 AM 03/15/2023   10:03 AM 12/28/2022    1:28 PM 08/27/2022    1:18 PM 05/18/2022    9:33 AM  PHQ 2/9 Scores  PHQ - 2 Score 2 3 3 1  0 1 0  PHQ- 9 Score 2 10 14 13 4   0    Fall Risk     08/28/2023    1:25 PM 05/17/2023   10:59 AM 03/15/2023   10:08 AM 03/15/2023   10:03 AM 12/28/2022    1:28 PM  Fall Risk   Falls in the past year? 1 1 1  0 1  Number falls in past yr: 1 1 1  0 1  Comment  3     Injury with Fall? 0 0 1 0 0  Risk for fall due to : History of fall(s);Impaired vision Impaired balance/gait   Impaired mobility  Follow up Falls evaluation completed;Falls prevention discussed Falls evaluation completed       MEDICARE RISK AT HOME:  Medicare Risk at Home Any stairs in or around the home?: Yes If so, are there  any without handrails?: No Home free of loose throw rugs in walkways, pet beds, electrical cords, etc?: Yes Adequate lighting in your home to reduce risk of  falls?: Yes Life alert?: No Use of a cane, walker or w/c?: Yes (CRUTCHES, W/C) Grab bars in the bathroom?: Yes Shower chair or bench in shower?: No Elevated toilet seat or a handicapped toilet?: Yes  TIMED UP AND GO:  Was the test performed?  No  Cognitive Function: 6CIT completed        08/28/2023    1:28 PM 08/27/2022    1:29 PM 02/16/2019   10:56 AM 01/31/2016    1:25 PM  6CIT Screen  What Year? 0 points 0 points 0 points 0 points  What month? 3 points 0 points 0 points 3 points  What time? 0 points 0 points 0 points 0 points  Count back from 20 0 points 0 points 0 points 0 points  Months in reverse 4 points 4 points 4 points 4 points  Repeat phrase 0 points 0 points 2 points 6 points  Total Score 7 points 4 points 6 points 13 points    Immunizations Immunization History  Administered Date(s) Administered   Fluad Trivalent(High Dose 65+) 02/14/2023   Influenza,inj,Quad PF,6+ Mos 01/01/2014, 02/05/2015, 01/31/2016, 01/09/2017, 01/10/2018, 12/15/2019, 01/15/2022   Influenza-Unspecified 01/25/2021   PFIZER(Purple Top)SARS-COV-2 Vaccination 06/22/2019, 07/21/2019   PNEUMOCOCCAL CONJUGATE-20 02/14/2023   Pneumococcal Polysaccharide-23 01/09/2017   Tdap 10/10/2020    Screening Tests Health Maintenance  Topic Date Due   OPHTHALMOLOGY EXAM  Never done   Zoster Vaccines- Shingrix (1 of 2) Never done   Colonoscopy  05/17/2022   COVID-19 Vaccine (3 - 2024-25 season) 11/25/2022   FOOT EXAM  05/19/2023   INFLUENZA VACCINE  10/25/2023   HEMOGLOBIN A1C  11/14/2023   Diabetic kidney evaluation - eGFR measurement  11/16/2023   Diabetic kidney evaluation - Urine ACR  02/14/2024   Medicare Annual Wellness (AWV)  08/27/2024   DTaP/Tdap/Td (2 - Td or Tdap) 10/11/2030   Pneumonia Vaccine 44+ Years old  Completed   Hepatitis C Screening  Completed   HIV Screening  Completed   HPV VACCINES  Aged Out   Meningococcal B Vaccine  Aged Out   Fecal DNA (Cologuard)  Discontinued     Health Maintenance  Health Maintenance Due  Topic Date Due   OPHTHALMOLOGY EXAM  Never done   Zoster Vaccines- Shingrix (1 of 2) Never done   Colonoscopy  05/17/2022   COVID-19 Vaccine (3 - 2024-25 season) 11/25/2022   FOOT EXAM  05/19/2023   Health Maintenance Items Addressed: DECLINES REFERRAL FOR COLONOSCOPY; UP TO DATE PNA & TDAP; NEEDS SHINGRIX & COVID; NEEDS FOOT EXAM & EYE EXAM  Additional Screening:  Vision Screening: Recommended annual ophthalmology exams for early detection of glaucoma and other disorders of the eye. Would you like a referral to an eye doctor? No    Dental Screening: Recommended annual dental exams for proper oral hygiene  Community Resource Referral / Chronic Care Management: CRR required this visit?  No   CCM required this visit?  No   Plan:    I have personally reviewed and noted the following in the patient's chart:   Medical and social history Use of alcohol, tobacco or illicit drugs  Current medications and supplements including opioid prescriptions. Patient is not currently taking opioid prescriptions. Functional ability and status Nutritional status Physical activity Advanced directives List of other physicians Hospitalizations, surgeries, and ER visits in previous 12  months Vitals Screenings to include cognitive, depression, and falls Referrals and appointments  In addition, I have reviewed and discussed with patient certain preventive protocols, quality metrics, and best practice recommendations. A written personalized care plan for preventive services as well as general preventive health recommendations were provided to patient.   Pinky Bright, LPN   03/31/1094   After Visit Summary: (MyChart) Due to this being a telephonic visit, the after visit summary with patients personalized plan was offered to patient via MyChart   Notes: Nothing significant to report at this time.

## 2023-08-28 NOTE — Patient Instructions (Addendum)
 Patrick Grant , Thank you for taking time out of your busy schedule to complete your Annual Wellness Visit with me. I enjoyed our conversation and look forward to speaking with you again next year. I, as well as your care team,  appreciate your ongoing commitment to your health goals. Please review the following plan we discussed and let me know if I can assist you in the future.   Follow up Visits: Next Medicare AWV with our clinical staff:   09/08/24 @ 11:30 AM BY PHONE Have you seen your provider in the last 6 months (3 months if uncontrolled diabetes)? Yes   Clinician Recommendations:  Aim for 30 minutes of exercise or brisk walking, 6-8 glasses of water , and 5 servings of fruits and vegetables each day. TAKE CARE!      This is a list of the screening recommended for you and due dates:  Health Maintenance  Topic Date Due   Eye exam for diabetics  Never done   Zoster (Shingles) Vaccine (1 of 2) Never done   Colon Cancer Screening  05/17/2022   COVID-19 Vaccine (3 - 2024-25 season) 11/25/2022   Complete foot exam   05/19/2023   Flu Shot  10/25/2023   Hemoglobin A1C  11/14/2023   Yearly kidney function blood test for diabetes  11/16/2023   Yearly kidney health urinalysis for diabetes  02/14/2024   Medicare Annual Wellness Visit  08/27/2024   DTaP/Tdap/Td vaccine (2 - Td or Tdap) 10/11/2030   Pneumonia Vaccine  Completed   Hepatitis C Screening  Completed   HIV Screening  Completed   HPV Vaccine  Aged Out   Meningitis B Vaccine  Aged Out   Cologuard (Stool DNA test)  Discontinued    Advanced directives: (ACP Link)Information on Advanced Care Planning can be found at [place]  Secretary of [facility] Advance Health Care Directives Advance Health Care Directives. [email or web address]  Advance Care Planning is important because it:  [x]  Makes sure you receive the medical care that is consistent with your values, goals, and preferences  [x]  It provides guidance to your family and loved ones and  reduces their decisional burden about whether or not they are making the right decisions based on your wishes.  Follow the link provided in your after visit summary or read over the paperwork we have mailed to you to help you started getting your Advance Directives in place. If you need assistance in completing these, please reach out to us  so that we can help you!

## 2023-10-09 ENCOUNTER — Telehealth: Payer: Self-pay | Admitting: Family Medicine

## 2023-10-09 NOTE — Telephone Encounter (Signed)
 [facility] is asking for refills of Rosuvastatin  20 mg. and Losartan /hydrochlorothiazide 100-25 mg.  #90 with 3 RF

## 2023-10-10 ENCOUNTER — Other Ambulatory Visit: Payer: Self-pay

## 2023-10-10 DIAGNOSIS — I152 Hypertension secondary to endocrine disorders: Secondary | ICD-10-CM

## 2023-10-10 MED ORDER — ROSUVASTATIN CALCIUM 20 MG PO TABS: 20.0000 mg | ORAL_TABLET | Freq: Every day | ORAL | 3 refills | Status: AC

## 2023-10-10 MED ORDER — LOSARTAN POTASSIUM-HCTZ 100-25 MG PO TABS: 1.0000 | ORAL_TABLET | Freq: Every day | ORAL | 3 refills | Status: AC

## 2023-10-10 NOTE — Telephone Encounter (Signed)
 Medication have been sent.

## 2023-10-16 ENCOUNTER — Telehealth: Payer: Self-pay | Admitting: Family Medicine

## 2023-10-16 ENCOUNTER — Other Ambulatory Visit: Payer: Self-pay

## 2023-10-16 DIAGNOSIS — E1100 Type 2 diabetes mellitus with hyperosmolarity without nonketotic hyperglycemic-hyperosmolar coma (NKHHC): Secondary | ICD-10-CM

## 2023-10-16 MED ORDER — METFORMIN HCL 1000 MG PO TABS: ORAL_TABLET | ORAL | 2 refills | Status: AC

## 2023-10-16 NOTE — Telephone Encounter (Signed)
 [facility] faxed refill request for the following medications:    metFORMIN  (GLUCOPHAGE ) 1000 MG tablet    Please advise

## 2023-10-16 NOTE — Telephone Encounter (Signed)
 Medication has been refilled.

## 2023-11-04 ENCOUNTER — Telehealth: Payer: Self-pay | Admitting: Family Medicine

## 2023-11-04 ENCOUNTER — Other Ambulatory Visit: Payer: Self-pay

## 2023-11-04 NOTE — Telephone Encounter (Signed)
 [facility] faxed refill request for the following medications:   amLODipine  (NORVASC ) 5 MG tablet    Please advise.

## 2023-11-05 MED ORDER — AMLODIPINE BESYLATE 5 MG PO TABS: 5.0000 mg | ORAL_TABLET | Freq: Every day | ORAL | 0 refills | Status: DC

## 2023-12-18 ENCOUNTER — Telehealth: Payer: Self-pay | Admitting: Family Medicine

## 2023-12-18 ENCOUNTER — Other Ambulatory Visit: Payer: Self-pay

## 2023-12-18 DIAGNOSIS — E1140 Type 2 diabetes mellitus with diabetic neuropathy, unspecified: Secondary | ICD-10-CM

## 2023-12-18 MED ORDER — GLIMEPIRIDE 2 MG PO TABS: 2.0000 mg | ORAL_TABLET | Freq: Every day | ORAL | 3 refills | Status: DC

## 2023-12-18 NOTE — Telephone Encounter (Signed)
 We received refill request from [facility]. Phone: [phone] Glimepiride  2MG  Tab QTY:90

## 2023-12-27 ENCOUNTER — Other Ambulatory Visit: Payer: Self-pay

## 2023-12-27 ENCOUNTER — Telehealth: Payer: Self-pay | Admitting: Family Medicine

## 2023-12-27 DIAGNOSIS — E1140 Type 2 diabetes mellitus with diabetic neuropathy, unspecified: Secondary | ICD-10-CM

## 2023-12-27 DIAGNOSIS — I152 Hypertension secondary to endocrine disorders: Secondary | ICD-10-CM

## 2023-12-27 MED ORDER — PIOGLITAZONE HCL 15 MG PO TABS: 15.0000 mg | ORAL_TABLET | Freq: Every day | ORAL | 3 refills | Status: AC

## 2023-12-27 MED ORDER — METOPROLOL SUCCINATE ER 25 MG PO TB24: 25.0000 mg | ORAL_TABLET | Freq: Every day | ORAL | 3 refills | Status: AC

## 2023-12-27 NOTE — Telephone Encounter (Signed)
 Converted into a refill request

## 2023-12-27 NOTE — Telephone Encounter (Addendum)
 [facility] faxed refill request for the following medications:  metoprolol  succinate (TOPROL -XL) 25 MG 24 hr tablet   pioglitazone  (ACTOS ) 15 MG tablet    Please advise.

## 2023-12-31 NOTE — Progress Notes (Signed)
 Patrick Grant                                          MRN: [id number]   12/31/2023   The VBCI Quality Team Specialist reviewed this patient medical record for the purposes of chart review for care gap closure. The following were reviewed: chart review for care gap closure-kidney health evaluation for diabetes:eGFR  and uACR.    VBCI Quality Team

## 2024-01-02 NOTE — Progress Notes (Signed)
 Patrick Grant                                          MRN: [id number]   01/02/2024   The VBCI Quality Team Specialist reviewed this patient medical record for the purposes of chart review for care gap closure. The following were reviewed: chart review for care gap closure-glycemic status assessment.    VBCI Quality Team

## 2024-01-10 ENCOUNTER — Telehealth: Payer: Self-pay

## 2024-01-16 NOTE — Progress Notes (Signed)
 LAREN WHALING                                          MRN: [id number]   01/16/2024   The VBCI Quality Team Specialist reviewed this patient medical record for the purposes of chart review for care gap closure. The following were reviewed: chart review for care gap closure-diabetic eye exam.    VBCI Quality Team

## 2024-01-21 ENCOUNTER — Other Ambulatory Visit: Payer: Self-pay | Admitting: Family Medicine

## 2024-02-07 NOTE — Progress Notes (Signed)
 Pharmacy Quality Measure Review  This patient is appearing on a report for being at risk of failing the Glycemic Status Assessment in Diabetes measure this calendar year.   Last documented A1c 9.4% on 02/14/23  Will route to admin team to get rescheduled with PCP.  Royce Sciara E. Marsh, PharmD, CPP Clinical Pharmacist [facility] [phone]

## 2024-02-11 ENCOUNTER — Telehealth: Payer: Self-pay | Admitting: Family Medicine

## 2024-02-11 DIAGNOSIS — G90522 Complex regional pain syndrome I of left lower limb: Secondary | ICD-10-CM

## 2024-02-11 NOTE — Telephone Encounter (Signed)
 Patient is asking for a refill on Gabapentin .   He said winter is a horrible time for the pain to be so bad for him.   He said the cold and dampness makes the pain so bad he can't sleep.

## 2024-02-17 MED ORDER — GABAPENTIN 800 MG PO TABS: 800.0000 mg | ORAL_TABLET | Freq: Three times a day (TID) | ORAL | 0 refills | Status: DC

## 2024-02-17 NOTE — Addendum Note (Signed)
 Addended by: DONZELLA DOMINO on: 02/17/2024 12:43 PM   Modules accepted: Orders

## 2024-03-30 ENCOUNTER — Encounter: Payer: Self-pay | Admitting: Family Medicine

## 2024-03-30 ENCOUNTER — Ambulatory Visit: Admitting: Family Medicine

## 2024-03-30 VITALS — BP 129/72 | HR 85 | Temp 97.7°F | Ht 73.0 in | Wt 218.6 lb

## 2024-03-30 DIAGNOSIS — E785 Hyperlipidemia, unspecified: Secondary | ICD-10-CM | POA: Diagnosis not present

## 2024-03-30 DIAGNOSIS — J439 Emphysema, unspecified: Secondary | ICD-10-CM

## 2024-03-30 DIAGNOSIS — G5772 Causalgia of left lower limb: Secondary | ICD-10-CM

## 2024-03-30 DIAGNOSIS — E1159 Type 2 diabetes mellitus with other circulatory complications: Secondary | ICD-10-CM | POA: Diagnosis not present

## 2024-03-30 DIAGNOSIS — T782XXS Anaphylactic shock, unspecified, sequela: Secondary | ICD-10-CM

## 2024-03-30 DIAGNOSIS — E1169 Type 2 diabetes mellitus with other specified complication: Secondary | ICD-10-CM

## 2024-03-30 DIAGNOSIS — I152 Hypertension secondary to endocrine disorders: Secondary | ICD-10-CM | POA: Diagnosis not present

## 2024-03-30 DIAGNOSIS — E1100 Type 2 diabetes mellitus with hyperosmolarity without nonketotic hyperglycemic-hyperosmolar coma (NKHHC): Secondary | ICD-10-CM | POA: Diagnosis not present

## 2024-03-30 DIAGNOSIS — Z23 Encounter for immunization: Secondary | ICD-10-CM

## 2024-03-30 MED ORDER — BLOOD GLUCOSE TEST VI STRP: 1.0000 | ORAL_STRIP | Freq: Every day | 3 refills | Status: AC

## 2024-03-30 MED ORDER — BLOOD GLUCOSE MONITORING SUPPL DEVI: 1.0000 | Freq: Every day | 0 refills | Status: AC

## 2024-03-30 MED ORDER — EPINEPHRINE 0.3 MG/0.3ML IJ SOAJ: 0.3000 mg | INTRAMUSCULAR | 1 refills | Status: AC | PRN

## 2024-03-30 MED ORDER — AMITRIPTYLINE HCL 25 MG PO TABS: 25.0000 mg | ORAL_TABLET | Freq: Every day | ORAL | 0 refills | Status: AC

## 2024-03-30 MED ORDER — LANCETS MISC: 1.0000 | 0 refills | Status: AC

## 2024-03-30 MED ORDER — LANCET DEVICE MISC: 1.0000 | Freq: Every day | 0 refills | Status: AC

## 2024-03-30 MED ORDER — GABAPENTIN 800 MG PO TABS: 800.0000 mg | ORAL_TABLET | Freq: Three times a day (TID) | ORAL | 0 refills | Status: AC

## 2024-03-30 NOTE — Progress Notes (Signed)
 "     Established patient visit   Patient: Patrick Grant   DOB: 1955-12-11   69 y.o. Male  MRN: [id number] Visit Date: 03/30/2024  Today's healthcare provider: LAURAINE LOISE BUOY, DO   Chief Complaint  Patient presents with   Diabetes    Patient is here today to follow up on diabetes, reports that he hasn't been monitoring his glucose lately.  Stated he ran out of strips and they don't deliver them for him.  States his sister is picking up some for him today, he believes he has been without them for 8 days.  Flu- yes   Subjective    Diabetes Pertinent negatives for hypoglycemia include no headaches. Pertinent negatives for diabetes include no chest pain and no weakness.    Patrick Grant is a 70 year old male with CRPS who presents for management of his condition and medication refills.  He has ongoing issues with Complex Regional Pain Syndrome (CRPS) affecting his left foot, characterized by significant pain, swelling, and discoloration. He experiences electrical shock-like sensations and burning pain that can radiate up his leg, particularly at night, disrupting his sleep. He manages symptoms by keeping his foot elevated and using a heating pad.  His CRPS began following a work-related injury where he stepped on a rubber mat that was improperly placed, leading to a fall that resulted in his foot being trapped and injured. Following the injury, his foot began to swell and leak fluid, and it took nearly a month for a diagnosis of CRPS to be made after seeing multiple specialists.  He is currently taking gabapentin  for pain management but reports it is not very effective. He mentions that taking more than three doses makes him drowsy. He has previously been on a range of medications including fentanyl patches and morphine, which led to significant weight gain and a dangerously high A1c level.  He has difficulty obtaining transportation for medical appointments and relies on  friends for rides, as his usual caregiver, Santana, is undergoing treatment for cancer and is unable to assist him. He does not have access to Medicaid ride services and finds Medicare's options limited and inconvenient.  No recent issues with breathing, fever, or chills, and he has not needed to use his albuterol  inhaler frequently. He has a history of pneumonia but has not experienced breathing difficulties since quitting smoking.      Medications: Show/hide medication list[1]  Review of Systems  Constitutional:  Negative for chills and fever.  Respiratory: Negative.  Negative for cough, shortness of breath and wheezing.   Cardiovascular:  Negative for chest pain, palpitations and leg swelling.  Gastrointestinal:  Negative for nausea and vomiting.  Neurological:  Negative for weakness and headaches.        Objective    BP 129/72 (BP Location: Left Arm, Patient Position: Sitting, Cuff Size: Normal)   Pulse 85   Temp 97.7 F (36.5 C) (Oral)   Ht 6' 1 (1.854 m)   Wt 218 lb 9.6 oz (99.2 kg)   SpO2 97%   BMI 28.84 kg/m     Physical Exam Constitutional:      Appearance: Normal appearance.  HENT:     Head: Normocephalic and atraumatic.  Eyes:     General: No scleral icterus.    Extraocular Movements: Extraocular movements intact.     Conjunctiva/sclera: Conjunctivae normal.  Cardiovascular:     Rate and Rhythm: Normal rate and regular rhythm.     Pulses: Normal  pulses.     Heart sounds: Normal heart sounds.  Pulmonary:     Effort: Pulmonary effort is normal. No respiratory distress.     Breath sounds: Wheezing (mild, upper lobes bilaterally) present.  Abdominal:     General: Bowel sounds are normal.     Palpations: Abdomen is soft.  Musculoskeletal:     Right lower leg: No edema.     Left lower leg: No edema.  Skin:    General: Skin is warm and dry.  Neurological:     Mental Status: He is alert and oriented to person, place, and time. Mental status is at  baseline.  Psychiatric:        Mood and Affect: Mood normal.        Behavior: Behavior normal.      No results found for any visits on 03/30/24.  Assessment & Plan    Type 2 diabetes mellitus with hyperosmolarity without coma, without long-term current use of insulin (HCC) -     Blood Glucose Monitoring Suppl; 1 each by Does not apply route daily before breakfast. May substitute to any manufacturer covered by patient's insurance.  Dispense: 1 each; Refill: 0 -     Blood Glucose Test; 1 each by In Vitro route daily before breakfast. May substitute to any manufacturer covered by patient's insurance.  Dispense: 100 strip; Refill: 3 -     Lancet Device; 1 each by Does not apply route daily before breakfast. May substitute to any manufacturer covered by patient's insurance.  Dispense: 1 each; Refill: 0 -     Lancets; 1 each by Does not apply route as directed. Dispense based on patient and insurance preference. Use up to four times daily as directed. (FOR ICD-10 E10.9, E11.9).  Dispense: 100 each; Refill: 0 -     POCT glycosylated hemoglobin (Hb A1C) -     Microalbumin / creatinine urine ratio  Need for influenza vaccination -     Flu vaccine HIGH DOSE PF(Fluzone Trivalent)  Hyperlipidemia associated with type 2 diabetes mellitus (HCC)  Hypertension associated with diabetes (HCC)  Pulmonary emphysema (HCC)  Complex regional pain syndrome type 2 of left lower extremity -     Gabapentin ; Take 1 tablet (800 mg total) by mouth 3 (three) times daily.  Dispense: 270 tablet; Refill: 0 -     Amitriptyline  HCl; Take 1 tablet (25 mg total) by mouth at bedtime.  Dispense: 90 tablet; Refill: 0  Anaphylaxis, sequela -     EPINEPHrine ; Inject 0.3 mg into the muscle as needed for anaphylaxis.  Dispense: 1 each; Refill: 1      Type 2 diabetes mellitus with hyperosmolarity without coma, without long-term current use of insulin Recent issues obtaining glucose test strips. No recent hypoglycemic  episodes reported. A1c needs to be checked to assess current glycemic control. - Ordered A1c. - Attempted to obtain a urine sample for uACR, which was unsuccessful.  Complex regional pain syndrome type 2 of left lower extremity Chronic pain in the left leg, progressively worsening over the last ten years, described as a burning sensation with tingling, swelling, throbbing pain, and electrical shock sensations. Symptoms exacerbated by cold air, physical contact and worsened at night, leading to significant sleep disturbance. Previous treatments included physical therapy, nerve blocks, and various pain medications, which resulted in adverse effects. He is currently on gabapentin , which provides limited relief. Discussed increasing the gabapentin  dosage to manage the pain better.  - Increase gabapentin  dosage as noted.  Hypertension associated  with diabetes Chronic, stable on losartan -hydrochlorothiazide, amlodipine  and metoprolol  as noted above.  No changes today.  Hyperlipidemia associated with type 2 diabetes mellitus Chronic, stable on rosuvastatin  20 mg daily.  No changes today.  Pulmonary emphysema Chronic, stable.  Patient denies any recent need for his inhaler. No acute concerns.  Continue to monitor.    General health maintenance Discussion of vaccinations including shingles, flu, and COVID booster. He has not received the shingles vaccine and is due for a COVID booster. - Recommended shingles vaccine at the pharmacy. - Recommended COVID booster at the pharmacy.    Return in about 3 months (around 06/28/2024) for Chronic f/u, DM, CRPS and in 6 months for [facility] w/next provider.      I discussed the assessment and treatment plan with the patient  The patient was provided an opportunity to ask questions and all were answered. The patient agreed with the plan and demonstrated an understanding of the instructions.   The patient was advised to call back or seek an in-person evaluation if the  symptoms worsen or if the condition fails to improve as anticipated.    LAURAINE LOISE BUOY, DO  [facility] [place] [phone] (phone) [phone] (fax)  [facility]     [1]  Outpatient Medications Prior to Visit  Medication Sig   albuterol  (VENTOLIN  HFA) 108 (90 Base) MCG/ACT inhaler Inhale 2 puffs into the lungs every 6 (six) hours as needed for wheezing or shortness of breath.   Aluminum  Chloride Hexahydrate CRYS Topical: Apply once daily at bedtime; once excessive sweating has stopped, may decrease to once or twice weekly, or as needed. Wash treated area in the morning.   amLODipine  (NORVASC ) 5 MG tablet TAKE 1 TABLET EVERY DAY IN ADDITION TO HYZAAR   fluticasone -salmeterol (WIXELA INHUB ) 100-50 MCG/ACT AEPB Inhale 1 puff into the lungs 2 (two) times daily.   glimepiride  (AMARYL ) 2 MG tablet Take 1 tablet (2 mg total) by mouth daily before breakfast.   losartan -hydrochlorothiazide (HYZAAR) 100-25 MG tablet Take 1 tablet by mouth daily.   metFORMIN  (GLUCOPHAGE ) 1000 MG tablet TAKE 1 TABLET TWICE DAILY WITH MEALS   metoprolol  succinate (TOPROL -XL) 25 MG 24 hr tablet Take 1 tablet (25 mg total) by mouth daily.   pioglitazone  (ACTOS ) 15 MG tablet Take 1 tablet (15 mg total) by mouth daily.   rosuvastatin  (CRESTOR ) 20 MG tablet Take 1 tablet (20 mg total) by mouth daily.   [DISCONTINUED] gabapentin  (NEURONTIN ) 800 MG tablet Take 1 tablet (800 mg total) by mouth 3 (three) times daily.   No facility-administered medications prior to visit.   "

## 2024-03-30 NOTE — Patient Instructions (Signed)
 Recommended vaccine at the pharmacy: Shingrix (shingles)

## 2024-03-31 LAB — LIPID PANEL

## 2024-04-01 LAB — MICROALBUMIN / CREATININE URINE RATIO

## 2024-04-02 LAB — LIPID PANEL
Chol/HDL Ratio: 2.4 ratio (ref 0.0–5.0)
Cholesterol, Total: 117 mg/dL (ref 100–199)
HDL: 48 mg/dL
LDL Chol Calc (NIH): 49 mg/dL (ref 0–99)
Triglycerides: 110 mg/dL (ref 0–149)
VLDL Cholesterol Cal: 20 mg/dL (ref 5–40)

## 2024-04-02 LAB — COMPREHENSIVE METABOLIC PANEL WITH GFR
ALT: 18 IU/L (ref 0–44)
AST: 20 IU/L (ref 0–40)
Albumin: 5 g/dL — ABNORMAL HIGH (ref 3.9–4.9)
Alkaline Phosphatase: 73 IU/L (ref 47–123)
BUN/Creatinine Ratio: 18 (ref 10–24)
BUN: 19 mg/dL (ref 8–27)
Bilirubin Total: 0.4 mg/dL (ref 0.0–1.2)
CO2: 21 mmol/L (ref 20–29)
Calcium: 10 mg/dL (ref 8.6–10.2)
Chloride: 100 mmol/L (ref 96–106)
Creatinine, Ser: 1.03 mg/dL (ref 0.76–1.27)
Globulin, Total: 2.1 g/dL (ref 1.5–4.5)
Glucose: 126 mg/dL — ABNORMAL HIGH (ref 70–99)
Potassium: 4.5 mmol/L (ref 3.5–5.2)
Sodium: 138 mmol/L (ref 134–144)
Total Protein: 7.1 g/dL (ref 6.0–8.5)
eGFR: 80 mL/min/1.73

## 2024-04-02 LAB — HEMOGLOBIN A1C
Est. average glucose Bld gHb Est-mCnc: 169 mg/dL
Hgb A1c MFr Bld: 7.5 % — ABNORMAL HIGH (ref 4.8–5.6)

## 2024-04-02 LAB — VITAMIN B12: Vitamin B-12: 276 pg/mL (ref 232–1245)

## 2024-04-02 LAB — MICROALBUMIN / CREATININE URINE RATIO

## 2024-04-02 LAB — VITAMIN D 25 HYDROXY (VIT D DEFICIENCY, FRACTURES): Vit D, 25-Hydroxy: 13.2 ng/mL — ABNORMAL LOW (ref 30.0–100.0)

## 2024-04-02 LAB — TSH RFX ON ABNORMAL TO FREE T4: TSH: 1.04 u[IU]/mL (ref 0.450–4.500)

## 2024-04-15 ENCOUNTER — Ambulatory Visit: Payer: Self-pay | Admitting: Family Medicine

## 2024-04-15 DIAGNOSIS — E559 Vitamin D deficiency, unspecified: Secondary | ICD-10-CM

## 2024-04-15 DIAGNOSIS — E1100 Type 2 diabetes mellitus with hyperosmolarity without nonketotic hyperglycemic-hyperosmolar coma (NKHHC): Secondary | ICD-10-CM

## 2024-04-15 MED ORDER — VITAMIN D (ERGOCALCIFEROL) 1.25 MG (50000 UNIT) PO CAPS: 50000.0000 [IU] | ORAL_CAPSULE | ORAL | 1 refills | Status: AC

## 2024-04-15 MED ORDER — GLIMEPIRIDE 4 MG PO TABS: 4.0000 mg | ORAL_TABLET | Freq: Every day | ORAL | 3 refills | Status: AC

## 2024-04-23 ENCOUNTER — Other Ambulatory Visit: Payer: Self-pay | Admitting: Family Medicine

## 2024-06-29 ENCOUNTER — Ambulatory Visit: Admitting: Family Medicine

## 2024-09-08 ENCOUNTER — Ambulatory Visit

## 2024-09-28 ENCOUNTER — Encounter
